# Patient Record
Sex: Female | Born: 1970
Health system: Southern US, Community
[De-identification: ages and names within clinical notes are randomized; demographics above are authoritative.]

## PROBLEM LIST (undated history)

## (undated) DIAGNOSIS — E739 Lactose intolerance, unspecified: Secondary | ICD-10-CM

## (undated) DIAGNOSIS — I10 Essential (primary) hypertension: Secondary | ICD-10-CM

## (undated) DIAGNOSIS — M5136 Other intervertebral disc degeneration, lumbar region: Secondary | ICD-10-CM

## (undated) DIAGNOSIS — K589 Irritable bowel syndrome without diarrhea: Secondary | ICD-10-CM

## (undated) DIAGNOSIS — M51369 Other intervertebral disc degeneration, lumbar region without mention of lumbar back pain or lower extremity pain: Secondary | ICD-10-CM

## (undated) DIAGNOSIS — R0683 Snoring: Secondary | ICD-10-CM

## (undated) DIAGNOSIS — G4700 Insomnia, unspecified: Secondary | ICD-10-CM

## (undated) DIAGNOSIS — K056 Periodontal disease, unspecified: Secondary | ICD-10-CM

## (undated) DIAGNOSIS — M21619 Bunion of unspecified foot: Secondary | ICD-10-CM

## (undated) DIAGNOSIS — M1990 Unspecified osteoarthritis, unspecified site: Secondary | ICD-10-CM

## (undated) DIAGNOSIS — E7800 Pure hypercholesterolemia, unspecified: Secondary | ICD-10-CM

## (undated) DIAGNOSIS — N200 Calculus of kidney: Secondary | ICD-10-CM

## (undated) DIAGNOSIS — M549 Dorsalgia, unspecified: Secondary | ICD-10-CM

## (undated) HISTORY — DX: Other intervertebral disc degeneration, lumbar region without mention of lumbar back pain or lower extremity pain: M51.369

## (undated) HISTORY — DX: Lactose intolerance, unspecified: E73.9

## (undated) HISTORY — DX: Periodontal disease, unspecified: K05.6

## (undated) HISTORY — DX: Dorsalgia, unspecified: M54.9

## (undated) HISTORY — DX: Pure hypercholesterolemia, unspecified: E78.00

## (undated) HISTORY — DX: Irritable bowel syndrome, unspecified: K58.9

## (undated) HISTORY — DX: Insomnia, unspecified: G47.00

## (undated) HISTORY — DX: Snoring: R06.83

## (undated) HISTORY — DX: Unspecified osteoarthritis, unspecified site: M19.90

## (undated) HISTORY — DX: Calculus of kidney: N20.0

## (undated) HISTORY — DX: Bunion of unspecified foot: M21.619

## (undated) HISTORY — DX: Other intervertebral disc degeneration, lumbar region: M51.36

---

## 1997-12-21 HISTORY — PX: BUNIONECTOMY: SHX129

## 1998-12-05 ENCOUNTER — Ambulatory Visit (HOSPITAL_BASED_OUTPATIENT_CLINIC_OR_DEPARTMENT_OTHER): Admission: RE | Admit: 1998-12-05 | Discharge: 1998-12-05 | Payer: Self-pay | Admitting: Orthopedic Surgery

## 1999-11-07 ENCOUNTER — Other Ambulatory Visit: Admission: RE | Admit: 1999-11-07 | Discharge: 1999-11-07 | Payer: Self-pay | Admitting: Family Medicine

## 2000-01-02 ENCOUNTER — Encounter: Admission: RE | Admit: 2000-01-02 | Discharge: 2000-01-29 | Payer: Self-pay | Admitting: Family Medicine

## 2000-04-15 ENCOUNTER — Other Ambulatory Visit: Admission: RE | Admit: 2000-04-15 | Discharge: 2000-04-15 | Payer: Self-pay | Admitting: Obstetrics & Gynecology

## 2000-12-28 ENCOUNTER — Other Ambulatory Visit: Admission: RE | Admit: 2000-12-28 | Discharge: 2000-12-28 | Payer: Self-pay | Admitting: Family Medicine

## 2002-04-27 ENCOUNTER — Other Ambulatory Visit: Admission: RE | Admit: 2002-04-27 | Discharge: 2002-04-27 | Payer: Self-pay | Admitting: Family Medicine

## 2003-04-30 ENCOUNTER — Other Ambulatory Visit: Admission: RE | Admit: 2003-04-30 | Discharge: 2003-04-30 | Payer: Self-pay | Admitting: Family Medicine

## 2004-05-21 ENCOUNTER — Other Ambulatory Visit: Admission: RE | Admit: 2004-05-21 | Discharge: 2004-05-21 | Payer: Self-pay | Admitting: Family Medicine

## 2004-05-23 ENCOUNTER — Encounter: Admission: RE | Admit: 2004-05-23 | Discharge: 2004-05-23 | Payer: Self-pay | Admitting: Family Medicine

## 2004-06-09 ENCOUNTER — Encounter: Admission: RE | Admit: 2004-06-09 | Discharge: 2004-06-09 | Payer: Self-pay | Admitting: Family Medicine

## 2005-11-19 IMAGING — US US RETROPERITONEAL COMPLETE
1 series · 14 of 25 positions shown · non-contrast
Comparison: none

CLINICAL DATA: Microhematuria. 
 RENAL/ULTRASOUND: 
 Both kidneys are within normal limits in size and parenchymal echogenicity. There is no evidence of renal parenchymal lesions. There is no evidence of hydronephrosis.  Right kidney measures 11.4cm long with the left kidney 11.1cm long

 The urinary bladder is unremarkable in appearance for the degree of bladder filling. 
 IMPRESSION
 Normal study.

[Series 1: unknown · 0.26mm/px · 14 of 28 slices shown]
[im 1/28]
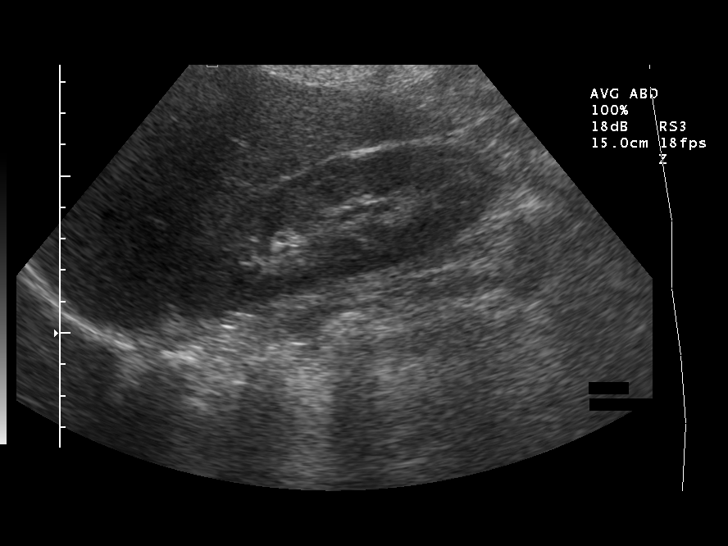
[im 3/28]
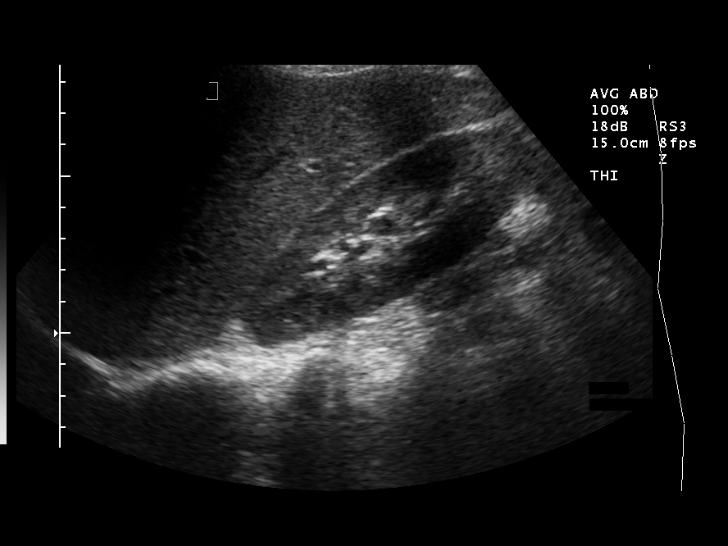
[im 5/28]
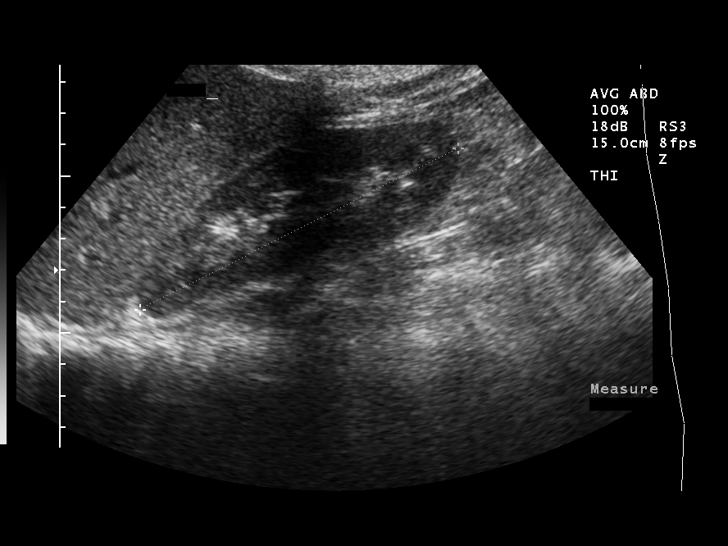
[im 7/28]
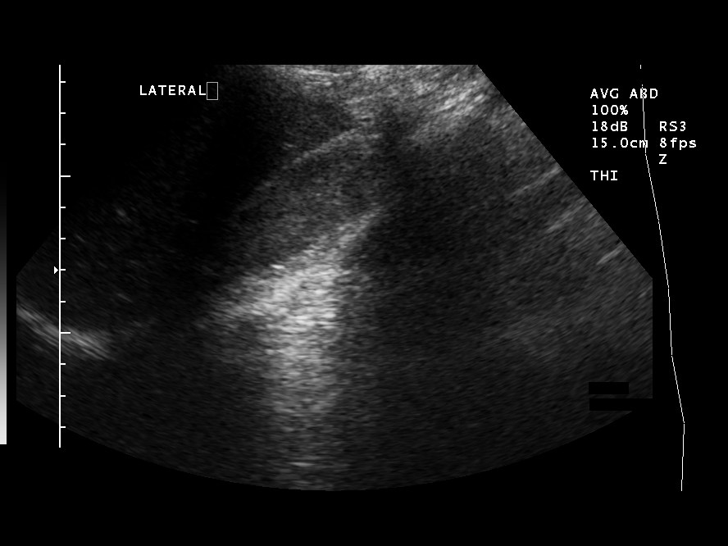
[im 10/28]
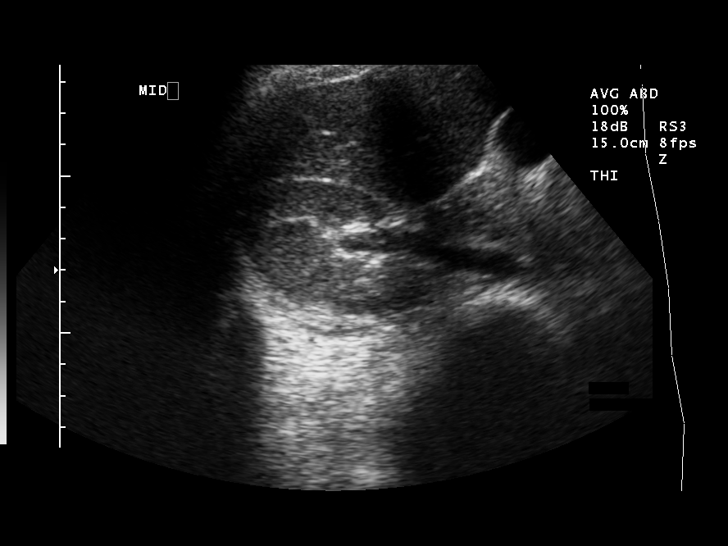
[im 11/28]
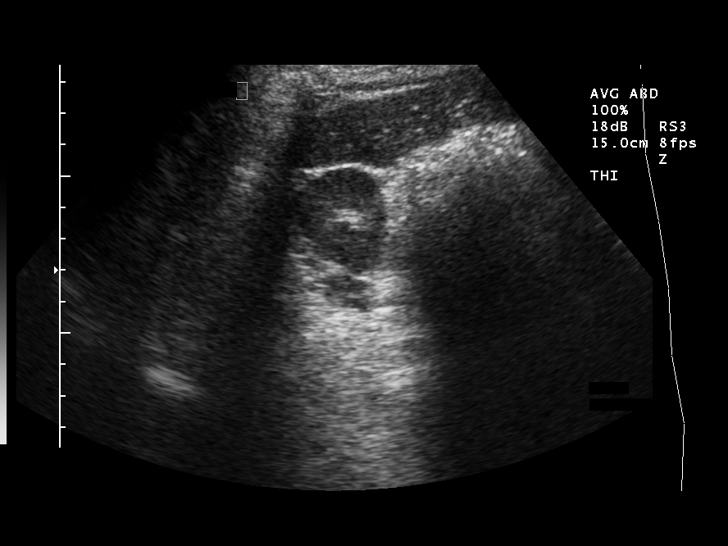
[im 13/28]
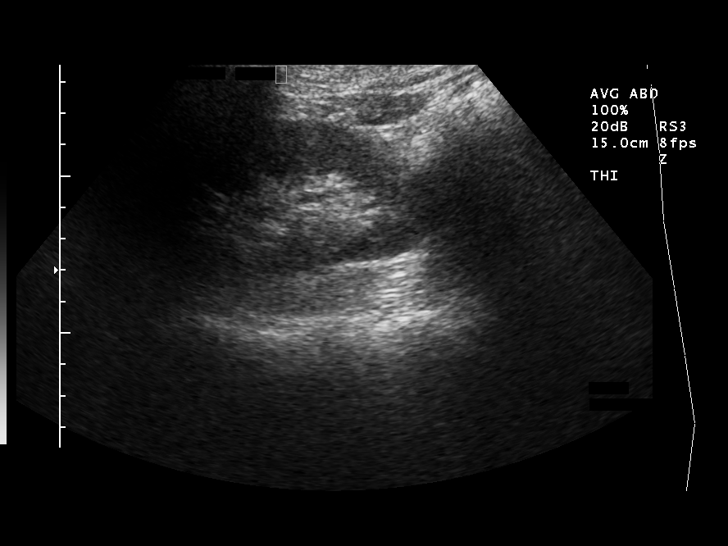
[im 15/28]
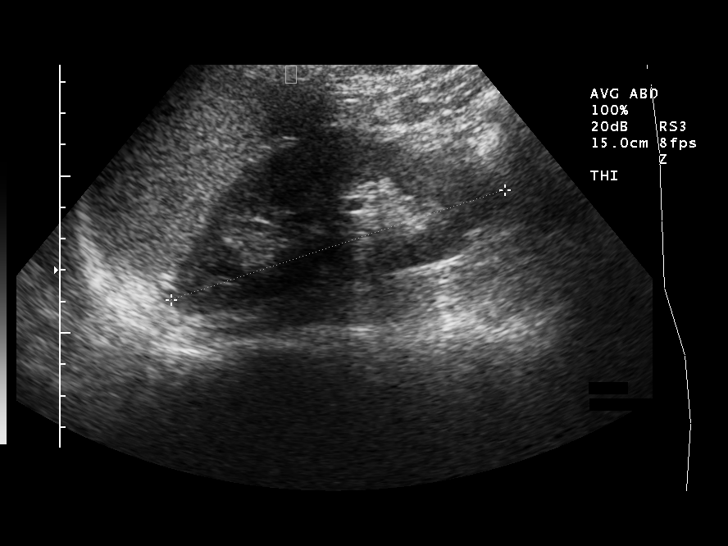
[im 17/28]
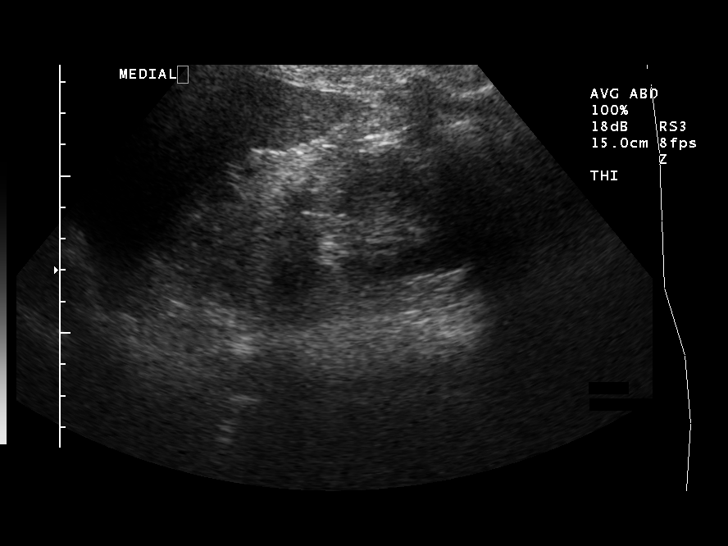
[im 19/28]
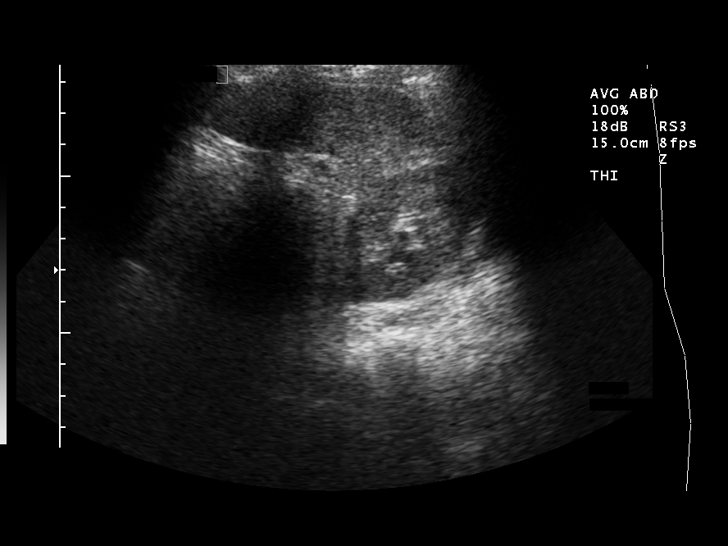
[im 21/28]
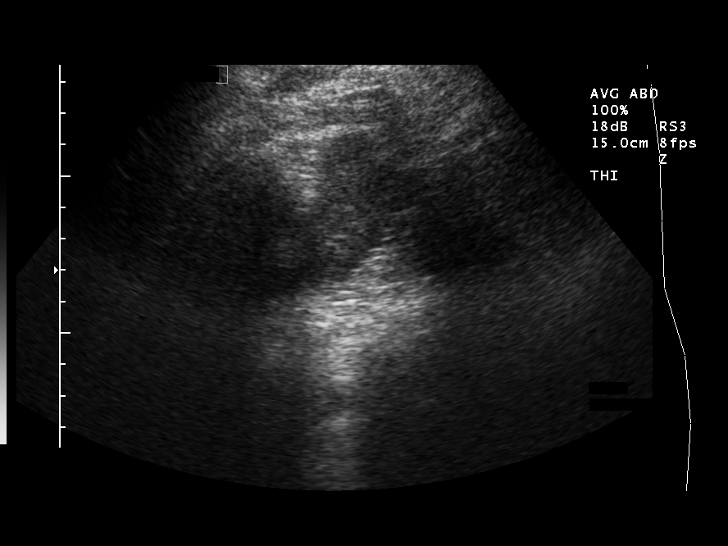
[im 23/28]
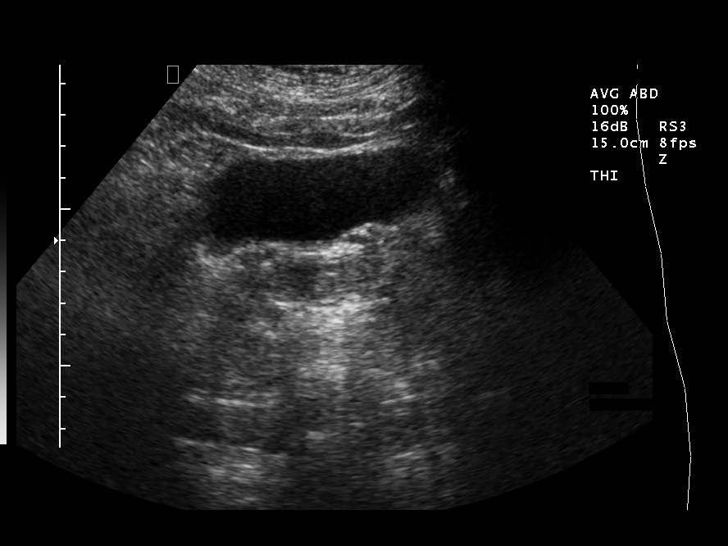
[im 25/28]
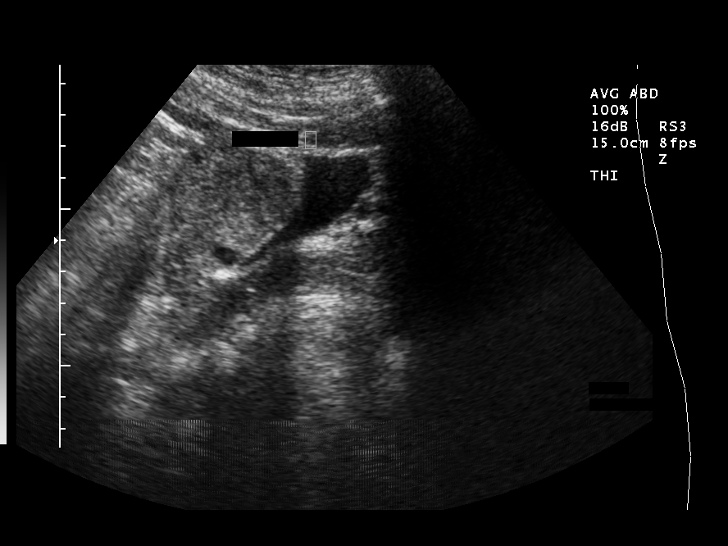
[im 28/28]
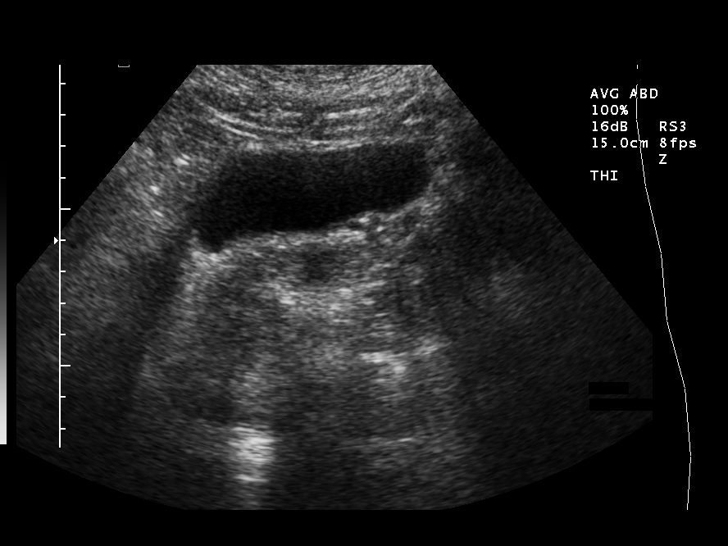

[14 of 25 positions shown; findings below may reference images not displayed]

## 2008-11-05 ENCOUNTER — Encounter (INDEPENDENT_AMBULATORY_CARE_PROVIDER_SITE_OTHER): Payer: Self-pay | Admitting: *Deleted

## 2008-12-31 ENCOUNTER — Other Ambulatory Visit: Admission: RE | Admit: 2008-12-31 | Discharge: 2008-12-31 | Payer: Self-pay | Admitting: Family Medicine

## 2008-12-31 ENCOUNTER — Ambulatory Visit: Payer: Self-pay | Admitting: Family Medicine

## 2008-12-31 ENCOUNTER — Encounter: Payer: Self-pay | Admitting: Family Medicine

## 2008-12-31 DIAGNOSIS — D239 Other benign neoplasm of skin, unspecified: Secondary | ICD-10-CM | POA: Insufficient documentation

## 2008-12-31 LAB — CONVERTED CEMR LAB
Bilirubin Urine: NEGATIVE
Nitrite: NEGATIVE
Protein, U semiquant: NEGATIVE
Urobilinogen, UA: NEGATIVE

## 2009-01-03 ENCOUNTER — Encounter (INDEPENDENT_AMBULATORY_CARE_PROVIDER_SITE_OTHER): Payer: Self-pay | Admitting: *Deleted

## 2009-01-08 ENCOUNTER — Encounter: Payer: Self-pay | Admitting: Family Medicine

## 2009-01-16 ENCOUNTER — Encounter (INDEPENDENT_AMBULATORY_CARE_PROVIDER_SITE_OTHER): Payer: Self-pay | Admitting: *Deleted

## 2009-01-16 LAB — CONVERTED CEMR LAB
ALT: 11 units/L (ref 0–35)
AST: 17 units/L (ref 0–37)
Alkaline Phosphatase: 41 units/L (ref 39–117)
Basophils Absolute: 0 10*3/uL (ref 0.0–0.1)
Bilirubin, Direct: 0.1 mg/dL (ref 0.0–0.3)
CO2: 29 meq/L (ref 19–32)
Chloride: 103 meq/L (ref 96–112)
Cholesterol: 178 mg/dL (ref 0–200)
LDL Cholesterol: 111 mg/dL — ABNORMAL HIGH (ref 0–99)
Lymphocytes Relative: 28.3 % (ref 12.0–46.0)
MCHC: 35.2 g/dL (ref 30.0–36.0)
Neutrophils Relative %: 64.4 % (ref 43.0–77.0)
Potassium: 4.1 meq/L (ref 3.5–5.1)
RBC: 4.09 M/uL (ref 3.87–5.11)
RDW: 11.3 % — ABNORMAL LOW (ref 11.5–14.6)
Sodium: 138 meq/L (ref 135–145)
Total Bilirubin: 0.7 mg/dL (ref 0.3–1.2)
Total CHOL/HDL Ratio: 3.6
Triglycerides: 91 mg/dL (ref 0–149)
VLDL: 18 mg/dL (ref 0–40)

## 2009-02-22 ENCOUNTER — Telehealth (INDEPENDENT_AMBULATORY_CARE_PROVIDER_SITE_OTHER): Payer: Self-pay | Admitting: *Deleted

## 2009-04-03 ENCOUNTER — Encounter: Payer: Self-pay | Admitting: Family Medicine

## 2009-04-18 ENCOUNTER — Encounter: Payer: Self-pay | Admitting: Family Medicine

## 2010-12-31 ENCOUNTER — Other Ambulatory Visit
Admission: RE | Admit: 2010-12-31 | Discharge: 2010-12-31 | Payer: Self-pay | Source: Home / Self Care | Admitting: Family Medicine

## 2010-12-31 ENCOUNTER — Other Ambulatory Visit: Payer: Self-pay | Admitting: Family Medicine

## 2010-12-31 ENCOUNTER — Encounter: Payer: Self-pay | Admitting: Family Medicine

## 2010-12-31 ENCOUNTER — Ambulatory Visit
Admission: RE | Admit: 2010-12-31 | Discharge: 2010-12-31 | Payer: Self-pay | Source: Home / Self Care | Attending: Family Medicine | Admitting: Family Medicine

## 2010-12-31 DIAGNOSIS — M21619 Bunion of unspecified foot: Secondary | ICD-10-CM | POA: Insufficient documentation

## 2010-12-31 DIAGNOSIS — N951 Menopausal and female climacteric states: Secondary | ICD-10-CM | POA: Insufficient documentation

## 2010-12-31 LAB — CBC WITH DIFFERENTIAL/PLATELET
Basophils Absolute: 0.1 10*3/uL (ref 0.0–0.1)
Basophils Relative: 0.7 % (ref 0.0–3.0)
Eosinophils Absolute: 0 10*3/uL (ref 0.0–0.7)
Eosinophils Relative: 0.6 % (ref 0.0–5.0)
HCT: 39.9 % (ref 36.0–46.0)
Hemoglobin: 13.7 g/dL (ref 12.0–15.0)
Lymphocytes Relative: 24.2 % (ref 12.0–46.0)
Lymphs Abs: 1.9 10*3/uL (ref 0.7–4.0)
MCHC: 34.3 g/dL (ref 30.0–36.0)
MCV: 102 fl — ABNORMAL HIGH (ref 78.0–100.0)
Monocytes Absolute: 0.5 10*3/uL (ref 0.1–1.0)
Monocytes Relative: 6.8 % (ref 3.0–12.0)
Neutro Abs: 5.2 10*3/uL (ref 1.4–7.7)
Neutrophils Relative %: 67.7 % (ref 43.0–77.0)
Platelets: 297 10*3/uL (ref 150.0–400.0)
RBC: 3.91 Mil/uL (ref 3.87–5.11)
RDW: 12.3 % (ref 11.5–14.6)
WBC: 7.7 10*3/uL (ref 4.5–10.5)

## 2010-12-31 LAB — BASIC METABOLIC PANEL
BUN: 12 mg/dL (ref 6–23)
CO2: 27 mEq/L (ref 19–32)
Calcium: 9.2 mg/dL (ref 8.4–10.5)
Chloride: 107 mEq/L (ref 96–112)
Creatinine, Ser: 0.8 mg/dL (ref 0.4–1.2)
GFR: 83.16 mL/min (ref >60.00)
Glucose, Bld: 79 mg/dL (ref 70–99)
Potassium: 4.7 mEq/L (ref 3.5–5.1)
Sodium: 139 mEq/L (ref 135–145)

## 2010-12-31 LAB — HEPATIC FUNCTION PANEL
ALT: 11 U/L (ref 0–35)
AST: 17 U/L (ref 0–37)
Albumin: 3.8 g/dL (ref 3.5–5.2)
Alkaline Phosphatase: 49 U/L (ref 39–117)
Bilirubin, Direct: 0.1 mg/dL (ref 0.0–0.3)
Total Bilirubin: 0.8 mg/dL (ref 0.3–1.2)
Total Protein: 6.9 g/dL (ref 6.0–8.3)

## 2010-12-31 LAB — TSH: TSH: 1.55 u[IU]/mL (ref 0.35–5.50)

## 2010-12-31 LAB — LIPID PANEL
Cholesterol: 165 mg/dL (ref 0–200)
HDL: 44 mg/dL (ref >39.00)
LDL Cholesterol: 108 mg/dL — ABNORMAL HIGH (ref 0–99)
Total CHOL/HDL Ratio: 4
Triglycerides: 67 mg/dL (ref 0.0–149.0)
VLDL: 13.4 mg/dL (ref 0.0–40.0)

## 2010-12-31 LAB — FOLLICLE STIMULATING HORMONE: FSH: 3.4 m[IU]/mL

## 2011-01-01 LAB — CONVERTED CEMR LAB: Estradiol: 93.9 pg/mL

## 2011-01-07 ENCOUNTER — Telehealth (INDEPENDENT_AMBULATORY_CARE_PROVIDER_SITE_OTHER): Payer: Self-pay | Admitting: *Deleted

## 2011-01-07 ENCOUNTER — Encounter: Payer: Self-pay | Admitting: Family Medicine

## 2011-01-09 ENCOUNTER — Other Ambulatory Visit: Payer: Self-pay | Admitting: Family Medicine

## 2011-01-09 ENCOUNTER — Ambulatory Visit
Admission: RE | Admit: 2011-01-09 | Discharge: 2011-01-09 | Payer: Self-pay | Source: Home / Self Care | Attending: Family Medicine | Admitting: Family Medicine

## 2011-01-09 LAB — FECAL OCCULT BLOOD, IMMUNOCHEMICAL: Fecal Occult Bld: NEGATIVE

## 2011-01-22 NOTE — Letter (Signed)
Summary: Cancer Screening/[facility] Personalized Risk Profile  Cancer Screening/[facility] Personalized Risk Profile   Imported By: Lanelle Bal 01/07/2011 15:33:29  _____________________________________________________________________  External Attachment:    Type:   Image     Comment:   External Document

## 2011-01-22 NOTE — Letter (Signed)
Summary: [facility]: Immunoassay Fecal Occult Blood (iFOB) Order Form  [facility]  [address] [place], [place] [address]   Phone: [phone]  Fax: [phone]      [facility]: Immunoassay Fecal Occult Blood (iFOB) Order Form   December 31, 2010 MRN: [id number]   [facility] September 24, 1971   Physicican Name: Dr.Lowne  Diagnosis Code: V56.71      Almeta Monas CMA (AAMA)

## 2011-01-22 NOTE — Assessment & Plan Note (Signed)
Summary: CPX//PH   Vital Signs:  Patient profile:   40 year old female Menstrual status:  mirena IUD Height:      65 inches Weight:      160.2 pounds BMI:     26.76 Pulse rate:   71 / minute Pulse rhythm:   regular BP sitting:   144 / 89  (left arm) Cuff size:   regular  Vitals Entered By: Almeta Monas CMA Duncan Dull) (December 31, 2010 8:51 AM) CC: cpx/fasting--wants Lunesta Rx     Menstrual Status mirena IUD Last PAP Result NEGATIVE FOR INTRAEPITHELIAL LESIONS OR MALIGNANCY.   Serial Vital Signs/Assessments:  Time      Position  BP       Pulse  Resp  Temp     By 8:51 AM             140/88                         Almeta Monas CMA (AAMA)   History of Present Illness: Pt here for cpe, labs and pap.  No complaints.    Preventive Screening-Counseling & Management  Alcohol-Tobacco     Alcohol drinks/day: <1     Alcohol type: beer, wine     Smoking Status: quit     Year Quit: 2002     Pack years: 1/2 ppd for 10 years     Passive Smoke Exposure: no  Caffeine-Diet-Exercise     Caffeine use/day: 1     Does Patient Exercise: yes     Type of exercise: aerobic     Exercise (avg: min/session): 30-60     Times/week: 5  Hep-HIV-STD-Contraception     HIV Risk: no     Dental Visit-last 6 months yes     Dental Care Counseling: not indicated; dental care within six months     SBE monthly: yes     SBE Education/Counseling: not indicated; SBE done regularly     Sun Exposure-Excessive: occasionally  Safety-Violence-Falls     Seat Belt Use: 100      Sexual History:  currently monogamous.        Drug Use:  never.    Problems Prior to Update: 1)  Hot Flashes  (ICD-627.2) 2)  Bunion, Right Foot  (ICD-727.1) 3)  Preventive Health Care  (ICD-V70.0) 4)  Nevi, Multiple  (ICD-216.9)  Medications Prior to Update: 1)  Lunesta 3 Mg Tabs (Eszopiclone) .Marland Kitchen.. 1 By Mouth At Bedtime As Needed 2)  Mirena 20 Mcg/24hr Iud (Levonorgestrel)  Current Medications (verified): 1)  Mirena 20  Mcg/24hr Iud (Levonorgestrel) 2)  Lunesta 3 Mg Tabs (Eszopiclone) .Marland Kitchen.. 1 By Mouth At Bedtime As Needed  Allergies (verified): 1)  ! Tetracycline 2)  ! 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 HTN-mother, father, brother Remo Lipps, paternal GF  Social History: Reviewed history from 12/31/2008 and no changes required. 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 Eyes:  Denies blurring, discharge, double vision, eye irritation, eye pain, halos, itching, light sensitivity, red eye, vision loss-1 eye, and vision loss-both eyes; optho q1y. ENT:  Denies decreased hearing, difficulty swallowing, ear discharge, earache, hoarseness, nasal congestion, nosebleeds, postnasal drainage, ringing in ears, sinus pressure, and sore throat. CV:  Denies bluish discoloration of lips or nails, chest pain or discomfort, difficulty breathing at night, difficulty breathing while lying down, fainting, fatigue,  leg cramps with exertion, lightheadness, near fainting, palpitations, shortness of breath with exertion, swelling of feet, swelling of hands, and weight gain. Resp:  Denies chest discomfort, chest pain with inspiration, cough, coughing up blood, excessive snoring, hypersomnolence, morning headaches, pleuritic, shortness of breath, sputum productive, and wheezing. GI:  Denies abdominal pain, bloody stools, change in bowel habits, constipation, dark tarry stools, diarrhea, excessive appetite, gas, hemorrhoids, indigestion, loss of appetite, nausea, vomiting, vomiting blood, and yellowish skin color. GU:  Denies abnormal vaginal bleeding, decreased libido, discharge, dysuria, genital sores, hematuria, incontinence, nocturia, urinary frequency, and urinary hesitancy. MS:  Denies joint pain, joint redness, joint swelling, loss of strength, low back pain, mid back pain, muscle aches, muscle , cramps, muscle weakness, stiffness, and thoracic pain. Derm:  Denies changes in color of skin, changes in nail beds, dryness, excessive perspiration, flushing, hair loss, insect bite(s), itching, lesion(s), poor wound healing, and rash. Neuro:  Denies brief paralysis, difficulty with concentration, disturbances in coordination, falling down, headaches, inability to speak, memory loss, numbness, poor balance, seizures, sensation of room spinning, tingling, tremors, visual disturbances, and weakness. Psych:  Denies alternate hallucination ( auditory/visual), anxiety, depression, easily angered, easily tearful, irritability, mental problems, panic attacks, sense of great danger, suicidal thoughts/plans, thoughts of violence, unusual visions or sounds, and thoughts /plans of harming others. Endo:  Denies cold intolerance, excessive hunger, excessive thirst, excessive urination, heat intolerance, polyuria, and weight change. Heme:  Denies abnormal bruising, bleeding, enlarge lymph nodes, fevers, pallor, and skin  discoloration. Allergy:  Denies hives or rash, itching eyes, persistent infections, seasonal allergies, and sneezing.  Physical Exam  General:  Well-developed,well-nourished,in no acute distress; alert,appropriate and cooperative throughout examination Head:  Normocephalic and atraumatic without obvious abnormalities. No apparent alopecia or balding. Eyes:  vision grossly intact, pupils equal, pupils round, and pupils reactive to light.   Ears:  External ear exam shows no significant lesions or deformities.  Otoscopic examination reveals clear canals, tympanic membranes are intact bilaterally without bulging, retraction, inflammation or discharge. Hearing is grossly normal bilaterally. Nose:  External nasal examination shows no deformity or inflammation. Nasal mucosa are pink and moist without lesions or exudates. Mouth:  Oral mucosa and oropharynx without lesions or exudates.  Teeth in good repair. Neck:  No deformities, masses, or tenderness noted. Chest Wall:  No deformities, masses, or tenderness noted. Breasts:  No mass, nodules, thickening, tenderness, bulging, retraction, inflamation, nipple discharge or skin changes noted.   Lungs:  Normal respiratory effort, chest expands symmetrically. Lungs are clear to auscultation, no crackles or wheezes. Heart:  normal rate and no murmur.   Abdomen:  Bowel sounds positive,abdomen soft and non-tender without masses, organomegaly or hernias noted. Rectal:  No external abnormalities noted. Normal sphincter  tone. No rectal masses or tenderness.  heme neg brown stool Genitalia:  Pelvic Exam:        External: normal female genitalia without lesions or masses        Vagina: normal without lesions or masses        Cervix: normal without lesions or masses        Adnexa: normal bimanual exam without masses or fullness        Uterus: normal by palpation        Pap smear: performed Msk:  normal ROM, no joint tenderness, no joint swelling, no joint warmth,  no redness over joints, no joint deformities, no joint instability, and no crepitation.   Pulses:  R and L carotid,radial,femoral,dorsalis pedis and posterior tibial pulses are full and equal bilaterally Extremities:  R foot + bunion Neurologic:  No cranial nerve deficits noted. Station and gait are normal. Plantar reflexes are down-going bilaterally. DTRs are symmetrical throughout. Sensory, motor and coordinative functions appear intact. Skin:  Intact without suspicious lesions or rashes Cervical Nodes:  No lymphadenopathy noted Axillary Nodes:  No palpable lymphadenopathy Psych:  Cognition and judgment appear intact. Alert and cooperative with normal attention span and concentration. No apparent delusions, illusions, hallucinations   Impression & Recommendations:  Problem # 1:  PREVENTIVE HEALTH CARE (ICD-V70.0) ghm utd  Orders: Venipuncture (65784) TLB-Lipid Panel (80061-LIPID) TLB-BMP (Basic Metabolic Panel-BMET) (80048-METABOL) TLB-CBC Platelet - w/Differential (85025-CBCD) TLB-Hepatic/Liver Function Pnl (80076-HEPATIC) TLB-TSH (Thyroid Stimulating Hormone) (84443-TSH) TLB-FSH (Follicle Stimulating Hormone) (83001-FSH) T- * Misc. Laboratory test [phone]) Specimen Handling (52841) EKG w/ Interpretation (93000)  Problem # 2:  BUNION, RIGHT FOOT (ICD-727.1)  Orders: Orthopedic Surgeon Referral (Ortho Surgeon)  Problem # 3:  HOT FLASHES (ICD-627.2)  Orders: TLB-FSH (Follicle Stimulating Hormone) (83001-FSH) T- * Misc. Laboratory test [phone]) Specimen Handling (10272)  Discussed treatment options.   Complete Medication List: 1)  Mirena 20 Mcg/24hr Iud (Levonorgestrel) 2)  Lunesta 3 Mg Tabs (Eszopiclone) .Marland Kitchen.. 1 by mouth at bedtime as needed  Patient Instructions: 1)  Limit your Sodium(salt) .  2)  Please schedule a follow-up appointment in 3 months .  Prescriptions: LUNESTA 3 MG TABS (ESZOPICLONE) 1 by mouth at bedtime as needed  #30 x 0   Entered and Authorized by:    Loreen Freud DO   Signed by:   Loreen Freud DO on 12/31/2010   Method used:   Print then Give to Patient   RxID:   [id number]    Orders Added: 1)  Orthopedic Surgeon Referral [Ortho Surgeon] 2)  Venipuncture [38756] 3)  TLB-Lipid Panel [80061-LIPID] 4)  TLB-BMP (Basic Metabolic Panel-BMET) [80048-METABOL] 5)  TLB-CBC Platelet - w/Differential [85025-CBCD] 6)  TLB-Hepatic/Liver Function Pnl [80076-HEPATIC] 7)  TLB-TSH (Thyroid Stimulating Hormone) [84443-TSH] 8)  TLB-FSH (Follicle Stimulating Hormone) [83001-FSH] 9)  T- * Misc. Laboratory test [99999] 10)  Specimen Handling [99000] 11)  Est. Patient 40-64 years [99396] 12)  EKG w/ Interpretation [93000]    Flu Vaccine Next Due:  Refused

## 2011-01-22 NOTE — Progress Notes (Signed)
Summary: ? lab results   Phone Note Call from Patient Call back at Home Phone [phone]   Caller: Patient Summary of Call: Pt would like to know if her level of Estradiol indicate if she is perimenopausal. Pls advise.............Marland KitchenFelecia Deloach CMA  January 07, 2011 3:11 PM   Follow-up for Phone Call        No---there is really no blood test to tell  us if she is perimenopausal--- estradial level showed midcycle Follow-up by: Loreen Freud DO,  January 07, 2011 8:39 PM  Additional Follow-up for Phone Call Additional follow up Details #1::        Left message to call office....Marland KitchenMarland KitchenFelecia Deloach CMA  January 08, 2011 10:29 AM     Additional Follow-up for Phone Call Additional follow up Details #2::    left message for pt to call back. Army Fossa CMA  January 09, 2011 1:27 PM Left message to call office................Marland KitchenFelecia Deloach CMA  January 12, 2011 2:07 PM    Pt return call left VM that she found out what labs meant and no longer needs our assistant but thanked [place] for our efforts. Per pt no need to return call unless info is different from what she left on VM........Marland KitchenFelecia Deloach CMA  January 13, 2011 8:49 AM

## 2011-01-22 NOTE — Letter (Signed)
Summary: Results Follow-up Letter  [place] [facility]  [address] [place], [place] [address]   Phone: [phone]  Fax: [phone]      01/07/2011    Ashlee Swanson [address] [place], [place]  [address]     Dear [name],     The following are the results of your recent test(s):  Test     Result     Pap Smear    Normal___X____  Not Normal_____              Sincerely,  Almeta Monas CMA (AAMA) [place] [facility]

## 2011-01-27 ENCOUNTER — Encounter: Payer: Self-pay | Admitting: Family Medicine

## 2012-08-25 ENCOUNTER — Telehealth: Payer: Self-pay | Admitting: Family Medicine

## 2012-08-25 NOTE — Telephone Encounter (Signed)
Pt wants mirena IUD replaced during a physical, is this something we are still doing? Last cpe 1.11.12-mirena IUD inserted at this appt If so do I need to advise so we can order? Is the appt time longer than normal? Please advise so I can call pt back  Cb# [phone]

## 2012-08-25 NOTE — Telephone Encounter (Signed)
We do not insert the mirena here anymore and the Mirena is good for 5 years, so unless she just wants it removed she can leave it in.    [place]

## 2012-08-25 NOTE — Telephone Encounter (Signed)
Called pt on # given ABM LM to call back

## 2012-08-26 NOTE — Telephone Encounter (Signed)
Patient returned our call advised patient per note below from CMA, pt stated she would call a GYN covered by her insurance and go that route as she wants her Mirena replaced

## 2015-05-28 ENCOUNTER — Encounter: Payer: Self-pay | Admitting: Family Medicine

## 2015-05-28 ENCOUNTER — Ambulatory Visit (INDEPENDENT_AMBULATORY_CARE_PROVIDER_SITE_OTHER): Payer: 59 | Admitting: Family Medicine

## 2015-05-28 VITALS — BP 120/76 | HR 80 | Temp 97.8°F | Ht 64.5 in | Wt 190.4 lb

## 2015-05-28 DIAGNOSIS — G4700 Insomnia, unspecified: Secondary | ICD-10-CM | POA: Diagnosis not present

## 2015-05-28 DIAGNOSIS — M2011 Hallux valgus (acquired), right foot: Secondary | ICD-10-CM | POA: Diagnosis not present

## 2015-05-28 DIAGNOSIS — M25551 Pain in right hip: Secondary | ICD-10-CM | POA: Diagnosis not present

## 2015-05-28 DIAGNOSIS — R229 Localized swelling, mass and lump, unspecified: Secondary | ICD-10-CM

## 2015-05-28 DIAGNOSIS — R220 Localized swelling, mass and lump, head: Secondary | ICD-10-CM

## 2015-05-28 DIAGNOSIS — M21611 Bunion of right foot: Secondary | ICD-10-CM

## 2015-05-28 MED ORDER — MELOXICAM 15 MG PO TABS: ORAL_TABLET | ORAL | Status: DC

## 2015-05-28 MED ORDER — ESZOPICLONE 3 MG PO TABS: 3.0000 mg | ORAL_TABLET | Freq: Every evening | ORAL | Status: DC | PRN

## 2015-05-28 NOTE — Patient Instructions (Signed)
Hip Pain Your hip is the joint between your upper legs and your lower pelvis. The bones, cartilage, tendons, and muscles of your hip joint perform a lot of work each day supporting your body weight and allowing you to move around. Hip pain can range from a minor ache to severe pain in one or both of your hips. Pain may be felt on the inside of the hip joint near the groin, or the outside near the buttocks and upper thigh. You may have swelling or stiffness as well.  HOME CARE INSTRUCTIONS   Take medicines only as directed by your health care provider.  Apply ice to the injured area:  Put ice in a plastic bag.  Place a towel between your skin and the bag.  Leave the ice on for 15-20 minutes at a time, 3-4 times a day.  Keep your leg raised (elevated) when possible to lessen swelling.  Avoid activities that cause pain.  Follow specific exercises as directed by your health care provider.  Sleep with a pillow between your legs on your most comfortable side.  Record how often you have hip pain, the location of the pain, and what it feels like. SEEK MEDICAL CARE IF:   You are unable to put weight on your leg.  Your hip is red or swollen or very tender to touch.  Your pain or swelling continues or worsens after 1 week.  You have increasing difficulty walking.  You have a fever. SEEK IMMEDIATE MEDICAL CARE IF:   You have fallen.  You have a sudden increase in pain and swelling in your hip. MAKE SURE YOU:   Understand these instructions.  Will watch your condition.  Will get help right away if you are not doing well or get worse. Document Released: 05/27/2010 Document Revised: 04/23/2014 Document Reviewed: 08/03/2013 ExitCare Patient Information 2015 [employer]. This information is not intended to replace advice given to you by your health care provider. Make sure you discuss any questions you have with your health care provider.  

## 2015-05-28 NOTE — Progress Notes (Signed)
Pre visit review using our clinic review tool, if applicable. No additional management support is needed unless otherwise documented below in the visit note. 

## 2015-05-28 NOTE — Assessment & Plan Note (Signed)
Some because of pain--- refill lunesta Instructed pt to try to not take it q day Consider sleep study

## 2015-05-28 NOTE — Progress Notes (Signed)
Patient ID: Ashlee Swanson, female    DOB: [birth year]  Age: 44 y.o. MRN: [id number]    Subjective:  Subjective HPI Ashlee Swanson presents with c/o foot and hip pain on Right.  The foot pain has been going on for years and the hip pain developed over last 2 years and is worsening.  No injury.   She also c/o mass on scalp that has increased in size over last few years.  Not painful, no drainage.   Pt is also not sleeping mostly secondary.      Review of Systems  Constitutional: Negative for activity change, appetite change, fatigue and unexpected weight change.  Respiratory: Negative for cough and shortness of breath.   Cardiovascular: Negative for chest pain and palpitations.  Musculoskeletal:       + right foot and hip pain  Skin:       Mass on scalp -- inc in size  Psychiatric/Behavioral: Negative for behavioral problems and dysphoric mood. The patient is not nervous/anxious.     History Past Medical History  Diagnosis Date  . Arthritis     In the Back    She has past surgical history that includes Bunionectomy (Left, 1999).   Her family history includes Breast cancer in her maternal aunt; Colon cancer in her paternal aunt; Heart disease in an other family member; Hypertension in her brother, father, and mother; Stroke in her maternal grandmother and paternal grandfather.She reports that she has quit smoking. She does not have any smokeless tobacco history on file. Her alcohol and drug histories are not on file.  No current outpatient prescriptions on file prior to visit.   No current facility-administered medications on file prior to visit.     Objective:  Objective Physical Exam  Constitutional: She is oriented to person, place, and time. She appears well-developed and well-nourished.  HENT:  Head: Normocephalic and atraumatic.  Eyes: Conjunctivae and EOM are normal.  Neck: Normal range of motion. Neck supple. No JVD present. Carotid bruit is not  present. No thyromegaly present.  Cardiovascular: Normal rate, regular rhythm and normal heart sounds.   No murmur heard. Pulmonary/Chest: Effort normal and breath sounds normal. No respiratory distress. She has no wheezes. She has no rales. She exhibits no tenderness.  Musculoskeletal: She exhibits no edema.  Bunion R foot r hip -- full ROM, no  Pain with palpation  Neurological: She is alert and oriented to person, place, and time. She has normal reflexes. She displays normal reflexes. No cranial nerve deficit. She exhibits normal muscle tone. Coordination normal.  Skin:  Soft , rubbery mass back of head --- about 2 in diameter  Psychiatric: She has a normal mood and affect. Her behavior is normal.   BP 120/76 mmHg  Pulse 80  Temp(Src) 97.8 F (36.6 C) (Oral)  Ht 5' 4.5" (1.638 m)  Wt 190 lb 6.4 oz (86.365 kg)  BMI 32.19 kg/m2  SpO2 98%  LMP  (Exact Date) Wt Readings from Last 3 Encounters:  05/28/15 190 lb 6.4 oz (86.365 kg)  12/31/10 160 lb 3.2 oz (72.666 kg)  12/31/08 177 lb 6.4 oz (80.468 kg)     Lab Results  Component Value Date   WBC 7.7 12/31/2010   HGB 13.7 12/31/2010   HCT 39.9 12/31/2010   PLT 297.0 12/31/2010   GLUCOSE 79 12/31/2010   CHOL 165 12/31/2010   TRIG 67.0 12/31/2010   HDL 44.00 12/31/2010   LDLCALC 108* 12/31/2010   ALT 11 12/31/2010  AST 17 12/31/2010   NA 139 12/31/2010   K 4.7 12/31/2010   CL 107 12/31/2010   CREATININE 0.8 12/31/2010   BUN 12 12/31/2010   CO2 27 12/31/2010   TSH 1.55 12/31/2010    No results found.   Assessment & Plan:  Plan I have changed [name]'s Eszopiclone. I am also having her start on meloxicam.  Meds ordered this encounter  Medications  . DISCONTD: Eszopiclone 3 MG TABS    Sig: Take 3 mg by mouth at bedtime as needed.    Refill:  2  . meloxicam (MOBIC) 15 MG tablet    Sig: 1/2 -1 po qd prn pain    Dispense:  30 tablet    Refill:  2  . Eszopiclone 3 MG TABS    Sig: Take 1 tablet (3 mg  total) by mouth at bedtime as needed.    Dispense:  30 tablet    Refill:  2    Problem List Items Addressed This Visit    Insomnia    Some because of pain--- refill lunesta Instructed pt to try to not take it q day Consider sleep study      Relevant Medications   Eszopiclone 3 MG TABS    Other Visit Diagnoses    Mass of scalp    -  Primary    Relevant Orders    Ambulatory referral to General Surgery    Bunion, right foot        Relevant Medications    meloxicam (MOBIC) 15 MG tablet    Other Relevant Orders    Ambulatory referral to Orthopedic Surgery    Right hip pain        Relevant Medications    meloxicam (MOBIC) 15 MG tablet    Other Relevant Orders    Ambulatory referral to Orthopedic Surgery       Follow-up: Return in about 1 year (around 05/27/2016) for annual exam, fasting.  Garnet Koyanagi, DO

## 2016-01-17 ENCOUNTER — Other Ambulatory Visit: Payer: Self-pay | Admitting: Family Medicine

## 2016-01-17 ENCOUNTER — Telehealth: Payer: Self-pay | Admitting: Family Medicine

## 2016-01-17 DIAGNOSIS — G4700 Insomnia, unspecified: Secondary | ICD-10-CM

## 2016-01-17 DIAGNOSIS — D229 Melanocytic nevi, unspecified: Secondary | ICD-10-CM

## 2016-01-17 MED ORDER — ESZOPICLONE 3 MG PO TABS: 3.0000 mg | ORAL_TABLET | Freq: Every evening | ORAL | Status: DC | PRN

## 2016-01-17 NOTE — Addendum Note (Signed)
Addended by: Ewing Schlein on: 01/17/2016 04:54 PM   Modules accepted: Orders

## 2016-01-17 NOTE — Telephone Encounter (Signed)
Ok to refill x1-- 2 refills Is the derm referral for the mass on scalp?  Need diagnosis than can put referral in

## 2016-01-17 NOTE — Telephone Encounter (Signed)
Please advise patient needs refill on rx. And would like dermatology referral.

## 2016-01-17 NOTE — Telephone Encounter (Signed)
Rx faxed. Her reason is below. Please advise on referral..   KP

## 2016-01-17 NOTE — Telephone Encounter (Signed)
Relation to [id number] Call back number: [phone] Pharmacy: [id number] - [place], [place] [phone] (Phone) [phone] (Fax)         Reason for call:  Patient requesting a refill Eszopiclone 3 MG TABS and requesting a dermatologist referral due to a "sun spot" on right  breast and it seems as if it doubled

## 2016-01-17 NOTE — Telephone Encounter (Signed)
Last seen and filled 05/28/15 #30 with 2 refill   Please advise    KP

## 2016-05-29 ENCOUNTER — Encounter: Payer: Self-pay | Admitting: Family Medicine

## 2016-05-29 ENCOUNTER — Ambulatory Visit (INDEPENDENT_AMBULATORY_CARE_PROVIDER_SITE_OTHER): Payer: 59 | Admitting: Family Medicine

## 2016-05-29 ENCOUNTER — Other Ambulatory Visit (HOSPITAL_COMMUNITY)
Admission: RE | Admit: 2016-05-29 | Discharge: 2016-05-29 | Disposition: A | Discharge status: Home / Self Care | Payer: 59 | Source: Ambulatory Visit | Attending: Family Medicine | Admitting: Family Medicine

## 2016-05-29 VITALS — BP 120/70 | HR 65 | Temp 98.2°F | Wt 183.2 lb

## 2016-05-29 DIAGNOSIS — Z0000 Encounter for general adult medical examination without abnormal findings: Secondary | ICD-10-CM

## 2016-05-29 DIAGNOSIS — G4700 Insomnia, unspecified: Secondary | ICD-10-CM

## 2016-05-29 DIAGNOSIS — M47819 Spondylosis without myelopathy or radiculopathy, site unspecified: Secondary | ICD-10-CM

## 2016-05-29 DIAGNOSIS — Z1151 Encounter for screening for human papillomavirus (HPV): Secondary | ICD-10-CM | POA: Insufficient documentation

## 2016-05-29 DIAGNOSIS — Z01419 Encounter for gynecological examination (general) (routine) without abnormal findings: Secondary | ICD-10-CM | POA: Insufficient documentation

## 2016-05-29 DIAGNOSIS — R319 Hematuria, unspecified: Secondary | ICD-10-CM

## 2016-05-29 DIAGNOSIS — Z124 Encounter for screening for malignant neoplasm of cervix: Secondary | ICD-10-CM

## 2016-05-29 LAB — POCT URINALYSIS DIPSTICK
Bilirubin, UA: NEGATIVE
Glucose, UA: NEGATIVE
Ketones, UA: NEGATIVE
LEUKOCYTES UA: NEGATIVE
NITRITE UA: NEGATIVE
PH UA: 7
PROTEIN UA: NEGATIVE
Spec Grav, UA: 1.015
UROBILINOGEN UA: 0.2

## 2016-05-29 LAB — COMPREHENSIVE METABOLIC PANEL
ALT: 13 U/L (ref 0–35)
AST: 14 U/L (ref 0–37)
Albumin: 4.1 g/dL (ref 3.5–5.2)
Alkaline Phosphatase: 42 U/L (ref 39–117)
BUN: 11 mg/dL (ref 6–23)
CO2: 27 mEq/L (ref 19–32)
Calcium: 8.9 mg/dL (ref 8.4–10.5)
Chloride: 104 mEq/L (ref 96–112)
Creatinine, Ser: 0.87 mg/dL (ref 0.40–1.20)
GFR: 74.64 mL/min (ref >60.00)
Glucose, Bld: 91 mg/dL (ref 70–99)
Potassium: 3.9 mEq/L (ref 3.5–5.1)
Sodium: 137 mEq/L (ref 135–145)
Total Bilirubin: 0.5 mg/dL (ref 0.2–1.2)
Total Protein: 7.1 g/dL (ref 6.0–8.3)

## 2016-05-29 LAB — LIPID PANEL
Cholesterol: 161 mg/dL (ref 0–200)
HDL: 45.2 mg/dL
LDL Cholesterol: 96 mg/dL (ref 0–99)
NonHDL: 115.91
Total CHOL/HDL Ratio: 4
Triglycerides: 99 mg/dL (ref 0.0–149.0)
VLDL: 19.8 mg/dL (ref 0.0–40.0)

## 2016-05-29 LAB — CBC WITH DIFFERENTIAL/PLATELET
Basophils Absolute: 0 10*3/uL (ref 0.0–0.1)
Basophils Relative: 0.6 % (ref 0.0–3.0)
Eosinophils Absolute: 0 10*3/uL (ref 0.0–0.7)
Eosinophils Relative: 0.5 % (ref 0.0–5.0)
HCT: 40.5 % (ref 36.0–46.0)
Hemoglobin: 13.6 g/dL (ref 12.0–15.0)
Lymphocytes Relative: 32.8 % (ref 12.0–46.0)
Lymphs Abs: 2.8 10*3/uL (ref 0.7–4.0)
MCHC: 33.6 g/dL (ref 30.0–36.0)
MCV: 99.3 fl (ref 78.0–100.0)
Monocytes Absolute: 0.6 10*3/uL (ref 0.1–1.0)
Monocytes Relative: 6.7 % (ref 3.0–12.0)
Neutro Abs: 5 10*3/uL (ref 1.4–7.7)
Neutrophils Relative %: 59.4 % (ref 43.0–77.0)
Platelets: 306 10*3/uL (ref 150.0–400.0)
RBC: 4.08 Mil/uL (ref 3.87–5.11)
RDW: 12.4 % (ref 11.5–15.5)
WBC: 8.4 10*3/uL (ref 4.0–10.5)

## 2016-05-29 LAB — TSH: TSH: 1.57 u[IU]/mL (ref 0.35–4.50)

## 2016-05-29 MED ORDER — ESZOPICLONE 3 MG PO TABS: 3.0000 mg | ORAL_TABLET | Freq: Every evening | ORAL | Status: DC | PRN

## 2016-05-29 MED ORDER — DICLOFENAC 35 MG PO CAPS: ORAL_CAPSULE | ORAL | Status: DC

## 2016-05-29 NOTE — Patient Instructions (Addendum)
Preventive Care for Adults, Female A healthy lifestyle and preventive care can promote health and wellness. Preventive health guidelines for women include the following key practices.  A routine yearly physical is a good way to check with your health care provider about your health and preventive screening. It is a chance to share any concerns and updates on your health and to receive a thorough exam.  Visit your dentist for a routine exam and preventive care every 6 months. Brush your teeth twice a day and floss once a day. Good oral hygiene prevents tooth decay and gum disease.  The frequency of eye exams is based on your age, health, family medical history, use of contact lenses, and other factors. Follow your health care provider's recommendations for frequency of eye exams.  Eat a healthy diet. Foods like vegetables, fruits, whole grains, low-fat dairy products, and lean protein foods contain the nutrients you need without too many calories. Decrease your intake of foods high in solid fats, added sugars, and salt. Eat the right amount of calories for you.Get information about a proper diet from your health care provider, if necessary.  Regular physical exercise is one of the most important things you can do for your health. Most adults should get at least 150 minutes of moderate-intensity exercise (any activity that increases your heart rate and causes you to sweat) each week. In addition, most adults need muscle-strengthening exercises on 2 or more days a week.  Maintain a healthy weight. The body mass index (BMI) is a screening tool to identify possible weight problems. It provides an estimate of body fat based on height and weight. Your health care provider can find your BMI and can help you achieve or maintain a healthy weight.For adults 20 years and older:  A BMI below 18.5 is considered underweight.  A BMI of 18.5 to 24.9 is normal.  A BMI of 25 to 29.9 is considered overweight.  A  BMI of 30 and above is considered obese.  Maintain normal blood lipids and cholesterol levels by exercising and minimizing your intake of saturated fat. Eat a balanced diet with plenty of fruit and vegetables. Blood tests for lipids and cholesterol should begin at age 45 and be repeated every 5 years. If your lipid or cholesterol levels are high, you are over 50, or you are at high risk for heart disease, you may need your cholesterol levels checked more frequently.Ongoing high lipid and cholesterol levels should be treated with medicines if diet and exercise are not working.  If you smoke, find out from your health care provider how to quit. If you do not use tobacco, do not start.  Lung cancer screening is recommended for adults aged 45-80 years who are at high risk for developing lung cancer because of a history of smoking. A yearly low-dose CT scan of the lungs is recommended for people who have at least a 30-pack-year history of smoking and are a current smoker or have quit within the past 15 years. A pack year of smoking is smoking an average of 1 pack of cigarettes a day for 1 year (for example: 1 pack a day for 30 years or 2 packs a day for 15 years). Yearly screening should continue until the smoker has stopped smoking for at least 15 years. Yearly screening should be stopped for people who develop a health problem that would prevent them from having lung cancer treatment.  If you are pregnant, do not drink alcohol. If you are  breastfeeding, be very cautious about drinking alcohol. If you are not pregnant and choose to drink alcohol, do not have more than 1 drink per day. One drink is considered to be 12 ounces (355 mL) of beer, 5 ounces (148 mL) of wine, or 1.5 ounces (44 mL) of liquor.  Avoid use of street drugs. Do not share needles with anyone. Ask for help if you need support or instructions about stopping the use of drugs.  High blood pressure causes heart disease and increases the risk  of stroke. Your blood pressure should be checked at least every 1 to 2 years. Ongoing high blood pressure should be treated with medicines if weight loss and exercise do not work.  If you are 55-79 years old, ask your health care provider if you should take aspirin to prevent strokes.  Diabetes screening is done by taking a blood sample to check your blood glucose level after you have not eaten for a certain period of time (fasting). If you are not overweight and you do not have risk factors for diabetes, you should be screened once every 3 years starting at age 45. If you are overweight or obese and you are 40-70 years of age, you should be screened for diabetes every year as part of your cardiovascular risk assessment.  Breast cancer screening is essential preventive care for women. You should practice "breast self-awareness." This means understanding the normal appearance and feel of your breasts and may include breast self-examination. Any changes detected, no matter how small, should be reported to a health care provider. Women in their 20s and 30s should have a clinical breast exam (CBE) by a health care provider as part of a regular health exam every 1 to 3 years. After age 40, women should have a CBE every year. Starting at age 40, women should consider having a mammogram (breast X-ray test) every year. Women who have a family history of breast cancer should talk to their health care provider about genetic screening. Women at a high risk of breast cancer should talk to their health care providers about having an MRI and a mammogram every year.  Breast cancer gene (BRCA)-related cancer risk assessment is recommended for women who have family members with BRCA-related cancers. BRCA-related cancers include breast, ovarian, tubal, and peritoneal cancers. Having family members with these cancers may be associated with an increased risk for harmful changes (mutations) in the breast cancer genes BRCA1 and  BRCA2. Results of the assessment will determine the need for genetic counseling and BRCA1 and BRCA2 testing.  Your health care provider may recommend that you be screened regularly for cancer of the pelvic organs (ovaries, uterus, and vagina). This screening involves a pelvic examination, including checking for microscopic changes to the surface of your cervix (Pap test). You may be encouraged to have this screening done every 3 years, beginning at age 21.  For women ages 30-65, health care providers may recommend pelvic exams and Pap testing every 3 years, or they may recommend the Pap and pelvic exam, combined with testing for human papilloma virus (HPV), every 5 years. Some types of HPV increase your risk of cervical cancer. Testing for HPV may also be done on women of any age with unclear Pap test results.  Other health care providers may not recommend any screening for nonpregnant women who are considered low risk for pelvic cancer and who do not have symptoms. Ask your health care provider if a screening pelvic exam is right for   you.  If you have had past treatment for cervical cancer or a condition that could lead to cancer, you need Pap tests and screening for cancer for at least 20 years after your treatment. If Pap tests have been discontinued, your risk factors (such as having a new sexual partner) need to be reassessed to determine if screening should resume. Some women have medical problems that increase the chance of getting cervical cancer. In these cases, your health care provider may recommend more frequent screening and Pap tests.  Colorectal cancer can be detected and often prevented. Most routine colorectal cancer screening begins at the age of 50 years and continues through age 75 years. However, your health care provider may recommend screening at an earlier age if you have risk factors for colon cancer. On a yearly basis, your health care provider may provide home test kits to check  for hidden blood in the stool. Use of a small camera at the end of a tube, to directly examine the colon (sigmoidoscopy or colonoscopy), can detect the earliest forms of colorectal cancer. Talk to your health care provider about this at age 50, when routine screening begins. Direct exam of the colon should be repeated every 5-10 years through age 75 years, unless early forms of precancerous polyps or small growths are found.  People who are at an increased risk for hepatitis B should be screened for this virus. You are considered at high risk for hepatitis B if:  You were born in a country where hepatitis B occurs often. Talk with your health care provider about which countries are considered high risk.  Your parents were born in a high-risk country and you have not received a shot to protect against hepatitis B (hepatitis B vaccine).  You have HIV or AIDS.  You use needles to inject street drugs.  You live with, or have sex with, someone who has hepatitis B.  You get hemodialysis treatment.  You take certain medicines for conditions like cancer, organ transplantation, and autoimmune conditions.  Hepatitis C blood testing is recommended for all people born from 1945 through 1965 and any individual with known risks for hepatitis C.  Practice safe sex. Use condoms and avoid high-risk sexual practices to reduce the spread of sexually transmitted infections (STIs). STIs include gonorrhea, chlamydia, syphilis, trichomonas, herpes, HPV, and human immunodeficiency virus (HIV). Herpes, HIV, and HPV are viral illnesses that have no cure. They can result in disability, cancer, and death.  You should be screened for sexually transmitted illnesses (STIs) including gonorrhea and chlamydia if:  You are sexually active and are younger than 24 years.  You are older than 24 years and your health care provider tells you that you are at risk for this type of infection.  Your sexual activity has changed  since you were last screened and you are at an increased risk for chlamydia or gonorrhea. Ask your health care provider if you are at risk.  If you are at risk of being infected with HIV, it is recommended that you take a prescription medicine daily to prevent HIV infection. This is called preexposure prophylaxis (PrEP). You are considered at risk if:  You are sexually active and do not regularly use condoms or know the HIV status of your partner(s).  You take drugs by injection.  You are sexually active with a partner who has HIV.  Talk with your health care provider about whether you are at high risk of being infected with HIV. If   you choose to begin PrEP, you should first be tested for HIV. You should then be tested every 3 months for as long as you are taking PrEP.  Osteoporosis is a disease in which the bones lose minerals and strength with aging. This can result in serious bone fractures or breaks. The risk of osteoporosis can be identified using a bone density scan. Women ages 67 years and over and women at risk for fractures or osteoporosis should discuss screening with their health care providers. Ask your health care provider whether you should take a calcium supplement or vitamin D to reduce the rate of osteoporosis.  Menopause can be associated with physical symptoms and risks. Hormone replacement therapy is available to decrease symptoms and risks. You should talk to your health care provider about whether hormone replacement therapy is right for you.  Use sunscreen. Apply sunscreen liberally and repeatedly throughout the day. You should seek shade when your shadow is shorter than you. Protect yourself by wearing long sleeves, pants, a wide-brimmed hat, and sunglasses year round, whenever you are outdoors.  Once a month, do a whole body skin exam, using a mirror to look at the skin on your back. Tell your health care provider of new moles, moles that have irregular borders, moles that  are larger than a pencil eraser, or moles that have changed in shape or color.  Stay current with required vaccines (immunizations).  Influenza vaccine. All adults should be immunized every year.  Tetanus, diphtheria, and acellular pertussis (Td, Tdap) vaccine. Pregnant women should receive 1 dose of Tdap vaccine during each pregnancy. The dose should be obtained regardless of the length of time since the last dose. Immunization is preferred during the 27th-36th week of gestation. An adult who has not previously received Tdap or who does not know her vaccine status should receive 1 dose of Tdap. This initial dose should be followed by tetanus and diphtheria toxoids (Td) booster doses every 10 years. Adults with an unknown or incomplete history of completing a 3-dose immunization series with Td-containing vaccines should begin or complete a primary immunization series including a Tdap dose. Adults should receive a Td booster every 10 years.  Varicella vaccine. An adult without evidence of immunity to varicella should receive 2 doses or a second dose if she has previously received 1 dose. Pregnant females who do not have evidence of immunity should receive the first dose after pregnancy. This first dose should be obtained before leaving the health care facility. The second dose should be obtained 4-8 weeks after the first dose.  Human papillomavirus (HPV) vaccine. Females aged 13-26 years who have not received the vaccine previously should obtain the 3-dose series. The vaccine is not recommended for use in pregnant females. However, pregnancy testing is not needed before receiving a dose. If a female is found to be pregnant after receiving a dose, no treatment is needed. In that case, the remaining doses should be delayed until after the pregnancy. Immunization is recommended for any person with an immunocompromised condition through the age of 61 years if she did not get any or all doses earlier. During the  3-dose series, the second dose should be obtained 4-8 weeks after the first dose. The third dose should be obtained 24 weeks after the first dose and 16 weeks after the second dose.  Zoster vaccine. One dose is recommended for adults aged 30 years or older unless certain conditions are present.  Measles, mumps, and rubella (MMR) vaccine. Adults born  before 1957 generally are considered immune to measles and mumps. Adults born in 1957 or later should have 1 or more doses of MMR vaccine unless there is a contraindication to the vaccine or there is laboratory evidence of immunity to each of the three diseases. A routine second dose of MMR vaccine should be obtained at least 28 days after the first dose for students attending postsecondary schools, health care workers, or international travelers. People who received inactivated measles vaccine or an unknown type of measles vaccine during 1963-1967 should receive 2 doses of MMR vaccine. People who received inactivated mumps vaccine or an unknown type of mumps vaccine before 1979 and are at high risk for mumps infection should consider immunization with 2 doses of MMR vaccine. For females of childbearing age, rubella immunity should be determined. If there is no evidence of immunity, females who are not pregnant should be vaccinated. If there is no evidence of immunity, females who are pregnant should delay immunization until after pregnancy. Unvaccinated health care workers born before 1957 who lack laboratory evidence of measles, mumps, or rubella immunity or laboratory confirmation of disease should consider measles and mumps immunization with 2 doses of MMR vaccine or rubella immunization with 1 dose of MMR vaccine.  Pneumococcal 13-valent conjugate (PCV13) vaccine. When indicated, a person who is uncertain of his immunization history and has no record of immunization should receive the PCV13 vaccine. All adults 65 years of age and older should receive this  vaccine. An adult aged 19 years or older who has certain medical conditions and has not been previously immunized should receive 1 dose of PCV13 vaccine. This PCV13 should be followed with a dose of pneumococcal polysaccharide (PPSV23) vaccine. Adults who are at high risk for pneumococcal disease should obtain the PPSV23 vaccine at least 8 weeks after the dose of PCV13 vaccine. Adults older than 46 years of age who have normal immune system function should obtain the PPSV23 vaccine dose at least 1 year after the dose of PCV13 vaccine.  Pneumococcal polysaccharide (PPSV23) vaccine. When PCV13 is also indicated, PCV13 should be obtained first. All adults aged 65 years and older should be immunized. An adult younger than age 65 years who has certain medical conditions should be immunized. Any person who resides in a nursing home or long-term care facility should be immunized. An adult smoker should be immunized. People with an immunocompromised condition and certain other conditions should receive both PCV13 and PPSV23 vaccines. People with human immunodeficiency virus (HIV) infection should be immunized as soon as possible after diagnosis. Immunization during chemotherapy or radiation therapy should be avoided. Routine use of PPSV23 vaccine is not recommended for American Indians, [place] Natives, or people younger than 65 years unless there are medical conditions that require PPSV23 vaccine. When indicated, people who have unknown immunization and have no record of immunization should receive PPSV23 vaccine. One-time revaccination 5 years after the first dose of PPSV23 is recommended for people aged 19-64 years who have chronic kidney failure, nephrotic syndrome, asplenia, or immunocompromised conditions. People who received 1-2 doses of PPSV23 before age 65 years should receive another dose of PPSV23 vaccine at age 65 years or later if at least 5 years have passed since the previous dose. Doses of PPSV23 are not  needed for people immunized with PPSV23 at or after age 65 years.  Meningococcal vaccine. Adults with asplenia or persistent complement component deficiencies should receive 2 doses of quadrivalent meningococcal conjugate (MenACWY-D) vaccine. The doses should be obtained   at least 2 months apart. Microbiologists working with certain meningococcal bacteria, [place] recruits, people at risk during an outbreak, and people who travel to or live in countries with a high rate of meningitis should be immunized. A first-year college student up through age 34 years who is living in a residence hall should receive a dose if she did not receive a dose on or after her 16th birthday. Adults who have certain high-risk conditions should receive one or more doses of vaccine.  Hepatitis A vaccine. Adults who wish to be protected from this disease, have certain high-risk conditions, work with hepatitis A-infected animals, work in hepatitis A research labs, or travel to or work in countries with a high rate of hepatitis A should be immunized. Adults who were previously unvaccinated and who anticipate close contact with an international adoptee during the first 60 days after arrival in the [place] from a country with a high rate of hepatitis A should be immunized.  Hepatitis B vaccine. Adults who wish to be protected from this disease, have certain high-risk conditions, may be exposed to blood or other infectious body fluids, are household contacts or sex partners of hepatitis B positive people, are clients or workers in certain care facilities, or travel to or work in countries with a high rate of hepatitis B should be immunized.  Haemophilus influenzae type b (Hib) vaccine. A previously unvaccinated person with asplenia or sickle cell disease or having a scheduled splenectomy should receive 1 dose of Hib vaccine. Regardless of previous immunization, a recipient of a hematopoietic stem cell transplant should receive a  3-dose series 6-12 months after her successful transplant. Hib vaccine is not recommended for adults with HIV infection. Preventive Services / Frequency Ages 35 to 4 years  Blood pressure check.** / Every 3-5 years.  Lipid and cholesterol check.** / Every 5 years beginning at age 60.  Clinical breast exam.** / Every 3 years for women in their 71s and 10s.  BRCA-related cancer risk assessment.** / For women who have family members with a BRCA-related cancer (breast, ovarian, tubal, or peritoneal cancers).  Pap test.** / Every 2 years from ages 76 through 26. Every 3 years starting at age 61 through age 76 or 93 with a history of 3 consecutive normal Pap tests.  HPV screening.** / Every 3 years from ages 37 through ages 60 to 51 with a history of 3 consecutive normal Pap tests.  Hepatitis C blood test.** / For any individual with known risks for hepatitis C.  Skin self-exam. / Monthly.  Influenza vaccine. / Every year.  Tetanus, diphtheria, and acellular pertussis (Tdap, Td) vaccine.** / Consult your health care provider. Pregnant women should receive 1 dose of Tdap vaccine during each pregnancy. 1 dose of Td every 10 years.  Varicella vaccine.** / Consult your health care provider. Pregnant females who do not have evidence of immunity should receive the first dose after pregnancy.  HPV vaccine. / 3 doses over 6 months, if 93 and younger. The vaccine is not recommended for use in pregnant females. However, pregnancy testing is not needed before receiving a dose.  Measles, mumps, rubella (MMR) vaccine.** / You need at least 1 dose of MMR if you were born in 1957 or later. You may also need a 2nd dose. For females of childbearing age, rubella immunity should be determined. If there is no evidence of immunity, females who are not pregnant should be vaccinated. If there is no evidence of immunity, females who are  pregnant should delay immunization until after pregnancy.  Pneumococcal  13-valent conjugate (PCV13) vaccine.** / Consult your health care provider.  Pneumococcal polysaccharide (PPSV23) vaccine.** / 1 to 2 doses if you smoke cigarettes or if you have certain conditions.  Meningococcal vaccine.** / 1 dose if you are age 68 to 8 years and a Market researcher living in a residence hall, or have one of several medical conditions, you need to get vaccinated against meningococcal disease. You may also need additional booster doses.  Hepatitis A vaccine.** / Consult your health care provider.  Hepatitis B vaccine.** / Consult your health care provider.  Haemophilus influenzae type b (Hib) vaccine.** / Consult your health care provider. Ages 7 to 53 years  Blood pressure check.** / Every year.  Lipid and cholesterol check.** / Every 5 years beginning at age 25 years.  Lung cancer screening. / Every year if you are aged 11-80 years and have a 30-pack-year history of smoking and currently smoke or have quit within the past 15 years. Yearly screening is stopped once you have quit smoking for at least 15 years or develop a health problem that would prevent you from having lung cancer treatment.  Clinical breast exam.** / Every year after age 48 years.  BRCA-related cancer risk assessment.** / For women who have family members with a BRCA-related cancer (breast, ovarian, tubal, or peritoneal cancers).  Mammogram.** / Every year beginning at age 41 years and continuing for as long as you are in good health. Consult with your health care provider.  Pap test.** / Every 3 years starting at age 65 years through age 37 or 70 years with a history of 3 consecutive normal Pap tests.  HPV screening.** / Every 3 years from ages 72 years through ages 60 to 40 years with a history of 3 consecutive normal Pap tests.  Fecal occult blood test (FOBT) of stool. / Every year beginning at age 21 years and continuing until age 5 years. You may not need to do this test if you get  a colonoscopy every 10 years.  Flexible sigmoidoscopy or colonoscopy.** / Every 5 years for a flexible sigmoidoscopy or every 10 years for a colonoscopy beginning at age 35 years and continuing until age 48 years.  Hepatitis C blood test.** / For all people born from [birth year] through 1965 and any individual with known risks for hepatitis C.  Skin self-exam. / Monthly.  Influenza vaccine. / Every year.  Tetanus, diphtheria, and acellular pertussis (Tdap/Td) vaccine.** / Consult your health care provider. Pregnant women should receive 1 dose of Tdap vaccine during each pregnancy. 1 dose of Td every 10 years.  Varicella vaccine.** / Consult your health care provider. Pregnant females who do not have evidence of immunity should receive the first dose after pregnancy.  Zoster vaccine.** / 1 dose for adults aged 30 years or older.  Measles, mumps, rubella (MMR) vaccine.** / You need at least 1 dose of MMR if you were born in 1957 or later. You may also need a second dose. For females of childbearing age, rubella immunity should be determined. If there is no evidence of immunity, females who are not pregnant should be vaccinated. If there is no evidence of immunity, females who are pregnant should delay immunization until after pregnancy.  Pneumococcal 13-valent conjugate (PCV13) vaccine.** / Consult your health care provider.  Pneumococcal polysaccharide (PPSV23) vaccine.** / 1 to 2 doses if you smoke cigarettes or if you have certain conditions.  Meningococcal vaccine.** /  Consult your health care provider.  Hepatitis A vaccine.** / Consult your health care provider.  Hepatitis B vaccine.** / Consult your health care provider.  Haemophilus influenzae type b (Hib) vaccine.** / Consult your health care provider. Ages 74 years and over  Blood pressure check.** / Every year.  Lipid and cholesterol check.** / Every 5 years beginning at age 9 years.  Lung cancer screening. / Every year if you  are aged 45-80 years and have a 30-pack-year history of smoking and currently smoke or have quit within the past 15 years. Yearly screening is stopped once you have quit smoking for at least 15 years or develop a health problem that would prevent you from having lung cancer treatment.  Clinical breast exam.** / Every year after age 41 years.  BRCA-related cancer risk assessment.** / For women who have family members with a BRCA-related cancer (breast, ovarian, tubal, or peritoneal cancers).  Mammogram.** / Every year beginning at age 9 years and continuing for as long as you are in good health. Consult with your health care provider.  Pap test.** / Every 3 years starting at age 71 years through age 70 or 69 years with 3 consecutive normal Pap tests. Testing can be stopped between 65 and 70 years with 3 consecutive normal Pap tests and no abnormal Pap or HPV tests in the past 10 years.  HPV screening.** / Every 3 years from ages 13 years through ages 31 or 64 years with a history of 3 consecutive normal Pap tests. Testing can be stopped between 65 and 70 years with 3 consecutive normal Pap tests and no abnormal Pap or HPV tests in the past 10 years.  Fecal occult blood test (FOBT) of stool. / Every year beginning at age 24 years and continuing until age 3 years. You may not need to do this test if you get a colonoscopy every 10 years.  Flexible sigmoidoscopy or colonoscopy.** / Every 5 years for a flexible sigmoidoscopy or every 10 years for a colonoscopy beginning at age 58 years and continuing until age 20 years.  Hepatitis C blood test.** / For all people born from [birth year] through 1966 and any individual with known risks for hepatitis C.  Osteoporosis screening.** / A one-time screening for women ages 39 years and over and women at risk for fractures or osteoporosis.  Skin self-exam. / Monthly.  Influenza vaccine. / Every year.  Tetanus, diphtheria, and acellular pertussis (Tdap/Td)  vaccine.** / 1 dose of Td every 10 years.  Varicella vaccine.** / Consult your health care provider.  Zoster vaccine.** / 1 dose for adults aged 70 years or older.  Pneumococcal 13-valent conjugate (PCV13) vaccine.** / Consult your health care provider.  Pneumococcal polysaccharide (PPSV23) vaccine.** / 1 dose for all adults aged 23 years and older.  Meningococcal vaccine.** / Consult your health care provider.  Hepatitis A vaccine.** / Consult your health care provider.  Hepatitis B vaccine.** / Consult your health care provider.  Haemophilus influenzae type b (Hib) vaccine.** / Consult your health care provider. ** Family history and personal history of risk and conditions may change your health care provider's recommendations.   This information is not intended to replace advice given to you by your health care provider. Make sure you discuss any questions you have with your health care provider.   Document Released: 02/02/2002 Document Revised: 12/28/2014 Document Reviewed: 05/04/2011 Elsevier Interactive Patient Education [employer]. [id number]

## 2016-05-29 NOTE — Progress Notes (Signed)
Pre visit review using our clinic review tool, if applicable. No additional management support is needed unless otherwise documented below in the visit note. 

## 2016-05-29 NOTE — Progress Notes (Signed)
Subjective:     Ashlee Swanson is a 45 y.o. female and is here for a comprehensive physical exam. The patient reports no problems.  Social History   Social History  . Marital Status: Married    Spouse Name: N/A  . Number of Children: N/A  . Years of Education: N/A   Occupational History  . Not on file.   Social History Main Topics  . Smoking status: Former Research scientist (life sciences)  . Smokeless tobacco: Not on file  . Alcohol Use: Not on file  . Drug Use: Not on file  . Sexual Activity: Not on file   Other Topics Concern  . Not on file   Social History Narrative   Health Maintenance  Topic Date Due  . HIV Screening  10/29/1985  . MAMMOGRAM  04/18/2015  . INFLUENZA VACCINE  07/21/2016  . PAP SMEAR  04/17/2017  . TETANUS/TDAP  12/31/2018    The following portions of the patient's history were reviewed and updated as appropriate:  She  has a past medical history of Arthritis. She  does not have any pertinent problems on file. She  has past surgical history that includes Bunionectomy (Left, 1999). Her family history includes Breast cancer in her maternal aunt; Colon cancer in her paternal aunt; Hypertension in her brother, father, and mother; Stroke in her maternal grandmother and paternal grandfather. She  reports that she has quit smoking. She does not have any smokeless tobacco history on file. Her alcohol and drug histories are not on file. She has a current medication list which includes the following prescription(s): diclofenac and eszopiclone. Current Outpatient Prescriptions on File Prior to Visit  Medication Sig Dispense Refill  . Eszopiclone 3 MG TABS Take 1 tablet (3 mg total) by mouth at bedtime as needed. 30 tablet 2   No current facility-administered medications on file prior to visit.   She is allergic to minocycline; codeine; and tetracycline..  Review of Systems Review of Systems  Constitutional: Negative for activity change, appetite change and fatigue.   HENT: Negative for hearing loss, congestion, tinnitus and ear discharge.  dentist q39m Eyes: Negative for visual disturbance (see optho q1y -- vision corrected to 20/20 with glasses).  Respiratory: Negative for cough, chest tightness and shortness of breath.   Cardiovascular: Negative for chest pain, palpitations and leg swelling.  Gastrointestinal: Negative for abdominal pain, diarrhea, constipation and abdominal distention.  Genitourinary: Negative for urgency, frequency, decreased urine volume and difficulty urinating.  Musculoskeletal: Negative for back pain, arthralgias and gait problem.  Skin: Negative for color change, pallor and rash.  Neurological: Negative for dizziness, light-headedness, numbness and headaches.  Hematological: Negative for adenopathy. Does not bruise/bleed easily.  Psychiatric/Behavioral: Negative for suicidal ideas, confusion, sleep disturbance, self-injury, dysphoric mood, decreased concentration and agitation.       Objective:    BP 120/70 mmHg  Pulse 65  Temp(Src) 98.2 F (36.8 C) (Oral)  Wt 183 lb 3.2 oz (83.099 kg)  SpO2 98% General appearance: alert, cooperative, appears stated age and no distress Head: Normocephalic, without obvious abnormality, atraumatic Eyes: conjunctivae/corneas clear. PERRL, EOM's intact. Fundi benign. Ears: normal TM's and external ear canals both ears Nose: Nares normal. Septum midline. Mucosa normal. No drainage or sinus tenderness. Throat: lips, mucosa, and tongue normal; teeth and gums normal Neck: no adenopathy, no carotid bruit, no JVD, supple, symmetrical, trachea midline and thyroid not enlarged, symmetric, no tenderness/mass/nodules Back: symmetric, no curvature. ROM normal. No CVA tenderness. Lungs: clear to auscultation bilaterally Breasts: normal appearance,  no masses or tenderness Heart: regular rate and rhythm, S1, S2 normal, no murmur, click, rub or gallop Abdomen: soft, non-tender; bowel sounds normal; no  masses,  no organomegaly Pelvic: cervix normal in appearance, external genitalia normal, no adnexal masses or tenderness, no bladder tenderness, no cervical motion tenderness, uterus normal size, shape, and consistency and vagina normal without discharge Extremities: extremities normal, atraumatic, no cyanosis or edema Pulses: 2+ and symmetric Skin: Skin color, texture, turgor normal. No rashes or lesions Lymph nodes: Cervical, supraclavicular, and axillary nodes normal. Neurologic: Alert and oriented X 3, normal strength and tone. Normal symmetric reflexes. Normal coordination and gait    Assessment:    Healthy female exam.      Plan:  1. Arthritis of spine   - Diclofenac (ZORVOLEX) 35 MG CAPS; 1 po bid  Dispense: 60 capsule; Refill: 3  2. Preventative health care See above - Comprehensive metabolic panel - CBC with Differential/Platelet - Lipid panel - POCT urinalysis dipstick - TSH - Cytology - PAP  3. Insomnia   - Eszopiclone 3 MG TABS; Take 1 tablet (3 mg total) by mouth at bedtime as needed.  Dispense: 30 tablet; Refill: 2  4. Cervical cancer screening   - Cytology - PAP  5. Hematuria   - Urine culture; Future - Urine culture     .See After Visit Summary for Counseling Recommendations

## 2016-05-30 LAB — URINE CULTURE
Colony Count: NO GROWTH
ORGANISM ID, BACTERIA: NO GROWTH

## 2016-06-01 LAB — CYTOLOGY - PAP

## 2017-06-03 ENCOUNTER — Encounter: Payer: Self-pay | Admitting: Family Medicine

## 2017-06-03 ENCOUNTER — Ambulatory Visit (INDEPENDENT_AMBULATORY_CARE_PROVIDER_SITE_OTHER): Payer: 59 | Admitting: Family Medicine

## 2017-06-03 VITALS — BP 128/78 | HR 72 | Temp 98.4°F | Resp 16 | Ht 64.5 in | Wt 193.2 lb

## 2017-06-03 DIAGNOSIS — Z0000 Encounter for general adult medical examination without abnormal findings: Secondary | ICD-10-CM | POA: Diagnosis not present

## 2017-06-03 LAB — COMPREHENSIVE METABOLIC PANEL
ALBUMIN: 4.1 g/dL (ref 3.5–5.2)
ALT: 11 U/L (ref 0–35)
AST: 15 U/L (ref 0–37)
Alkaline Phosphatase: 45 U/L (ref 39–117)
BILIRUBIN TOTAL: 0.4 mg/dL (ref 0.2–1.2)
BUN: 12 mg/dL (ref 6–23)
CHLORIDE: 107 meq/L (ref 96–112)
CO2: 26 mEq/L (ref 19–32)
CREATININE: 0.88 mg/dL (ref 0.40–1.20)
Calcium: 9.3 mg/dL (ref 8.4–10.5)
GFR: 73.33 mL/min (ref >60.00)
GLUCOSE: 93 mg/dL (ref 70–99)
POTASSIUM: 3.9 meq/L (ref 3.5–5.1)
SODIUM: 138 meq/L (ref 135–145)
TOTAL PROTEIN: 7 g/dL (ref 6.0–8.3)

## 2017-06-03 LAB — LIPID PANEL
Cholesterol: 151 mg/dL (ref 0–200)
HDL: 46.2 mg/dL (ref >39.00)
LDL Cholesterol: 89 mg/dL (ref 0–99)
NONHDL: 104.86
Total CHOL/HDL Ratio: 3
Triglycerides: 78 mg/dL (ref 0.0–149.0)
VLDL: 15.6 mg/dL (ref 0.0–40.0)

## 2017-06-03 LAB — POC URINALSYSI DIPSTICK (AUTOMATED)
Bilirubin, UA: NEGATIVE
Blood, UA: NEGATIVE
GLUCOSE UA: NEGATIVE
KETONES UA: NEGATIVE
LEUKOCYTES UA: NEGATIVE
Nitrite, UA: NEGATIVE
Protein, UA: NEGATIVE
SPEC GRAV UA: 1.025 (ref 1.010–1.025)
Urobilinogen, UA: NEGATIVE E.U./dL — AB
pH, UA: 6 (ref 5.0–8.0)

## 2017-06-03 LAB — CBC WITH DIFFERENTIAL/PLATELET
BASOS PCT: 0.8 % (ref 0.0–3.0)
Basophils Absolute: 0.1 10*3/uL (ref 0.0–0.1)
EOS PCT: 0.8 % (ref 0.0–5.0)
Eosinophils Absolute: 0.1 10*3/uL (ref 0.0–0.7)
HCT: 41.1 % (ref 36.0–46.0)
HEMOGLOBIN: 14 g/dL (ref 12.0–15.0)
LYMPHS ABS: 2.8 10*3/uL (ref 0.7–4.0)
Lymphocytes Relative: 36.8 % (ref 12.0–46.0)
MCHC: 34.1 g/dL (ref 30.0–36.0)
MCV: 99.2 fl (ref 78.0–100.0)
MONO ABS: 0.6 10*3/uL (ref 0.1–1.0)
MONOS PCT: 7.6 % (ref 3.0–12.0)
NEUTROS PCT: 54 % (ref 43.0–77.0)
Neutro Abs: 4.1 10*3/uL (ref 1.4–7.7)
Platelets: 338 10*3/uL (ref 150.0–400.0)
RBC: 4.14 Mil/uL (ref 3.87–5.11)
RDW: 11.9 % (ref 11.5–15.5)
WBC: 7.6 10*3/uL (ref 4.0–10.5)

## 2017-06-03 LAB — T4, FREE: FREE T4: 0.62 ng/dL (ref 0.60–1.60)

## 2017-06-03 LAB — TSH: TSH: 1.68 u[IU]/mL (ref 0.35–4.50)

## 2017-06-03 LAB — HIV ANTIBODY (ROUTINE TESTING W REFLEX): HIV 1&2 Ab, 4th Generation: NONREACTIVE

## 2017-06-03 LAB — T3, FREE: T3, Free: 3.4 pg/mL (ref 2.3–4.2)

## 2017-06-03 NOTE — Progress Notes (Signed)
Subjective:   I acted as a Education administrator for Dr. Carollee Herter.  Guerry Bruin, CMA   Ashlee Swanson is a 46 y.o. female and is here for a comprehensive physical exam. The patient reports no problems.  Social History   Social History  . Marital status: Married    Spouse name: N/A  . Number of children: N/A  . Years of education: N/A   Occupational History  .      [employer]   Social History Main Topics  . Smoking status: Former Smoker    Packs/day: 0.25    Years: 10.00    Quit date: 05/29/2004  . Smokeless tobacco: Never Used  . Alcohol use 0.0 oz/week     Comment: socially  . Drug use: No  . Sexual activity: Not on file   Other Topics Concern  . Not on file   Social History Narrative   Exercise- 4 days a week   Health Maintenance  Topic Date Due  . HIV Screening  10/29/1985  . MAMMOGRAM  04/16/2017  . INFLUENZA VACCINE  07/21/2017  . TETANUS/TDAP  12/31/2018  . PAP SMEAR  05/30/2019    The following portions of the patient's history were reviewed and updated as appropriate:  She  has a past medical history of Arthritis. She  does not have any pertinent problems on file. She  has a past surgical history that includes Bunionectomy (Left, 1999). Her family history includes Breast cancer in her maternal aunt; Colon cancer in her paternal aunt; Hypertension in her brother, father, and mother; Stroke in her maternal grandmother and paternal grandfather. She  reports that she quit smoking about 13 years ago. She has a 2.50 pack-year smoking history. She has never used smokeless tobacco. She reports that she drinks alcohol. She reports that she does not use drugs. She has a current medication list which includes the following prescription(s): eszopiclone. Current Outpatient Prescriptions on File Prior to Visit  Medication Sig Dispense Refill  . Eszopiclone 3 MG TABS Take 1 tablet (3 mg total) by mouth at bedtime as needed. 30 tablet 2   No current facility-administered  medications on file prior to visit.    She is allergic to minocycline; codeine; and tetracycline..  Review of Systems Review of Systems  Constitutional: Negative for activity change, appetite change and fatigue.  HENT: Negative for hearing loss, congestion, tinnitus and ear discharge.  dentist q60m Eyes: Negative for visual disturbance (see optho q1y -- vision corrected to 20/20 with glasses).  Respiratory: Negative for cough, chest tightness and shortness of breath.   Cardiovascular: Negative for chest pain, palpitations and leg swelling.  Gastrointestinal: Negative for abdominal pain, diarrhea, constipation and abdominal distention.  Genitourinary: Negative for urgency, frequency, decreased urine volume and difficulty urinating.  Musculoskeletal: Negative for back pain, arthralgias and gait problem.  Skin: Negative for color change, pallor and rash.  Neurological: Negative for dizziness, light-headedness, numbness and headaches.  Hematological: Negative for adenopathy. Does not bruise/bleed easily.  Psychiatric/Behavioral: Negative for suicidal ideas, confusion, sleep disturbance, self-injury, dysphoric mood, decreased concentration and agitation.       Objective:    BP 128/78 (BP Location: Left Arm, Cuff Size: Normal)   Pulse 72   Temp 98.4 F (36.9 C) (Oral)   Resp 16   Ht 5' 4.5" (1.638 m)   Wt 193 lb 3.2 oz (87.6 kg)   SpO2 98%   BMI 32.65 kg/m  General appearance: alert, cooperative, appears stated age and no distress Head: Normocephalic, without  obvious abnormality, atraumatic Eyes: conjunctivae/corneas clear. PERRL, EOM's intact. Fundi benign. Ears: normal TM's and external ear canals both ears Nose: Nares normal. Septum midline. Mucosa normal. No drainage or sinus tenderness. Throat: lips, mucosa, and tongue normal; teeth and gums normal Neck: no adenopathy, no carotid bruit, no JVD, supple, symmetrical, trachea midline and thyroid not enlarged, symmetric, no  tenderness/mass/nodules Back: symmetric, no curvature. ROM normal. No CVA tenderness. Lungs: clear to auscultation bilaterally Breasts: gyn Heart: regular rate and rhythm, S1, S2 normal, no murmur, click, rub or gallop Abdomen: soft, non-tender; bowel sounds normal; no masses,  no organomegaly Pelvic: deferred--gyn Extremities: extremities normal, atraumatic, no cyanosis or edema Pulses: 2+ and symmetric Skin: Skin color, texture, turgor normal. No rashes or lesions Lymph nodes: Cervical, supraclavicular, and axillary nodes normal. Neurologic: Alert and oriented X 3, normal strength and tone. Normal symmetric reflexes. Normal coordination and gait    Assessment:    Healthy female exam.      Plan:    ghm utd Check labs See After Visit Summary for Counseling Recommendations    1. Preventative health care See above - POCT Urinalysis Dipstick (Automated) - CBC with Differential/Platelet - Comprehensive metabolic panel - Lipid panel - TSH - HIV antibody - T3, free - T4, free

## 2017-06-03 NOTE — Patient Instructions (Signed)
Preventive Care 40-64 Years, Female Preventive care refers to lifestyle choices and visits with your health care provider that can promote health and wellness. What does preventive care include?  A yearly physical exam. This is also called an annual well check.  Dental exams once or twice a year.  Routine eye exams. Ask your health care provider how often you should have your eyes checked.  Personal lifestyle choices, including: ? Daily care of your teeth and gums. ? Regular physical activity. ? Eating a healthy diet. ? Avoiding tobacco and drug use. ? Limiting alcohol use. ? Practicing safe sex. ? Taking low-dose aspirin daily starting at age 58. ? Taking vitamin and mineral supplements as recommended by your health care provider. What happens during an annual well check? The services and screenings done by your health care provider during your annual well check will depend on your age, overall health, lifestyle risk factors, and family history of disease. Counseling Your health care provider may ask you questions about your:  Alcohol use.  Tobacco use.  Drug use.  Emotional well-being.  Home and relationship well-being.  Sexual activity.  Eating habits.  Work and work Statistician.  Method of birth control.  Menstrual cycle.  Pregnancy history.  Screening You may have the following tests or measurements:  Height, weight, and BMI.  Blood pressure.  Lipid and cholesterol levels. These may be checked every 5 years, or more frequently if you are over 81 years old.  Skin check.  Lung cancer screening. You may have this screening every year starting at age 78 if you have a 30-pack-year history of smoking and currently smoke or have quit within the past 15 years.  Fecal occult blood test (FOBT) of the stool. You may have this test every year starting at age 65.  Flexible sigmoidoscopy or colonoscopy. You may have a sigmoidoscopy every 5 years or a colonoscopy  every 10 years starting at age 30.  Hepatitis C blood test.  Hepatitis B blood test.  Sexually transmitted disease (STD) testing.  Diabetes screening. This is done by checking your blood sugar (glucose) after you have not eaten for a while (fasting). You may have this done every 1-3 years.  Mammogram. This may be done every 1-2 years. Talk to your health care provider about when you should start having regular mammograms. This may depend on whether you have a family history of breast cancer.  BRCA-related cancer screening. This may be done if you have a family history of breast, ovarian, tubal, or peritoneal cancers.  Pelvic exam and Pap test. This may be done every 3 years starting at age 80. Starting at age 36, this may be done every 5 years if you have a Pap test in combination with an HPV test.  Bone density scan. This is done to screen for osteoporosis. You may have this scan if you are at high risk for osteoporosis.  Discuss your test results, treatment options, and if necessary, the need for more tests with your health care provider. Vaccines Your health care provider may recommend certain vaccines, such as:  Influenza vaccine. This is recommended every year.  Tetanus, diphtheria, and acellular pertussis (Tdap, Td) vaccine. You may need a Td booster every 10 years.  Varicella vaccine. You may need this if you have not been vaccinated.  Zoster vaccine. You may need this after age 5.  Measles, mumps, and rubella (MMR) vaccine. You may need at least one dose of MMR if you were born in  1958 or later. You may also need a second dose.  Pneumococcal 13-valent conjugate (PCV13) vaccine. You may need this if you have certain conditions and were not previously vaccinated.  Pneumococcal polysaccharide (PPSV23) vaccine. You may need one or two doses if you smoke cigarettes or if you have certain conditions.  Meningococcal vaccine. You may need this if you have certain  conditions.  Hepatitis A vaccine. You may need this if you have certain conditions or if you travel or work in places where you may be exposed to hepatitis A.  Hepatitis B vaccine. You may need this if you have certain conditions or if you travel or work in places where you may be exposed to hepatitis B.  Haemophilus influenzae type b (Hib) vaccine. You may need this if you have certain conditions.  Talk to your health care provider about which screenings and vaccines you need and how often you need them. This information is not intended to replace advice given to you by your health care provider. Make sure you discuss any questions you have with your health care provider. Document Released: 01/03/2016 Document Revised: 08/26/2016 Document Reviewed: 10/08/2015 Elsevier Interactive Patient Education  2017 [employer].

## 2017-08-25 ENCOUNTER — Encounter: Payer: Self-pay | Admitting: Family Medicine

## 2017-08-27 ENCOUNTER — Telehealth: Payer: Self-pay | Admitting: Family Medicine

## 2017-08-27 NOTE — Telephone Encounter (Signed)
Self.    Refill request for Eszopiclone 3 MG TABS     Pharmacy: [facility] [place], [place]

## 2017-08-30 MED ORDER — ESZOPICLONE 3 MG PO TABS: 3.0000 mg | ORAL_TABLET | Freq: Every evening | ORAL | 2 refills | Status: DC | PRN

## 2017-08-30 NOTE — Telephone Encounter (Signed)
Please advise ok to refill Eszopicolne

## 2017-08-31 NOTE — Telephone Encounter (Signed)
Rx was filled. Nothing further is needed

## 2017-09-06 NOTE — Telephone Encounter (Signed)
Patient states pharmacy never received a phone call requesting Eszopiclone 3 MG TABS, please advise  [id number] - [place], [place] - [address] [phone] (Phone) [phone] (Fax)

## 2017-09-07 NOTE — Telephone Encounter (Signed)
Called pharmacy they have rx and will have ready for pt. Called pt and made her aware. She had no additional questions at this time. Nothing further is needed

## 2017-11-08 DIAGNOSIS — Z124 Encounter for screening for malignant neoplasm of cervix: Secondary | ICD-10-CM | POA: Diagnosis not present

## 2017-11-08 DIAGNOSIS — Z1231 Encounter for screening mammogram for malignant neoplasm of breast: Secondary | ICD-10-CM | POA: Diagnosis not present

## 2017-11-08 DIAGNOSIS — Z01419 Encounter for gynecological examination (general) (routine) without abnormal findings: Secondary | ICD-10-CM | POA: Diagnosis not present

## 2017-11-08 LAB — HM PAP SMEAR

## 2017-11-08 LAB — HM MAMMOGRAPHY

## 2017-12-10 ENCOUNTER — Encounter: Payer: Self-pay | Admitting: *Deleted

## 2018-01-03 ENCOUNTER — Other Ambulatory Visit: Payer: Self-pay | Admitting: Family Medicine

## 2018-01-03 NOTE — Telephone Encounter (Signed)
Last filled per database: 09/07/17 Last written: 09/07/17 Last ov: 06/03/17 Next ov: 06/09/18 Contract: No Contract  UDS: No UDS on file

## 2018-05-23 ENCOUNTER — Other Ambulatory Visit: Payer: Self-pay | Admitting: Family Medicine

## 2018-05-23 NOTE — Telephone Encounter (Signed)
Copied from [place] [id number]. Topic: Quick Communication - Rx Refill/Question >> May 23, 2018  8:53 AM Margot Ables wrote: Medication: Eszopiclone 3 MG TABS - pt has 2 pills left - she uses about 2-3 per week - pt has appt scheduled for 06/09/18 Has the patient contacted their pharmacy? Advised to call MD Preferred Pharmacy (with phone number or street name): [id number] - [place], [place] [phone] (Phone) [phone] (Fax)

## 2018-05-25 MED ORDER — ESZOPICLONE 3 MG PO TABS: 3.0000 mg | ORAL_TABLET | Freq: Every evening | ORAL | 0 refills | Status: DC | PRN

## 2018-05-25 NOTE — Telephone Encounter (Signed)
Requesting: Eszopiclone 3  Contract:  UDS: Last OV: 06/03/17 Next OV: 06/09/18 Last Refill: 01/03/18   Please advise

## 2018-06-09 ENCOUNTER — Ambulatory Visit (INDEPENDENT_AMBULATORY_CARE_PROVIDER_SITE_OTHER): Payer: 59 | Admitting: Family Medicine

## 2018-06-09 ENCOUNTER — Encounter: Payer: Self-pay | Admitting: Family Medicine

## 2018-06-09 VITALS — BP 127/57 | HR 66 | Temp 98.8°F | Resp 16 | Ht 64.0 in | Wt 192.0 lb

## 2018-06-09 DIAGNOSIS — Z0000 Encounter for general adult medical examination without abnormal findings: Secondary | ICD-10-CM | POA: Diagnosis not present

## 2018-06-09 DIAGNOSIS — G4700 Insomnia, unspecified: Secondary | ICD-10-CM | POA: Diagnosis not present

## 2018-06-09 DIAGNOSIS — D229 Melanocytic nevi, unspecified: Secondary | ICD-10-CM | POA: Diagnosis not present

## 2018-06-09 DIAGNOSIS — Z79899 Other long term (current) drug therapy: Secondary | ICD-10-CM | POA: Diagnosis not present

## 2018-06-09 LAB — CBC WITH DIFFERENTIAL/PLATELET
Basophils Absolute: 0.1 10*3/uL (ref 0.0–0.1)
Basophils Relative: 0.8 % (ref 0.0–3.0)
Eosinophils Absolute: 0.1 10*3/uL (ref 0.0–0.7)
Eosinophils Relative: 1 % (ref 0.0–5.0)
HCT: 40.7 % (ref 36.0–46.0)
Hemoglobin: 14.2 g/dL (ref 12.0–15.0)
Lymphocytes Relative: 34.4 % (ref 12.0–46.0)
Lymphs Abs: 2.7 10*3/uL (ref 0.7–4.0)
MCHC: 34.9 g/dL (ref 30.0–36.0)
MCV: 99.1 fl (ref 78.0–100.0)
Monocytes Absolute: 0.6 10*3/uL (ref 0.1–1.0)
Monocytes Relative: 7.1 % (ref 3.0–12.0)
Neutro Abs: 4.4 10*3/uL (ref 1.4–7.7)
Neutrophils Relative %: 56.7 % (ref 43.0–77.0)
Platelets: 353 10*3/uL (ref 150.0–400.0)
RBC: 4.11 Mil/uL (ref 3.87–5.11)
RDW: 12.3 % (ref 11.5–15.5)
WBC: 7.9 10*3/uL (ref 4.0–10.5)

## 2018-06-09 LAB — COMPREHENSIVE METABOLIC PANEL WITH GFR
ALT: 12 U/L (ref 0–35)
AST: 12 U/L (ref 0–37)
Albumin: 4.3 g/dL (ref 3.5–5.2)
Alkaline Phosphatase: 51 U/L (ref 39–117)
BUN: 13 mg/dL (ref 6–23)
CO2: 27 meq/L (ref 19–32)
Calcium: 9.5 mg/dL (ref 8.4–10.5)
Chloride: 105 meq/L (ref 96–112)
Creatinine, Ser: 0.88 mg/dL (ref 0.40–1.20)
GFR: 73.01 mL/min
Glucose, Bld: 90 mg/dL (ref 70–99)
Potassium: 4.2 meq/L (ref 3.5–5.1)
Sodium: 139 meq/L (ref 135–145)
Total Bilirubin: 0.5 mg/dL (ref 0.2–1.2)
Total Protein: 7 g/dL (ref 6.0–8.3)

## 2018-06-09 LAB — TSH: TSH: 2.04 u[IU]/mL (ref 0.35–4.50)

## 2018-06-09 LAB — LIPID PANEL
Cholesterol: 202 mg/dL — ABNORMAL HIGH (ref 0–200)
HDL: 51.4 mg/dL
LDL Cholesterol: 131 mg/dL — ABNORMAL HIGH (ref 0–99)
NonHDL: 150.68
Total CHOL/HDL Ratio: 4
Triglycerides: 97 mg/dL (ref 0.0–149.0)
VLDL: 19.4 mg/dL (ref 0.0–40.0)

## 2018-06-09 NOTE — Patient Instructions (Signed)
Preventive Care 40-64 Years, Female Preventive care refers to lifestyle choices and visits with your health care provider that can promote health and wellness. What does preventive care include?  A yearly physical exam. This is also called an annual well check.  Dental exams once or twice a year.  Routine eye exams. Ask your health care provider how often you should have your eyes checked.  Personal lifestyle choices, including: ? Daily care of your teeth and gums. ? Regular physical activity. ? Eating a healthy diet. ? Avoiding tobacco and drug use. ? Limiting alcohol use. ? Practicing safe sex. ? Taking low-dose aspirin daily starting at age 58. ? Taking vitamin and mineral supplements as recommended by your health care provider. What happens during an annual well check? The services and screenings done by your health care provider during your annual well check will depend on your age, overall health, lifestyle risk factors, and family history of disease. Counseling Your health care provider may ask you questions about your:  Alcohol use.  Tobacco use.  Drug use.  Emotional well-being.  Home and relationship well-being.  Sexual activity.  Eating habits.  Work and work Statistician.  Method of birth control.  Menstrual cycle.  Pregnancy history.  Screening You may have the following tests or measurements:  Height, weight, and BMI.  Blood pressure.  Lipid and cholesterol levels. These may be checked every 5 years, or more frequently if you are over 81 years old.  Skin check.  Lung cancer screening. You may have this screening every year starting at age 78 if you have a 30-pack-year history of smoking and currently smoke or have quit within the past 15 years.  Fecal occult blood test (FOBT) of the stool. You may have this test every year starting at age 65.  Flexible sigmoidoscopy or colonoscopy. You may have a sigmoidoscopy every 5 years or a colonoscopy  every 10 years starting at age 30.  Hepatitis C blood test.  Hepatitis B blood test.  Sexually transmitted disease (STD) testing.  Diabetes screening. This is done by checking your blood sugar (glucose) after you have not eaten for a while (fasting). You may have this done every 1-3 years.  Mammogram. This may be done every 1-2 years. Talk to your health care provider about when you should start having regular mammograms. This may depend on whether you have a family history of breast cancer.  BRCA-related cancer screening. This may be done if you have a family history of breast, ovarian, tubal, or peritoneal cancers.  Pelvic exam and Pap test. This may be done every 3 years starting at age 80. Starting at age 36, this may be done every 5 years if you have a Pap test in combination with an HPV test.  Bone density scan. This is done to screen for osteoporosis. You may have this scan if you are at high risk for osteoporosis.  Discuss your test results, treatment options, and if necessary, the need for more tests with your health care provider. Vaccines Your health care provider may recommend certain vaccines, such as:  Influenza vaccine. This is recommended every year.  Tetanus, diphtheria, and acellular pertussis (Tdap, Td) vaccine. You may need a Td booster every 10 years.  Varicella vaccine. You may need this if you have not been vaccinated.  Zoster vaccine. You may need this after age 5.  Measles, mumps, and rubella (MMR) vaccine. You may need at least one dose of MMR if you were born in  1957 or later. You may also need a second dose.  Pneumococcal 13-valent conjugate (PCV13) vaccine. You may need this if you have certain conditions and were not previously vaccinated.  Pneumococcal polysaccharide (PPSV23) vaccine. You may need one or two doses if you smoke cigarettes or if you have certain conditions.  Meningococcal vaccine. You may need this if you have certain  conditions.  Hepatitis A vaccine. You may need this if you have certain conditions or if you travel or work in places where you may be exposed to hepatitis A.  Hepatitis B vaccine. You may need this if you have certain conditions or if you travel or work in places where you may be exposed to hepatitis B.  Haemophilus influenzae type b (Hib) vaccine. You may need this if you have certain conditions.  Talk to your health care provider about which screenings and vaccines you need and how often you need them. This information is not intended to replace advice given to you by your health care provider. Make sure you discuss any questions you have with your health care provider. Document Released: 01/03/2016 Document Revised: 08/26/2016 Document Reviewed: 10/08/2015 Elsevier Interactive Patient Education  2018 Elsevier Inc.  

## 2018-06-09 NOTE — Progress Notes (Signed)
Subjective:     Ashlee Swanson is a 47 y.o. female and is here for a comprehensive physical exam. The patient reports no problems.  Social History   Socioeconomic History  . Marital status: Married    Spouse name: Not on file  . Number of children: Not on file  . Years of education: Not on file  . Highest education level: Not on file  Occupational History    Comment: [employer]  Social Needs  . Financial resource strain: Not on file  . Food insecurity:    Worry: Not on file    Inability: Not on file  . Transportation needs:    Medical: Not on file    Non-medical: Not on file  Tobacco Use  . Smoking status: Former Smoker    Packs/day: 0.25    Years: 10.00    Pack years: 2.50    Last attempt to quit: 05/29/2004    Years since quitting: 14.0  . Smokeless tobacco: Never Used  Substance and Sexual Activity  . Alcohol use: Yes    Alcohol/week: 0.0 oz    Comment: socially  . Drug use: No  . Sexual activity: Not on file  Lifestyle  . Physical activity:    Days per week: Not on file    Minutes per session: Not on file  . Stress: Not on file  Relationships  . Social connections:    Talks on phone: Not on file    Gets together: Not on file    Attends religious service: Not on file    Active member of club or organization: Not on file    Attends meetings of clubs or organizations: Not on file    Relationship status: Not on file  . Intimate partner violence:    Fear of current or ex partner: Not on file    Emotionally abused: Not on file    Physically abused: Not on file    Forced sexual activity: Not on file  Other Topics Concern  . Not on file  Social History Narrative   Exercise- 4 days a week   Health Maintenance  Topic Date Due  . INFLUENZA VACCINE  07/21/2018  . MAMMOGRAM  11/08/2018  . TETANUS/TDAP  12/31/2018  . PAP SMEAR  11/08/2020  . HIV Screening  Completed    The following portions of the patient's history were reviewed and updated as  appropriate:  She  has a past medical history of Arthritis. She does not have any pertinent problems on file. She  has a past surgical history that includes Bunionectomy (Left, 1999). Her family history includes Breast cancer in her maternal aunt; Colon cancer in her paternal aunt; Heart disease in her unknown relative; Hypertension in her brother, father, and mother; Stroke in her maternal grandmother and paternal grandfather. She  reports that she quit smoking about 14 years ago. She has a 2.50 pack-year smoking history. She has never used smokeless tobacco. She reports that she drinks alcohol. She reports that she does not use drugs. She has a current medication list which includes the following prescription(s): eszopiclone. Current Outpatient Medications on File Prior to Visit  Medication Sig Dispense Refill  . Eszopiclone 3 MG TABS Take 1 tablet (3 mg total) by mouth at bedtime as needed. Take immediately before bedtime 30 tablet 0   No current facility-administered medications on file prior to visit.    She is allergic to minocycline; codeine; and tetracycline..  Review of Systems Review of Systems  Constitutional: Negative for activity change, appetite change and fatigue.  HENT: Negative for hearing loss, congestion, tinnitus and ear discharge.  dentist q78m Eyes: Negative for visual disturbance (see optho q1y -- vision corrected to 20/20 with glasses).  Respiratory: Negative for cough, chest tightness and shortness of breath.   Cardiovascular: Negative for chest pain, palpitations and leg swelling.  Gastrointestinal: Negative for abdominal pain, diarrhea, constipation and abdominal distention.  Genitourinary: Negative for urgency, frequency, decreased urine volume and difficulty urinating.  Musculoskeletal: Negative for back pain, arthralgias and gait problem.  Skin: Negative for color change, pallor and rash.  Neurological: Negative for dizziness, light-headedness, numbness and  headaches.  Hematological: Negative for adenopathy. Does not bruise/bleed easily.  Psychiatric/Behavioral: Negative for suicidal ideas, confusion, sleep disturbance, self-injury, dysphoric mood, decreased concentration and agitation.      Objective:    BP (!) 127/57   Pulse 66   Temp 98.8 F (37.1 C) (Oral)   Resp 16   Ht 5\' 4"  (1.626 m)   Wt 192 lb (87.1 kg)   SpO2 97%   BMI 32.96 kg/m  General appearance: alert, cooperative, appears stated age and no distress Head: Normocephalic, without obvious abnormality, atraumatic Eyes: conjunctivae/corneas clear. PERRL, EOM's intact. Fundi benign. Ears: normal TM's and external ear canals both ears Nose: Nares normal. Septum midline. Mucosa normal. No drainage or sinus tenderness. Throat: lips, mucosa, and tongue normal; teeth and gums normal Neck: no adenopathy, no carotid bruit, no JVD, supple, symmetrical, trachea midline and thyroid not enlarged, symmetric, no tenderness/mass/nodules Back: symmetric, no curvature. ROM normal. No CVA tenderness. Lungs: clear to auscultation bilaterally Breasts: gyn Heart: regular rate and rhythm, S1, S2 normal, no murmur, click, rub or gallop Abdomen: soft, non-tender; bowel sounds normal; no masses,  no organomegaly Pelvic: deferred--gyn Extremities: extremities normal, atraumatic, no cyanosis or edema Pulses: 2+ and symmetric Skin: Skin color, texture, turgor normal. No rashes or lesions Lymph nodes: Cervical, supraclavicular, and axillary nodes normal. Neurologic: Alert and oriented X 3, normal strength and tone. Normal symmetric reflexes. Normal coordination and gait    Assessment:    Healthy female exam.      Plan:    ghm utd Check labs See After Visit Summary for Counseling Recommendations    1. Preventative health care See above - CBC with Differential/Platelet - Comprehensive metabolic panel - Lipid panel - TSH  2. Numerous moles   - Ambulatory referral to Dermatology  3.  Morbid obesity ([place])   - Amb Ref to Medical Weight Management  4. Insomnia, unspecified type   - Pain Mgmt, Profile 8 w/Conf, U  5. High risk medication use   - Pain Mgmt, Profile 8 w/Conf, U

## 2018-06-10 LAB — PAIN MGMT, PROFILE 8 W/CONF, U
6 ACETYLMORPHINE: NEGATIVE ng/mL (ref <10)
AMPHETAMINES: NEGATIVE ng/mL (ref <500)
Alcohol Metabolites: NEGATIVE ng/mL (ref <500)
BUPRENORPHINE, URINE: NEGATIVE ng/mL (ref <5)
Benzodiazepines: NEGATIVE ng/mL (ref <100)
Cocaine Metabolite: NEGATIVE ng/mL (ref <150)
Creatinine: 131.7 mg/dL
MDMA: NEGATIVE ng/mL (ref <500)
Marijuana Metabolite: NEGATIVE ng/mL (ref <20)
OPIATES: NEGATIVE ng/mL (ref <100)
OXIDANT: NEGATIVE ug/mL (ref <200)
Oxycodone: NEGATIVE ng/mL (ref <100)
pH: 6.88 (ref 4.5–9.0)

## 2018-08-17 DIAGNOSIS — D229 Melanocytic nevi, unspecified: Secondary | ICD-10-CM | POA: Diagnosis not present

## 2018-08-17 DIAGNOSIS — L821 Other seborrheic keratosis: Secondary | ICD-10-CM | POA: Diagnosis not present

## 2018-08-17 DIAGNOSIS — L918 Other hypertrophic disorders of the skin: Secondary | ICD-10-CM | POA: Diagnosis not present

## 2018-08-24 ENCOUNTER — Encounter (INDEPENDENT_AMBULATORY_CARE_PROVIDER_SITE_OTHER): Payer: Self-pay

## 2018-09-07 ENCOUNTER — Ambulatory Visit (INDEPENDENT_AMBULATORY_CARE_PROVIDER_SITE_OTHER): Payer: Self-pay | Admitting: Family Medicine

## 2018-09-07 ENCOUNTER — Ambulatory Visit (INDEPENDENT_AMBULATORY_CARE_PROVIDER_SITE_OTHER): Payer: 59 | Admitting: Family Medicine

## 2018-09-07 ENCOUNTER — Encounter (INDEPENDENT_AMBULATORY_CARE_PROVIDER_SITE_OTHER): Payer: Self-pay | Admitting: Family Medicine

## 2018-09-07 VITALS — BP 132/82 | HR 71 | Temp 97.9°F | Ht 64.0 in | Wt 194.0 lb

## 2018-09-07 DIAGNOSIS — E669 Obesity, unspecified: Secondary | ICD-10-CM

## 2018-09-07 DIAGNOSIS — Z0289 Encounter for other administrative examinations: Secondary | ICD-10-CM

## 2018-09-07 DIAGNOSIS — Z1331 Encounter for screening for depression: Secondary | ICD-10-CM

## 2018-09-07 DIAGNOSIS — R5383 Other fatigue: Secondary | ICD-10-CM

## 2018-09-07 DIAGNOSIS — E7849 Other hyperlipidemia: Secondary | ICD-10-CM | POA: Diagnosis not present

## 2018-09-07 DIAGNOSIS — Z6833 Body mass index (BMI) 33.0-33.9, adult: Secondary | ICD-10-CM

## 2018-09-07 DIAGNOSIS — Z9189 Other specified personal risk factors, not elsewhere classified: Secondary | ICD-10-CM

## 2018-09-07 DIAGNOSIS — R0602 Shortness of breath: Secondary | ICD-10-CM

## 2018-09-08 LAB — LIPID PANEL WITH LDL/HDL RATIO
CHOLESTEROL TOTAL: 168 mg/dL (ref 100–199)
HDL: 51 mg/dL (ref >39)
LDL Calculated: 101 mg/dL — ABNORMAL HIGH (ref 0–99)
LDl/HDL Ratio: 2 ratio (ref 0.0–3.2)
TRIGLYCERIDES: 81 mg/dL (ref 0–149)
VLDL Cholesterol Cal: 16 mg/dL (ref 5–40)

## 2018-09-08 LAB — VITAMIN B12: Vitamin B-12: 346 pg/mL (ref 232–1245)

## 2018-09-08 LAB — INSULIN, RANDOM: INSULIN: 6.8 u[IU]/mL (ref 2.6–24.9)

## 2018-09-08 LAB — VITAMIN D 25 HYDROXY (VIT D DEFICIENCY, FRACTURES): Vit D, 25-Hydroxy: 38.7 ng/mL (ref 30.0–100.0)

## 2018-09-08 LAB — HEMOGLOBIN A1C
ESTIMATED AVERAGE GLUCOSE: 103 mg/dL
Hgb A1c MFr Bld: 5.2 % (ref 4.8–5.6)

## 2018-09-08 LAB — FOLATE: FOLATE: 15.6 ng/mL (ref >3.0)

## 2018-09-08 NOTE — Progress Notes (Signed)
Office: [phone]  /  Fax: [phone]   Dear Dr. Carollee Herter,   Thank you for referring Ashlee Swanson to our clinic. The following note includes my evaluation and treatment recommendations.  HPI:   Chief Complaint: Phenix has been referred by Ann Held, DO for consultation regarding *HIS* obesity and obesity related comorbidities.    BRICELYN FREESTONE (MR# [id number]) is a 47 y.o. female who presents on 09/08/2018 for obesity evaluation and treatment. Current BMI is Body mass index is 33.3 kg/m.Marland Kitchen Sanah has been struggling with her weight for many years and has been unsuccessful in either losing weight, maintaining weight loss, or reaching her healthy weight goal. Davon has lactose intolerance.     Kai attended our information session and states she is currently in the action stage of change and ready to dedicate time achieving and maintaining a healthier weight. Cayleen is interested in becoming our patient and working on intensive lifestyle modifications including (but not limited to) diet, exercise and weight loss.    Vicky states her family eats meals together she struggles with family and or coworkers weight loss sabotage her desired weight loss is 26 to 31 she started gaining weight at age 47 her heaviest weight ever was 196 lbs. she has significant food cravings issues  she snacks frequently in the evenings she is frequently drinking liquids with calories she frequently makes poor food choices she frequently eats larger portions than normal  she has binge eating behaviors she struggles with emotional eating    Fatigue Keara feels her energy is lower than it should be. This has worsened with weight gain and has not worsened recently. Kalany denies daytime somnolence and admits to waking up still tired. Patient is at risk for obstructive sleep apnea. Patent has a history of symptoms of morning fatigue and morning  headache. Patient generally gets 5 to 8 hours of sleep per night, and states they generally have restless sleep. Snoring is present. Apneic episodes are present. Epworth Sleepiness Score is 0  EKG was ordered today and shows normal sinus rhythm.  Dyspnea on exertion [name] notes increasing shortness of breath with exercising and seems to be worsening over time with weight gain. She notes getting out of breath sooner with activity than she used to. This has not gotten worse recently. EKG was ordered today and shows normal sinus rhythm. Valyn denies orthopnea.  Hyperlipidemia Afsheen has hyperlipidemia and she is not on medications. Her last LDL was at 131 on 06/09/18. Kaylinn is attempting to improve her cholesterol levels with intensive lifestyle modification including a low saturated fat diet, exercise and weight loss. She denies any chest pain, claudication or myalgias.  At risk for cardiovascular disease Manuelita is at a higher than average risk for cardiovascular disease due to obesity and hyperlipidemia. She currently denies any chest pain.  Depression Screen Khristy's Food and Mood (modified PHQ-9) score was  Depression screen PHQ 2/9 09/07/2018  Decreased Interest 1  Down, Depressed, Hopeless 1  PHQ - 2 Score 2  Altered sleeping 3  Tired, decreased energy 2  Change in appetite 1  Feeling bad or failure about yourself  2  Trouble concentrating 2  Moving slowly or fidgety/restless 0  Suicidal thoughts 0  PHQ-9 Score 12  Difficult doing work/chores Very difficult    ALLERGIES: Allergies  Allergen Reactions  . Minocycline Hives  . Codeine Nausea Only    Abdominal pain, shakes  . Tetracycline Hives  MEDICATIONS: Current Outpatient Medications on File Prior to Visit  Medication Sig Dispense Refill  . Eszopiclone 3 MG TABS Take 1 tablet (3 mg total) by mouth at bedtime as needed. Take immediately before bedtime 30 tablet 0  . Ibuprofen-diphenhydrAMINE Cit (ADVIL PM PO) Take by  mouth.    . polyethylene glycol (MIRALAX / GLYCOLAX) packet Take 17 g by mouth daily.     No current facility-administered medications on file prior to visit.     PAST MEDICAL HISTORY: Past Medical History:  Diagnosis Date  . Arthritis    In the Back  . Back pain   . Bunion   . DDD (degenerative disc disease), lumbar   . High cholesterol   . IBS (irritable bowel syndrome)   . Insomnia   . Kidney stone   . Lactose intolerance   . Snoring     PAST SURGICAL HISTORY: Past Surgical History:  Procedure Laterality Date  . BUNIONECTOMY Left 1999   modified mcbride bunionectony    SOCIAL HISTORY: Social History   Tobacco Use  . Smoking status: Former Smoker    Packs/day: 0.25    Years: 10.00    Pack years: 2.50    Last attempt to quit: 05/29/2004    Years since quitting: 14.2  . Smokeless tobacco: Never Used  Substance Use Topics  . Alcohol use: Yes    Alcohol/week: 0.0 standard drinks    Comment: socially  . Drug use: No    FAMILY HISTORY: Family History  Problem Relation Age of Onset  . Colon cancer Paternal Aunt   . Breast cancer Maternal Aunt   . Heart disease Unknown   . Stroke Maternal Grandmother   . Hypertension Mother   . Hypertension Father   . Hypertension Brother   . Stroke Paternal Grandfather     ROS: Review of Systems  Constitutional: Positive for malaise/fatigue.  HENT: Positive for hearing loss.   Eyes:       + Vision Changes + Wear Glasses or Contacts  Respiratory: Positive for shortness of breath (on exertion).   Cardiovascular: Negative for chest pain, orthopnea and claudication.       + Leg Cramping  Gastrointestinal: Positive for constipation and diarrhea.  Genitourinary: Positive for frequency.  Musculoskeletal: Positive for back pain. Negative for myalgias.  Skin:       + Dryness  Psychiatric/Behavioral: The patient has insomnia.     PHYSICAL EXAM: Blood pressure 132/82, pulse 71, temperature 97.9 F (36.6 C), temperature  source Oral, height 5\' 4"  (1.626 m), weight 194 lb (88 kg), SpO2 97 %. Body mass index is 33.3 kg/m. Physical Exam  Constitutional: She is oriented to person, place, and time. She appears well-developed and well-nourished.  HENT:  Head: Normocephalic and atraumatic.  Nose: Nose normal.  Eyes: EOM are normal. No scleral icterus.  Neck: Normal range of motion. Neck supple. No thyromegaly present.  Cardiovascular: Normal rate and regular rhythm.  Pulmonary/Chest: Effort normal. No respiratory distress.  Abdominal: Soft. There is no tenderness.  + Obesity  Musculoskeletal: Normal range of motion.  Range of Motion normal in all 4 extremities  Neurological: She is alert and oriented to person, place, and time. Coordination normal.  Skin: Skin is warm and dry.  Psychiatric: She has a normal mood and affect. Her behavior is normal.    RECENT LABS AND TESTS: BMET    Component Value Date/Time   NA 139 06/09/2018 0903   K 4.2 06/09/2018 0903   CL 105  06/09/2018 0903   CO2 27 06/09/2018 0903   GLUCOSE 90 06/09/2018 0903   BUN 13 06/09/2018 0903   CREATININE 0.88 06/09/2018 0903   CALCIUM 9.5 06/09/2018 0903   GFRNONAA 74 12/31/2008 1110   GFRAA 90 12/31/2008 1110   Lab Results  Component Value Date   HGBA1C 5.2 09/07/2018   Lab Results  Component Value Date   INSULIN 6.8 09/07/2018   CBC    Component Value Date/Time   WBC 7.9 06/09/2018 0903   RBC 4.11 06/09/2018 0903   HGB 14.2 06/09/2018 0903   HCT 40.7 06/09/2018 0903   PLT 353.0 06/09/2018 0903   MCV 99.1 06/09/2018 0903   MCHC 34.9 06/09/2018 0903   RDW 12.3 06/09/2018 0903   LYMPHSABS 2.7 06/09/2018 0903   MONOABS 0.6 06/09/2018 0903   EOSABS 0.1 06/09/2018 0903   BASOSABS 0.1 06/09/2018 0903   Iron/TIBC/Ferritin/ %Sat No results found for: IRON, TIBC, FERRITIN, IRONPCTSAT Lipid Panel     Component Value Date/Time   CHOL 168 09/07/2018 1245   TRIG 81 09/07/2018 1245   HDL 51 09/07/2018 1245   CHOLHDL 4  06/09/2018 0903   VLDL 19.4 06/09/2018 0903   LDLCALC 101 (H) 09/07/2018 1245   Hepatic Function Panel     Component Value Date/Time   PROT 7.0 06/09/2018 0903   ALBUMIN 4.3 06/09/2018 0903   AST 12 06/09/2018 0903   ALT 12 06/09/2018 0903   ALKPHOS 51 06/09/2018 0903   BILITOT 0.5 06/09/2018 0903   BILIDIR 0.1 12/31/2010 0933      Component Value Date/Time   TSH 2.04 06/09/2018 0903   TSH 1.68 06/03/2017 1044   TSH 1.57 05/29/2016 1036    ECG  shows NSR with a rate of 74 BPM INDIRECT CALORIMETER done today shows a VO2 of 237 and a REE of 1653.  Her calculated basal metabolic rate is 4081 thus her basal metabolic rate is better than expected.    ASSESSMENT AND PLAN: Other fatigue - Plan: Hemoglobin A1c, Insulin, random, VITAMIN D 25 Hydroxy (Vit-D Deficiency, Fractures), Vitamin B12, Folate  SOB (shortness of breath) on exertion - Plan: EKG 12-Lead  Other hyperlipidemia - Plan: Hemoglobin A1c, Insulin, random, Lipid Panel With LDL/HDL Ratio  Depression screening  At risk for heart disease  Class 1 obesity with serious comorbidity and body mass index (BMI) of 33.0 to 33.9 in adult, unspecified obesity type  PLAN: Fatigue Raeanne was informed that her fatigue may be related to obesity, depression or many other causes. Labs will be ordered, and in the meanwhile Paxtyn has agreed to work on diet, exercise and weight loss to help with fatigue. Proper sleep hygiene was discussed including the need for 7-8 hours of quality sleep each night. A sleep study was not ordered based on symptoms and Epworth score. We will order EKG and Indirect Calorimetry today.  Dyspnea on exertion Aislin's shortness of breath appears to be obesity related and exercise induced. She has agreed to work on weight loss and gradually increase exercise to treat her exercise induced shortness of breath. If [name] follows our instructions and loses weight without improvement of her shortness of breath, we  will plan to refer to pulmonology.  We will order Indirect Calorimetry, EKG and labs today. We will monitor this condition regularly. Gunhild agrees to this plan.  Hyperlipidemia Kelee was informed of the American Heart Association Guidelines emphasizing intensive lifestyle modifications as the first line treatment for hyperlipidemia. We discussed many lifestyle modifications today in  depth, and Kayse will continue to work on decreasing saturated fats such as fatty red meat, butter and many fried foods. She will also increase vegetables and lean protein in her diet and continue to work on exercise and weight loss efforts. We will check fasting lipid panel today and Bryton will follow up as directed.  Cardiovascular risk counseling Jovana was given extended (15 minutes) coronary artery disease prevention counseling today. She is 47 y.o. female and has risk factors for heart disease including obesity and hyperlipidemia. We discussed intensive lifestyle modifications today with an emphasis on specific weight loss instructions and strategies. Pt was also informed of the importance of increasing exercise and decreasing saturated fats to help prevent heart disease.  Depression Screen Janalee had a moderately positive depression screening. Depression is commonly associated with obesity and often results in emotional eating behaviors. We will monitor this closely and work on CBT to help improve the non-hunger eating patterns. Referral to Psychology may be required if no improvement is seen as she continues in our clinic.  Obesity Amyria is currently in the action stage of change and her goal is to continue with weight loss efforts. I recommend Philena begin the structured treatment plan as follows:  She has agreed to follow the Category 2 plan +100 calories Kyah has been instructed to eventually work up to a goal of 150 minutes of combined cardio and strengthening exercise per week for weight loss and  overall health benefits. We discussed the following Behavioral Modification Strategies today: planning for success, increasing lean protein intake, increasing vegetables and work on meal planning and easy cooking plans   She was informed of the importance of frequent follow up visits to maximize her success with intensive lifestyle modifications for her multiple health conditions. She was informed we would discuss her lab results at her next visit unless there is a critical issue that needs to be addressed sooner. Emelina agreed to keep her next visit at the agreed upon time to discuss these results.    OBESITY BEHAVIORAL INTERVENTION VISIT  Today's visit was # 1   Starting weight: 194 lbs Starting date: 09/07/18 Today's weight : 194 lbs  Today's date: 09/07/2018 Total lbs lost to date: 0   ASK: We discussed the diagnosis of obesity with Prudence Davidson today and Kendyll agreed to give [place] permission to discuss obesity behavioral modification therapy today.  ASSESS: Kenyada has the diagnosis of obesity and her BMI today is 33.28 Markiesha is in the action stage of change   ADVISE: Mckala was educated on the multiple health risks of obesity as well as the benefit of weight loss to improve her health. She was advised of the need for long term treatment and the importance of lifestyle modifications to improve her current health and to decrease her risk of future health problems.  AGREE: Multiple dietary modification options and treatment options were discussed and  Jiah agreed to follow the recommendations documented in the above note.  ARRANGE: Joyceann was educated on the importance of frequent visits to treat obesity as outlined per CMS and USPSTF guidelines and agreed to schedule her next follow up appointment today.  I, Doreene Nest, am acting as transcriptionist for Eber Jones, MD  I have reviewed the above documentation for accuracy and completeness, and I agree  with the above. - Ilene Qua, MD

## 2018-09-21 ENCOUNTER — Ambulatory Visit (INDEPENDENT_AMBULATORY_CARE_PROVIDER_SITE_OTHER): Payer: 59 | Admitting: Family Medicine

## 2018-09-21 VITALS — BP 119/81 | HR 79 | Temp 98.5°F | Ht 64.0 in | Wt 191.0 lb

## 2018-09-21 DIAGNOSIS — Z9189 Other specified personal risk factors, not elsewhere classified: Secondary | ICD-10-CM | POA: Diagnosis not present

## 2018-09-21 DIAGNOSIS — E669 Obesity, unspecified: Secondary | ICD-10-CM

## 2018-09-21 DIAGNOSIS — Z6832 Body mass index (BMI) 32.0-32.9, adult: Secondary | ICD-10-CM

## 2018-09-21 DIAGNOSIS — E559 Vitamin D deficiency, unspecified: Secondary | ICD-10-CM

## 2018-09-21 DIAGNOSIS — E8881 Metabolic syndrome: Secondary | ICD-10-CM

## 2018-09-21 DIAGNOSIS — E7849 Other hyperlipidemia: Secondary | ICD-10-CM

## 2018-09-21 MED ORDER — VITAMIN D (ERGOCALCIFEROL) 1.25 MG (50000 UNIT) PO CAPS: 50000.0000 [IU] | ORAL_CAPSULE | ORAL | 0 refills | Status: DC

## 2018-09-22 NOTE — Progress Notes (Signed)
Office: [phone]  /  Fax: [phone]   HPI:   Chief Complaint: OBESITY Ashlee Swanson is here to discuss her progress with her obesity treatment plan. She is on the Category 2 plan + 100 calories and is following her eating plan approximately 90 % of the time. She states she is walking for 30 minutes 1 times per week. Ashlee Swanson is eating up to 8 oz of meat at dinner. She notes her hunger is controlled. She went a little overboard on snacks, and she is doing some meat as a snack to take away from dinner portion.  Her weight is 191 lb (86.6 kg) today and has had a weight loss of 3 pounds over a period of 2 weeks since her last visit. She has lost 3 lbs since starting treatment with [place].  Hyperlipidemia Ashlee Swanson has hyperlipidemia and has been trying to improve her cholesterol levels with intensive lifestyle modification including a low saturated fat diet, exercise and weight loss. Elevated LDL (but improved from previously). She denies any chest pain, claudication or myalgias.  Vitamin D Deficiency Ashlee Swanson has a diagnosis of vitamin D deficiency. She is not on OTC Vit D currently and denies nausea, vomiting or muscle weakness.  At risk for osteopenia and osteoporosis Ashlee Swanson is at higher risk of osteopenia and osteoporosis due to vitamin D deficiency.   Insulin Resistance Ashlee Swanson has a diagnosis of insulin resistance based on her elevated fasting insulin level >5. Slight elevation in insulin level, and she denies carbohydrate cravings or hypoglycemia. Although Ashlee Swanson's blood glucose readings are still under good control, insulin resistance puts her at greater risk of metabolic syndrome and diabetes. She is not taking metformin currently and continues to work on diet and exercise to decrease risk of diabetes.  ALLERGIES: Allergies  Allergen Reactions  . Minocycline Hives  . Codeine Nausea Only    Abdominal pain, shakes  . Tetracycline Hives    MEDICATIONS: Current Outpatient Medications on File  Prior to Visit  Medication Sig Dispense Refill  . Eszopiclone 3 MG TABS Take 1 tablet (3 mg total) by mouth at bedtime as needed. Take immediately before bedtime 30 tablet 0  . Ibuprofen-diphenhydrAMINE Cit (ADVIL PM PO) Take by mouth.    . polyethylene glycol (MIRALAX / GLYCOLAX) packet Take 17 g by mouth daily.     No current facility-administered medications on file prior to visit.     PAST MEDICAL HISTORY: Past Medical History:  Diagnosis Date  . Arthritis    In the Back  . Back pain   . Bunion   . DDD (degenerative disc disease), lumbar   . High cholesterol   . IBS (irritable bowel syndrome)   . Insomnia   . Kidney stone   . Lactose intolerance   . Snoring     PAST SURGICAL HISTORY: Past Surgical History:  Procedure Laterality Date  . BUNIONECTOMY Left 1999   modified mcbride bunionectony    SOCIAL HISTORY: Social History   Tobacco Use  . Smoking status: Former Smoker    Packs/day: 0.25    Years: 10.00    Pack years: 2.50    Last attempt to quit: 05/29/2004    Years since quitting: 14.3  . Smokeless tobacco: Never Used  Substance Use Topics  . Alcohol use: Yes    Alcohol/week: 0.0 standard drinks    Comment: socially  . Drug use: No    FAMILY HISTORY: Family History  Problem Relation Age of Onset  . Colon cancer Paternal Aunt   .  Breast cancer Maternal Aunt   . Heart disease Unknown   . Stroke Maternal Grandmother   . Hypertension Mother   . Hypertension Father   . Hypertension Brother   . Stroke Paternal Grandfather     ROS: Review of Systems  Constitutional: Positive for weight loss.  Cardiovascular: Negative for chest pain and claudication.  Gastrointestinal: Negative for nausea and vomiting.  Musculoskeletal: Negative for myalgias.       Negative muscle weakness  Endo/Heme/Allergies:       Negative hypoglycemia    PHYSICAL EXAM: Blood pressure 119/81, pulse 79, temperature 98.5 F (36.9 C), temperature source Oral, height 5\' 4"  (1.626  m), weight 191 lb (86.6 kg), SpO2 97 %. Body mass index is 32.79 kg/m. Physical Exam  Constitutional: She is oriented to person, place, and time. She appears well-developed and well-nourished.  Cardiovascular: Normal rate.  Pulmonary/Chest: Effort normal.  Musculoskeletal: Normal range of motion.  Neurological: She is oriented to person, place, and time.  Skin: Skin is warm and dry.  Psychiatric: She has a normal mood and affect. Her behavior is normal.  Vitals reviewed.   RECENT LABS AND TESTS: BMET    Component Value Date/Time   NA 139 06/09/2018 0903   K 4.2 06/09/2018 0903   CL 105 06/09/2018 0903   CO2 27 06/09/2018 0903   GLUCOSE 90 06/09/2018 0903   BUN 13 06/09/2018 0903   CREATININE 0.88 06/09/2018 0903   CALCIUM 9.5 06/09/2018 0903   GFRNONAA 74 12/31/2008 1110   GFRAA 90 12/31/2008 1110   Lab Results  Component Value Date   HGBA1C 5.2 09/07/2018   Lab Results  Component Value Date   INSULIN 6.8 09/07/2018   CBC    Component Value Date/Time   WBC 7.9 06/09/2018 0903   RBC 4.11 06/09/2018 0903   HGB 14.2 06/09/2018 0903   HCT 40.7 06/09/2018 0903   PLT 353.0 06/09/2018 0903   MCV 99.1 06/09/2018 0903   MCHC 34.9 06/09/2018 0903   RDW 12.3 06/09/2018 0903   LYMPHSABS 2.7 06/09/2018 0903   MONOABS 0.6 06/09/2018 0903   EOSABS 0.1 06/09/2018 0903   BASOSABS 0.1 06/09/2018 0903   Iron/TIBC/Ferritin/ %Sat No results found for: IRON, TIBC, FERRITIN, IRONPCTSAT Lipid Panel     Component Value Date/Time   CHOL 168 09/07/2018 1245   TRIG 81 09/07/2018 1245   HDL 51 09/07/2018 1245   CHOLHDL 4 06/09/2018 0903   VLDL 19.4 06/09/2018 0903   LDLCALC 101 (H) 09/07/2018 1245   Hepatic Function Panel     Component Value Date/Time   PROT 7.0 06/09/2018 0903   ALBUMIN 4.3 06/09/2018 0903   AST 12 06/09/2018 0903   ALT 12 06/09/2018 0903   ALKPHOS 51 06/09/2018 0903   BILITOT 0.5 06/09/2018 0903   BILIDIR 0.1 12/31/2010 0933      Component Value  Date/Time   TSH 2.04 06/09/2018 0903   TSH 1.68 06/03/2017 1044   TSH 1.57 05/29/2016 1036  Results for Ashlee, Swanson (MRN [id number]) as of 09/22/2018 13:30  Ref. Range 09/07/2018 12:45  Vitamin D, 25-Hydroxy Latest Ref Range: 30.0 - 100.0 ng/mL 38.7    ASSESSMENT AND PLAN: Other hyperlipidemia  Vitamin D deficiency - Plan: Vitamin D, Ergocalciferol, (DRISDOL) 50000 units CAPS capsule  Insulin resistance  At risk for osteoporosis  Class 1 obesity with serious comorbidity and body mass index (BMI) of 32.0 to 32.9 in adult, unspecified obesity type  PLAN:  Hyperlipidemia Calayah was informed of the  American Heart Association Guidelines emphasizing intensive lifestyle modifications as the first line treatment for hyperlipidemia. We discussed many lifestyle modifications today in depth, and Tanya will continue to work on decreasing saturated fats such as fatty red meat, butter and many fried foods. She will also increase vegetables and lean protein in her diet and continue to work on exercise and weight loss efforts. We will repeat labs in 3 months. Shivangi agrees to follow up with our clinic in 2 weeks.  Vitamin D Deficiency Yumiko was informed that low vitamin D levels contributes to fatigue and are associated with obesity, breast, and colon cancer. Ashlee Swanson agrees to start prescription Vit D @50 ,000 IU every week #4 with no refills. She will follow up for routine testing of vitamin D, at least 2-3 times per year. She was informed of the risk of over-replacement of vitamin D and agrees to not increase her dose unless she discusses this with [place] first. We will repeat labs in 3 months. Ashlee Swanson agrees to follow up with our clinic in 2 weeks.  At risk for osteopenia and osteoporosis Ashlee Swanson was given extended (30 minutes) osteoporosis prevention counseling today. Ashlee Swanson is at risk for osteopenia and osteoporsis due to her vitamin D deficiency. She was encouraged to take her vitamin D and  follow her higher calcium diet and increase strengthening exercise to help strengthen her bones and decrease her risk of osteopenia and osteoporosis.  Insulin Resistance Ashlee Swanson will continue to work on weight loss, exercise, and decreasing simple carbohydrates in her diet to help decrease the risk of diabetes. We dicussed metformin including benefits and risks. She was informed that eating too many simple carbohydrates or too many calories at one sitting increases the likelihood of GI side effects. Zaidy declined metformin for now and prescription was not written today. We will repeat labs in 3 months. Ashlee Swanson agrees to follow up with our clinic in 2 weeks as directed to monitor her progress.  Obesity Ashlee Swanson is currently in the action stage of change. As such, her goal is to continue with weight loss efforts She has agreed to follow the Category 2 plan + 100 calories Ashlee Swanson has been instructed to work up to a goal of 150 minutes of combined cardio and strengthening exercise per week for weight loss and overall health benefits. We discussed the following Behavioral Modification Strategies today: increasing lean protein intake, increasing vegetables, work on meal planning and easy cooking plans, and planning for success   Ashlee Swanson has agreed to follow up with our clinic in 2 weeks. She was informed of the importance of frequent follow up visits to maximize her success with intensive lifestyle modifications for her multiple health conditions.   OBESITY BEHAVIORAL INTERVENTION VISIT  Today's visit was # 2   Starting weight: 194 lbs Starting date: 09/07/18 Today's weight : 191 lbs  Today's date: 09/21/2018 Total lbs lost to date: 3    ASK: We discussed the diagnosis of obesity with Ashlee Swanson today and Ashlee Swanson agreed to give [place] permission to discuss obesity behavioral modification therapy today.  ASSESS: Ashlee Swanson has the diagnosis of obesity and her BMI today is 32.77 Ashlee Swanson is in  the action stage of change   ADVISE: Ashlee Swanson was educated on the multiple health risks of obesity as well as the benefit of weight loss to improve her health. She was advised of the need for long term treatment and the importance of lifestyle modifications to improve her current health and to decrease her risk of  future health problems.  AGREE: Multiple dietary modification options and treatment options were discussed and  Ashlee Swanson agreed to follow the recommendations documented in the above note.  ARRANGE: Ashlee Swanson was educated on the importance of frequent visits to treat obesity as outlined per CMS and USPSTF guidelines and agreed to schedule her next follow up appointment today.  I, Trixie Dredge, am acting as transcriptionist for Ilene Qua, MD  I have reviewed the above documentation for accuracy and completeness, and I agree with the above. - Ilene Qua, MD

## 2018-10-06 ENCOUNTER — Ambulatory Visit (INDEPENDENT_AMBULATORY_CARE_PROVIDER_SITE_OTHER): Payer: 59 | Admitting: Family Medicine

## 2018-10-06 VITALS — BP 108/60 | HR 60 | Temp 98.4°F | Ht 64.0 in | Wt 188.0 lb

## 2018-10-06 DIAGNOSIS — E669 Obesity, unspecified: Secondary | ICD-10-CM | POA: Diagnosis not present

## 2018-10-06 DIAGNOSIS — K5909 Other constipation: Secondary | ICD-10-CM | POA: Diagnosis not present

## 2018-10-06 DIAGNOSIS — Z9189 Other specified personal risk factors, not elsewhere classified: Secondary | ICD-10-CM

## 2018-10-06 DIAGNOSIS — Z6832 Body mass index (BMI) 32.0-32.9, adult: Secondary | ICD-10-CM

## 2018-10-06 DIAGNOSIS — E559 Vitamin D deficiency, unspecified: Secondary | ICD-10-CM

## 2018-10-06 MED ORDER — VITAMIN D (ERGOCALCIFEROL) 1.25 MG (50000 UNIT) PO CAPS: 50000.0000 [IU] | ORAL_CAPSULE | ORAL | 0 refills | Status: DC

## 2018-10-11 NOTE — Progress Notes (Signed)
Office: [phone]  /  Fax: [phone]   HPI:   Chief Complaint: OBESITY Ashlee Swanson is here to discuss her progress with her obesity treatment plan. She is on the Category 2 plan + 100 calories and is following her eating plan approximately 75 % of the time. She states she is walking for 30 minutes 1 time per week. Ashlee Swanson had a trip to [place] with her girlfriends during the past weekend and got back on track after. She noticed if she didn't eat enough protein her snacking would increase.  Her weight is 188 lb (85.3 kg) today and has had a weight loss of 3 pounds over a period of 2 weeks since her last visit. She has lost 6 lbs since starting treatment with [place].  Vitamin D Deficiency Ashlee Swanson has a diagnosis of vitamin D deficiency. She is currently taking prescription Vit D. She notes fatigue and denies nausea, vomiting or muscle weakness.  At risk for osteopenia and osteoporosis Ashlee Swanson is at higher risk of osteopenia and osteoporosis due to vitamin D deficiency.   Constipation Ashlee Swanson notes constipation, but she states BM have improved. She is taking miralax and BM are not hard and painful. She denies hematochezia or melena. She denies drinking less H20 recently.  ALLERGIES: Allergies  Allergen Reactions  . Minocycline Hives  . Codeine Nausea Only    Abdominal pain, shakes  . Tetracycline Hives    MEDICATIONS: Current Outpatient Medications on File Prior to Visit  Medication Sig Dispense Refill  . Eszopiclone 3 MG TABS Take 1 tablet (3 mg total) by mouth at bedtime as needed. Take immediately before bedtime 30 tablet 0  . Ibuprofen-diphenhydrAMINE Cit (ADVIL PM PO) Take by mouth.    . polyethylene glycol (MIRALAX / GLYCOLAX) packet Take 17 g by mouth daily.     No current facility-administered medications on file prior to visit.     PAST MEDICAL HISTORY: Past Medical History:  Diagnosis Date  . Arthritis    In the Back  . Back pain   . Bunion   . DDD (degenerative disc  disease), lumbar   . High cholesterol   . IBS (irritable bowel syndrome)   . Insomnia   . Kidney stone   . Lactose intolerance   . Snoring     PAST SURGICAL HISTORY: Past Surgical History:  Procedure Laterality Date  . BUNIONECTOMY Left 1999   modified mcbride bunionectony    SOCIAL HISTORY: Social History   Tobacco Use  . Smoking status: Former Smoker    Packs/day: 0.25    Years: 10.00    Pack years: 2.50    Last attempt to quit: 05/29/2004    Years since quitting: 14.3  . Smokeless tobacco: Never Used  Substance Use Topics  . Alcohol use: Yes    Alcohol/week: 0.0 standard drinks    Comment: socially  . Drug use: No    FAMILY HISTORY: Family History  Problem Relation Age of Onset  . Colon cancer Paternal Aunt   . Breast cancer Maternal Aunt   . Heart disease Unknown   . Stroke Maternal Grandmother   . Hypertension Mother   . Hypertension Father   . Hypertension Brother   . Stroke Paternal Grandfather     ROS: Review of Systems  Constitutional: Positive for malaise/fatigue and weight loss.  Gastrointestinal: Positive for constipation. Negative for melena, nausea and vomiting.       Negative hematochezia  Musculoskeletal:       Negative muscle weakness  PHYSICAL EXAM: Blood pressure 108/60, pulse 60, temperature 98.4 F (36.9 C), temperature source Oral, height 5\' 4"  (1.626 m), weight 188 lb (85.3 kg), SpO2 98 %. Body mass index is 32.27 kg/m. Physical Exam  Constitutional: She is oriented to person, place, and time. She appears well-developed and well-nourished.  Cardiovascular: Normal rate.  Pulmonary/Chest: Effort normal.  Musculoskeletal: Normal range of motion.  Neurological: She is oriented to person, place, and time.  Skin: Skin is warm and dry.  Psychiatric: She has a normal mood and affect. Her behavior is normal.  Vitals reviewed.   RECENT LABS AND TESTS: BMET    Component Value Date/Time   NA 139 06/09/2018 0903   K 4.2 06/09/2018  0903   CL 105 06/09/2018 0903   CO2 27 06/09/2018 0903   GLUCOSE 90 06/09/2018 0903   BUN 13 06/09/2018 0903   CREATININE 0.88 06/09/2018 0903   CALCIUM 9.5 06/09/2018 0903   GFRNONAA 74 12/31/2008 1110   GFRAA 90 12/31/2008 1110   Lab Results  Component Value Date   HGBA1C 5.2 09/07/2018   Lab Results  Component Value Date   INSULIN 6.8 09/07/2018   CBC    Component Value Date/Time   WBC 7.9 06/09/2018 0903   RBC 4.11 06/09/2018 0903   HGB 14.2 06/09/2018 0903   HCT 40.7 06/09/2018 0903   PLT 353.0 06/09/2018 0903   MCV 99.1 06/09/2018 0903   MCHC 34.9 06/09/2018 0903   RDW 12.3 06/09/2018 0903   LYMPHSABS 2.7 06/09/2018 0903   MONOABS 0.6 06/09/2018 0903   EOSABS 0.1 06/09/2018 0903   BASOSABS 0.1 06/09/2018 0903   Iron/TIBC/Ferritin/ %Sat No results found for: IRON, TIBC, FERRITIN, IRONPCTSAT Lipid Panel     Component Value Date/Time   CHOL 168 09/07/2018 1245   TRIG 81 09/07/2018 1245   HDL 51 09/07/2018 1245   CHOLHDL 4 06/09/2018 0903   VLDL 19.4 06/09/2018 0903   LDLCALC 101 (H) 09/07/2018 1245   Hepatic Function Panel     Component Value Date/Time   PROT 7.0 06/09/2018 0903   ALBUMIN 4.3 06/09/2018 0903   AST 12 06/09/2018 0903   ALT 12 06/09/2018 0903   ALKPHOS 51 06/09/2018 0903   BILITOT 0.5 06/09/2018 0903   BILIDIR 0.1 12/31/2010 0933      Component Value Date/Time   TSH 2.04 06/09/2018 0903   TSH 1.68 06/03/2017 1044   TSH 1.57 05/29/2016 1036  Results for TONNI, MANSOUR (MRN [id number]) as of 10/11/2018 16:20  Ref. Range 09/07/2018 12:45  Vitamin D, 25-Hydroxy Latest Ref Range: 30.0 - 100.0 ng/mL 38.7    ASSESSMENT AND PLAN: Vitamin D deficiency - Plan: Vitamin D, Ergocalciferol, (DRISDOL) 50000 units CAPS capsule  Other constipation  At risk for osteoporosis  Class 1 obesity with serious comorbidity and body mass index (BMI) of 32.0 to 32.9 in adult, unspecified obesity type  PLAN:  Vitamin D Deficiency Ashlee Swanson was  informed that low vitamin D levels contributes to fatigue and are associated with obesity, breast, and colon cancer. Ashlee Swanson agrees to continue taking prescription Vit D @50 ,000 IU every week #4 and we will refill for 1 month. She will follow up for routine testing of vitamin D, at least 2-3 times per year. She was informed of the risk of over-replacement of vitamin D and agrees to not increase her dose unless she discusses this with [place] first. Ashlee Swanson agrees to follow up with our clinic in 2 weeks.  At risk for osteopenia and osteoporosis  Ashlee Swanson was given extended (15 minutes) osteoporosis prevention counseling today. Ashlee Swanson is at risk for osteopenia and osteoporsis due to her vitamin D deficiency. She was encouraged to take her vitamin D and follow her higher calcium diet and increase strengthening exercise to help strengthen her bones and decrease her risk of osteopenia and osteoporosis.  Constipation Ashlee Swanson was informed decrease bowel movement frequency is normal while losing weight, but stools should not be hard or painful. She was advised to increase her H20 intake and work on increasing her fiber intake. High fiber foods were discussed today. Ashlee Swanson agrees to continue taking miralax and she agrees to follow up with our clinic in 2 weeks.  Obesity Ashlee Swanson is currently in the action stage of change. As such, her goal is to continue with weight loss efforts She has agreed to follow the Category 2 plan or follow our protein rich vegetarian plan Ashlee Swanson has been instructed to work up to a goal of 150 minutes of combined cardio and strengthening exercise per week for weight loss and overall health benefits. We discussed the following Behavioral Modification Strategies today: increasing lean protein intake, increasing vegetables, work on meal planning and easy cooking plans, and planning for success   Ashlee Swanson has agreed to follow up with our clinic in 2 weeks. She was informed of the importance of  frequent follow up visits to maximize her success with intensive lifestyle modifications for her multiple health conditions.   OBESITY BEHAVIORAL INTERVENTION VISIT  Today's visit was # 3   Starting weight: 194 lbs Starting date: 09/07/18 Today's weight : 188 lbs Today's date: 10/06/2018 Total lbs lost to date: 6    ASK: We discussed the diagnosis of obesity with Ashlee Swanson today and Ashlee Swanson agreed to give [place] permission to discuss obesity behavioral modification therapy today.  ASSESS: Ashlee Swanson has the diagnosis of obesity and her BMI today is 32.25 Ashlee Swanson is in the action stage of change   ADVISE: Ashlee Swanson was educated on the multiple health risks of obesity as well as the benefit of weight loss to improve her health. She was advised of the need for long term treatment and the importance of lifestyle modifications to improve her current health and to decrease her risk of future health problems.  AGREE: Multiple dietary modification options and treatment options were discussed and  Kylieann agreed to follow the recommendations documented in the above note.  ARRANGE: Tionna was educated on the importance of frequent visits to treat obesity as outlined per CMS and USPSTF guidelines and agreed to schedule her next follow up appointment today.  I, Trixie Dredge, am acting as transcriptionist for Ashlee Qua, MD  I have reviewed the above documentation for accuracy and completeness, and I agree with the above. - Ashlee Qua, MD

## 2018-10-19 ENCOUNTER — Ambulatory Visit (INDEPENDENT_AMBULATORY_CARE_PROVIDER_SITE_OTHER): Payer: 59 | Admitting: Family Medicine

## 2018-10-19 ENCOUNTER — Encounter (INDEPENDENT_AMBULATORY_CARE_PROVIDER_SITE_OTHER): Payer: Self-pay | Admitting: Family Medicine

## 2018-10-19 VITALS — BP 129/83 | HR 64 | Temp 98.1°F | Ht 64.0 in | Wt 187.0 lb

## 2018-10-19 DIAGNOSIS — Z9189 Other specified personal risk factors, not elsewhere classified: Secondary | ICD-10-CM | POA: Diagnosis not present

## 2018-10-19 DIAGNOSIS — K5909 Other constipation: Secondary | ICD-10-CM

## 2018-10-19 DIAGNOSIS — E8881 Metabolic syndrome: Secondary | ICD-10-CM | POA: Diagnosis not present

## 2018-10-19 DIAGNOSIS — E559 Vitamin D deficiency, unspecified: Secondary | ICD-10-CM | POA: Diagnosis not present

## 2018-10-19 DIAGNOSIS — E88819 Insulin resistance, unspecified: Secondary | ICD-10-CM

## 2018-10-19 DIAGNOSIS — E669 Obesity, unspecified: Secondary | ICD-10-CM

## 2018-10-19 DIAGNOSIS — E66811 Obesity, class 1: Secondary | ICD-10-CM

## 2018-10-19 DIAGNOSIS — Z6832 Body mass index (BMI) 32.0-32.9, adult: Secondary | ICD-10-CM

## 2018-10-19 NOTE — Progress Notes (Signed)
Office: [phone]  /  Fax: [phone]   HPI:   Chief Complaint: OBESITY Ashlee Swanson is here to discuss her progress with her obesity treatment plan. She is on the  follow the Category 2 plan and is following her eating plan approximately 75 % of the time. She states she is exercising by walking for 40 minutes 1 times per week. Ashlee Swanson is not always eating all protein. She is increasing snacking at night when she does not get enough protein. She is struggling with keeping healthy food in the house.  Her weight is 187 lb (84.8 kg) today and has had a weight loss of 1 pounds over a period of 2 weeks since her last visit. She has lost 7 lbs since starting treatment with [place].  Vitamin D deficiency Ashlee Swanson has a diagnosis of vitamin D deficiency. She is currently taking vit D and denies nausea, vomiting or muscle weakness.  Ref. Range 09/07/2018 12:45  Vitamin D, 25-Hydroxy Latest Ref Range: 30.0 - 100.0 ng/mL 38.7  At risk for osteopenia and osteoporosis Ashlee Swanson is at higher risk of osteopenia and osteoporosis due to vitamin D deficiency.   Insulin Resistance Ashlee Swanson has a diagnosis of insulin resistance based on her elevated fasting insulin level >5. Although Ashlee Swanson's blood glucose readings are still under good control, insulin resistance puts her at greater risk of metabolic syndrome and diabetes. She is not taking metformin currently and continues to work on diet and exercise to decrease risk of diabetes. She denies polyphagia.   Constipation  Ashlee Swanson notes constipation for the last few weeks, worse since attempting weight loss. She states BM are less frequent and are not hard and painful. She denies hematochezia or melena. She denies drinking less H20 recently. It has improved. She has increased vegetables.    ALLERGIES: Allergies  Allergen Reactions  . Minocycline Hives  . Codeine Nausea Only    Abdominal pain, shakes  . Tetracycline Hives    MEDICATIONS: Current Outpatient Medications  on File Prior to Visit  Medication Sig Dispense Refill  . Eszopiclone 3 MG TABS Take 1 tablet (3 mg total) by mouth at bedtime as needed. Take immediately before bedtime 30 tablet 0  . Ibuprofen-diphenhydrAMINE Cit (ADVIL PM PO) Take by mouth.    . polyethylene glycol (MIRALAX / GLYCOLAX) packet Take 17 g by mouth daily.    . Vitamin D, Ergocalciferol, (DRISDOL) 50000 units CAPS capsule Take 1 capsule (50,000 Units total) by mouth every 7 (seven) days. 4 capsule 0   No current facility-administered medications on file prior to visit.     PAST MEDICAL HISTORY: Past Medical History:  Diagnosis Date  . Arthritis    In the Back  . Back pain   . Bunion   . DDD (degenerative disc disease), lumbar   . High cholesterol   . IBS (irritable bowel syndrome)   . Insomnia   . Kidney stone   . Lactose intolerance   . Snoring     PAST SURGICAL HISTORY: Past Surgical History:  Procedure Laterality Date  . BUNIONECTOMY Left 1999   modified mcbride bunionectony    SOCIAL HISTORY: Social History   Tobacco Use  . Smoking status: Former Smoker    Packs/day: 0.25    Years: 10.00    Pack years: 2.50    Last attempt to quit: 05/29/2004    Years since quitting: 14.4  . Smokeless tobacco: Never Used  Substance Use Topics  . Alcohol use: Yes    Alcohol/week: 0.0 standard  drinks    Comment: socially  . Drug use: No    FAMILY HISTORY: Family History  Problem Relation Age of Onset  . Colon cancer Paternal Aunt   . Breast cancer Maternal Aunt   . Heart disease Unknown   . Stroke Maternal Grandmother   . Hypertension Mother   . Hypertension Father   . Hypertension Brother   . Stroke Paternal Grandfather     ROS: Review of Systems  Constitutional: Positive for weight loss.  Gastrointestinal: Negative for melena, nausea and vomiting.       Negative for hematochezia  Musculoskeletal:       Negative for muscle weakness  Endo/Heme/Allergies:       She denies polyphagia    PHYSICAL  EXAM: Blood pressure 129/83, pulse 64, temperature 98.1 F (36.7 C), temperature source Oral, height 5\' 4"  (1.626 m), weight 187 lb (84.8 kg), SpO2 98 %. Body mass index is 32.1 kg/m. Physical Exam  Constitutional: She is oriented to person, place, and time. She appears well-developed and well-nourished.  HENT:  Head: Normocephalic.  Neck: Normal range of motion.  Cardiovascular: Normal rate.  Pulmonary/Chest: Effort normal.  Musculoskeletal: Normal range of motion.  Neurological: She is alert and oriented to person, place, and time.  Skin: Skin is warm and dry.  Psychiatric: She has a normal mood and affect. Her behavior is normal.  Vitals reviewed.   RECENT LABS AND TESTS: BMET    Component Value Date/Time   NA 139 06/09/2018 0903   K 4.2 06/09/2018 0903   CL 105 06/09/2018 0903   CO2 27 06/09/2018 0903   GLUCOSE 90 06/09/2018 0903   BUN 13 06/09/2018 0903   CREATININE 0.88 06/09/2018 0903   CALCIUM 9.5 06/09/2018 0903   GFRNONAA 74 12/31/2008 1110   GFRAA 90 12/31/2008 1110   Lab Results  Component Value Date   HGBA1C 5.2 09/07/2018   Lab Results  Component Value Date   INSULIN 6.8 09/07/2018   CBC    Component Value Date/Time   WBC 7.9 06/09/2018 0903   RBC 4.11 06/09/2018 0903   HGB 14.2 06/09/2018 0903   HCT 40.7 06/09/2018 0903   PLT 353.0 06/09/2018 0903   MCV 99.1 06/09/2018 0903   MCHC 34.9 06/09/2018 0903   RDW 12.3 06/09/2018 0903   LYMPHSABS 2.7 06/09/2018 0903   MONOABS 0.6 06/09/2018 0903   EOSABS 0.1 06/09/2018 0903   BASOSABS 0.1 06/09/2018 0903   Iron/TIBC/Ferritin/ %Sat No results found for: IRON, TIBC, FERRITIN, IRONPCTSAT Lipid Panel     Component Value Date/Time   CHOL 168 09/07/2018 1245   TRIG 81 09/07/2018 1245   HDL 51 09/07/2018 1245   CHOLHDL 4 06/09/2018 0903   VLDL 19.4 06/09/2018 0903   LDLCALC 101 (H) 09/07/2018 1245   Hepatic Function Panel     Component Value Date/Time   PROT 7.0 06/09/2018 0903   ALBUMIN 4.3  06/09/2018 0903   AST 12 06/09/2018 0903   ALT 12 06/09/2018 0903   ALKPHOS 51 06/09/2018 0903   BILITOT 0.5 06/09/2018 0903   BILIDIR 0.1 12/31/2010 0933      Component Value Date/Time   TSH 2.04 06/09/2018 0903   TSH 1.68 06/03/2017 1044   TSH 1.57 05/29/2016 1036    Ref. Range 09/07/2018 12:45  Vitamin D, 25-Hydroxy Latest Ref Range: 30.0 - 100.0 ng/mL 38.7    ASSESSMENT AND PLAN: Vitamin D deficiency  Insulin resistance  Other constipation  At risk for osteoporosis  Class 1  obesity with serious comorbidity and body mass index (BMI) of 32.0 to 32.9 in adult, unspecified obesity type  PLAN: Vitamin D Deficiency Ashlee Swanson was informed that low vitamin D levels contributes to fatigue and are associated with obesity, breast, and colon cancer. She agrees to continue to take prescription Vit D @50 ,000 IU every week and will follow up for routine testing of vitamin D, at least 2-3 times per year. She was informed of the risk of over-replacement of vitamin D and agrees to not increase her dose unless she discusses this with [place] first. Agrees to follow up with our clinic as directed.   At risk for osteopenia and osteoporosis Ashlee Swanson was given extended  (15 minutes) osteoporosis prevention counseling today. Ashlee Swanson is at risk for osteopenia and osteoporosis due to her vitamin D deficiency. She was encouraged to take her vitamin D and follow her higher calcium diet and increase strengthening exercise to help strengthen her bones and decrease her risk of osteopenia and osteoporosis.  Insulin Resistance Ashlee Swanson will continue to work on weight loss, exercise, and decreasing simple carbohydrates in her diet to help decrease the risk of diabetes. We dicussed metformin including benefits and risks. She was informed that eating too many simple carbohydrates or too many calories at one sitting increases the likelihood of GI side effects. Ashlee Swanson declined metformin for now and prescription was not written  today. Ashlee Swanson agreed to follow up with [place] as directed to monitor her progress.  Constipation Ashlee Swanson was informed decrease bowel movement frequency is normal while losing weight, but stools should not be hard or painful. She was advised to increase her H20 intake, increase vegetables, and work on increasing her fiber intake. High fiber foods were discussed today. She agrees to take Miralax as needed daily.    Obesity Ashlee Swanson is currently in the action stage of change. As such, her goal is to continue with weight loss efforts She has agreed to follow the Category 2 plan Ashlee Swanson has been instructed to work up to a goal of 150 minutes of combined cardio and strengthening exercise or continue walking for 40 minutes 1 time per week for weight loss and overall health benefits. We discussed the following Behavioral Modification Strategies today: increasing lean protein intake, keeping healthy foods in the home, and planning for success.    Ashlee Swanson has agreed to follow up with our clinic in 2 weeks. She was informed of the importance of frequent follow up visits to maximize her success with intensive lifestyle modifications for her multiple health conditions.   OBESITY BEHAVIORAL INTERVENTION VISIT  Today's visit was # 4   Starting weight: 194 lb Starting date: 09/07/18 Today's weight : 187 lb Today's date: 10/19/2018 Total lbs lost to date: 7 lb  ASK: We discussed the diagnosis of obesity with Ashlee Swanson today and Ashlee Swanson agreed to give [place] permission to discuss obesity behavioral modification therapy today.  ASSESS: Ashlee Swanson has the diagnosis of obesity and her BMI today is 32.08 Ashlee Swanson is in the action stage of change   ADVISE: Ashlee Swanson was educated on the multiple health risks of obesity as well as the benefit of weight loss to improve her health. She was advised of the need for long term treatment and the importance of lifestyle modifications to improve her current health and to  decrease her risk of future health problems.  AGREE: Multiple dietary modification options and treatment options were discussed and  Ashlee Swanson agreed to follow the recommendations documented in the above note.  ARRANGE: Ashlee Swanson was educated on the importance of frequent visits to treat obesity as outlined per CMS and USPSTF guidelines and agreed to schedule her next follow up appointment today.  Leary Roca, am acting as transcriptionist for [employer], FNP  I have reviewed the above documentation for accuracy and completeness, and I agree with the above. [employer], FNP-C.

## 2018-10-20 ENCOUNTER — Encounter (INDEPENDENT_AMBULATORY_CARE_PROVIDER_SITE_OTHER): Payer: Self-pay | Admitting: Family Medicine

## 2018-10-20 ENCOUNTER — Other Ambulatory Visit: Payer: Self-pay | Admitting: Family Medicine

## 2018-10-21 ENCOUNTER — Other Ambulatory Visit: Payer: Self-pay | Admitting: Family Medicine

## 2018-10-24 MED ORDER — ESZOPICLONE 3 MG PO TABS: 3.0000 mg | ORAL_TABLET | Freq: Every evening | ORAL | 0 refills | Status: DC | PRN

## 2018-10-24 NOTE — Telephone Encounter (Signed)
Refill request for Lunesta.   Last OV: 06/09/2018  Last Fill: 05/25/2018 #30 and 0RF UDS: 06/09/2018 Low risk

## 2018-11-02 ENCOUNTER — Ambulatory Visit (INDEPENDENT_AMBULATORY_CARE_PROVIDER_SITE_OTHER): Payer: 59 | Admitting: Family Medicine

## 2018-11-02 ENCOUNTER — Encounter (INDEPENDENT_AMBULATORY_CARE_PROVIDER_SITE_OTHER): Payer: Self-pay | Admitting: Family Medicine

## 2018-11-02 VITALS — BP 120/84 | HR 65 | Temp 98.4°F | Ht 64.0 in | Wt 185.0 lb

## 2018-11-02 DIAGNOSIS — E8881 Metabolic syndrome: Secondary | ICD-10-CM | POA: Diagnosis not present

## 2018-11-02 DIAGNOSIS — Z6831 Body mass index (BMI) 31.0-31.9, adult: Secondary | ICD-10-CM

## 2018-11-02 DIAGNOSIS — E669 Obesity, unspecified: Secondary | ICD-10-CM

## 2018-11-02 DIAGNOSIS — K5909 Other constipation: Secondary | ICD-10-CM

## 2018-11-07 ENCOUNTER — Encounter (INDEPENDENT_AMBULATORY_CARE_PROVIDER_SITE_OTHER): Payer: Self-pay | Admitting: Family Medicine

## 2018-11-07 NOTE — Progress Notes (Signed)
Office: [phone]  /  Fax: [phone]   HPI:   Chief Complaint: OBESITY Ashlee Swanson is here to discuss her progress with her obesity treatment plan. She is on the Category 2 plan and is following her eating plan approximately 40 to 50 % of the time. She states she is walking 30 minutes 1 time per week. Ashlee Swanson has had increased stress as she has an upper respiratory infection. She is back on track now and is eating most of her food. She is tired of apples.  Her weight is 185 lb (83.9 kg) today and has had a weight loss of 2 pounds over a period of 2 weeks since her last visit. She has lost 9 lbs since starting treatment with [place].  Constipation Rees notes that her constipation has improved and she has increased vegetables in her diet. She denies hematochezia or melena. She has not needed Miralax.  Insulin Resistance Ashlee Swanson has a diagnosis of insulin resistance based on her elevated fasting insulin level >5. Although Ashlee Swanson's blood glucose readings are still under good control, insulin resistance puts her at greater risk of metabolic syndrome and diabetes. She is not taking metformin currently and continues to work on diet and exercise to decrease risk of diabetes. She denies polyphagia.  ALLERGIES: Allergies  Allergen Reactions  . Minocycline Hives  . Codeine Nausea Only    Abdominal pain, shakes  . Tetracycline Hives    MEDICATIONS: Current Outpatient Medications on File Prior to Visit  Medication Sig Dispense Refill  . Eszopiclone 3 MG TABS Take 1 tablet (3 mg total) by mouth at bedtime as needed. Take immediately before bedtime 30 tablet 0  . Ibuprofen-diphenhydrAMINE Cit (ADVIL PM PO) Take by mouth.    . polyethylene glycol (MIRALAX / GLYCOLAX) packet Take 17 g by mouth daily.    . Vitamin D, Ergocalciferol, (DRISDOL) 50000 units CAPS capsule Take 1 capsule (50,000 Units total) by mouth every 7 (seven) days. 4 capsule 0   No current facility-administered medications on file  prior to visit.     PAST MEDICAL HISTORY: Past Medical History:  Diagnosis Date  . Arthritis    In the Back  . Back pain   . Bunion   . DDD (degenerative disc disease), lumbar   . High cholesterol   . IBS (irritable bowel syndrome)   . Insomnia   . Kidney stone   . Lactose intolerance   . Snoring     PAST SURGICAL HISTORY: Past Surgical History:  Procedure Laterality Date  . BUNIONECTOMY Left 1999   modified mcbride bunionectony    SOCIAL HISTORY: Social History   Tobacco Use  . Smoking status: Former Smoker    Packs/day: 0.25    Years: 10.00    Pack years: 2.50    Last attempt to quit: 05/29/2004    Years since quitting: 14.4  . Smokeless tobacco: Never Used  Substance Use Topics  . Alcohol use: Yes    Alcohol/week: 0.0 standard drinks    Comment: socially  . Drug use: No    FAMILY HISTORY: Family History  Problem Relation Age of Onset  . Colon cancer Paternal Aunt   . Breast cancer Maternal Aunt   . Heart disease Unknown   . Stroke Maternal Grandmother   . Hypertension Mother   . Hypertension Father   . Hypertension Brother   . Stroke Paternal Grandfather     ROS: Review of Systems  Constitutional: Positive for weight loss.  Gastrointestinal: Negative for melena.  Negative for hematochezia.  Endo/Heme/Allergies:       Negative for polyphagia.    PHYSICAL EXAM: Blood pressure 120/84, pulse 65, temperature 98.4 F (36.9 C), temperature source Oral, height 5\' 4"  (1.626 m), weight 185 lb (83.9 kg), SpO2 98 %. Body mass index is 31.76 kg/m. Physical Exam  Constitutional: She is oriented to person, place, and time. She appears well-developed and well-nourished.  Cardiovascular: Normal rate.  Pulmonary/Chest: Effort normal.  Musculoskeletal: Normal range of motion.  Neurological: She is oriented to person, place, and time.  Skin: Skin is warm and dry.  Psychiatric: She has a normal mood and affect. Her behavior is normal.  Vitals  reviewed.   RECENT LABS AND TESTS: BMET    Component Value Date/Time   NA 139 06/09/2018 0903   K 4.2 06/09/2018 0903   CL 105 06/09/2018 0903   CO2 27 06/09/2018 0903   GLUCOSE 90 06/09/2018 0903   BUN 13 06/09/2018 0903   CREATININE 0.88 06/09/2018 0903   CALCIUM 9.5 06/09/2018 0903   GFRNONAA 74 12/31/2008 1110   GFRAA 90 12/31/2008 1110   Lab Results  Component Value Date   HGBA1C 5.2 09/07/2018   Lab Results  Component Value Date   INSULIN 6.8 09/07/2018   CBC    Component Value Date/Time   WBC 7.9 06/09/2018 0903   RBC 4.11 06/09/2018 0903   HGB 14.2 06/09/2018 0903   HCT 40.7 06/09/2018 0903   PLT 353.0 06/09/2018 0903   MCV 99.1 06/09/2018 0903   MCHC 34.9 06/09/2018 0903   RDW 12.3 06/09/2018 0903   LYMPHSABS 2.7 06/09/2018 0903   MONOABS 0.6 06/09/2018 0903   EOSABS 0.1 06/09/2018 0903   BASOSABS 0.1 06/09/2018 0903   Iron/TIBC/Ferritin/ %Sat No results found for: IRON, TIBC, FERRITIN, IRONPCTSAT Lipid Panel     Component Value Date/Time   CHOL 168 09/07/2018 1245   TRIG 81 09/07/2018 1245   HDL 51 09/07/2018 1245   CHOLHDL 4 06/09/2018 0903   VLDL 19.4 06/09/2018 0903   LDLCALC 101 (H) 09/07/2018 1245   Hepatic Function Panel     Component Value Date/Time   PROT 7.0 06/09/2018 0903   ALBUMIN 4.3 06/09/2018 0903   AST 12 06/09/2018 0903   ALT 12 06/09/2018 0903   ALKPHOS 51 06/09/2018 0903   BILITOT 0.5 06/09/2018 0903   BILIDIR 0.1 12/31/2010 0933      Component Value Date/Time   TSH 2.04 06/09/2018 0903   TSH 1.68 06/03/2017 1044   TSH 1.57 05/29/2016 1036   Results for CHRISTEE, Ashlee Swanson (MRN [id number]) as of 11/07/2018 11:59  Ref. Range 09/07/2018 12:45  Vitamin D, 25-Hydroxy Latest Ref Range: 30.0 - 100.0 ng/mL 38.7   ASSESSMENT AND PLAN: Other constipation  Insulin resistance  Class 1 obesity with serious comorbidity and body mass index (BMI) of 31.0 to 31.9 in adult, unspecified obesity  type  PLAN:  Constipation Ashlee Swanson was informed decrease bowel movement frequency is normal while losing weight, but stools should not be hard or painful. She was advised to increase her H20 intake and work on increasing her fiber intake. High fiber foods were discussed today. [name] agrees to continue getting extra vegetables in her diet and to increase her water intake.  Insulin Resistance  Arpi will continue to work on weight loss, exercise, and decreasing simple carbohydrates in her diet to help decrease the risk of diabetes. She was informed that eating too many simple carbohydrates or too many calories at one sitting  increases the likelihood of GI side effects. Sarayah agrees to continue with her diet and has agreed to follow up with [place] as directed to monitor her progress.  I spent > than 50% of the 15 minute visit on counseling as documented in the note.  Obesity Edilia is currently in the action stage of change. As such, her goal is to continue with weight loss efforts. She has agreed to follow the Category 2 plan or the vegetarian plan. We discussed fruit options. Arlissa has been instructed to continue walking for 30 minutes. We discussed the following Behavioral Modification Strategies today: increasing lean protein intake, increase H2O intake, and planning for success.  Railyn has agreed to follow up with our clinic in 2 weeks. She was informed of the importance of frequent follow up visits to maximize her success with intensive lifestyle modifications for her multiple health conditions.   OBESITY BEHAVIORAL INTERVENTION VISIT  Today's visit was # 5   Starting weight: 194 lbs Starting date: 09/07/18 Today's weight : Weight: 185 lb (83.9 kg)  Today's date: 11/02/2018 Total lbs lost to date: 9  ASK: We discussed the diagnosis of obesity with Prudence Davidson today and Juliett agreed to give [place] permission to discuss obesity behavioral modification therapy  today.  ASSESS: Lien has the diagnosis of obesity and her BMI today is 31.74. Feleshia is in the action stage of change.   ADVISE: Jaedin was educated on the multiple health risks of obesity as well as the benefit of weight loss to improve her health. She was advised of the need for long term treatment and the importance of lifestyle modifications to improve her current health and to decrease her risk of future health problems.  AGREE: Multiple dietary modification options and treatment options were discussed and Natara agreed to follow the recommendations documented in the above note.  ARRANGE: Keatyn was educated on the importance of frequent visits to treat obesity as outlined per CMS and USPSTF guidelines and agreed to schedule her next follow up appointment today.  Lenward Chancellor, am acting as Location manager for Georgianne Fick, FNP.  I have reviewed the above documentation for accuracy and completeness, and I agree with the above.  - Naethan Bracewell, FNP-C.

## 2018-11-16 ENCOUNTER — Encounter (INDEPENDENT_AMBULATORY_CARE_PROVIDER_SITE_OTHER): Payer: Self-pay | Admitting: Family Medicine

## 2018-11-16 ENCOUNTER — Ambulatory Visit (INDEPENDENT_AMBULATORY_CARE_PROVIDER_SITE_OTHER): Payer: 59 | Admitting: Family Medicine

## 2018-11-16 VITALS — BP 118/76 | HR 72 | Temp 98.1°F | Ht 64.0 in | Wt 184.0 lb

## 2018-11-16 DIAGNOSIS — Z9189 Other specified personal risk factors, not elsewhere classified: Secondary | ICD-10-CM

## 2018-11-16 DIAGNOSIS — E669 Obesity, unspecified: Secondary | ICD-10-CM

## 2018-11-16 DIAGNOSIS — K5909 Other constipation: Secondary | ICD-10-CM | POA: Diagnosis not present

## 2018-11-16 DIAGNOSIS — E559 Vitamin D deficiency, unspecified: Secondary | ICD-10-CM

## 2018-11-16 DIAGNOSIS — Z6831 Body mass index (BMI) 31.0-31.9, adult: Secondary | ICD-10-CM

## 2018-11-16 MED ORDER — VITAMIN D (ERGOCALCIFEROL) 1.25 MG (50000 UNIT) PO CAPS: 50000.0000 [IU] | ORAL_CAPSULE | ORAL | 0 refills | Status: DC

## 2018-11-23 ENCOUNTER — Encounter (INDEPENDENT_AMBULATORY_CARE_PROVIDER_SITE_OTHER): Payer: Self-pay | Admitting: Family Medicine

## 2018-11-23 NOTE — Progress Notes (Signed)
Office: [phone]  /  Fax: [phone]   HPI:   Chief Complaint: OBESITY Ashlee Swanson is here to discuss her progress with her obesity treatment plan. She is on the Category 2 plan or the vegetarian plan and is following her eating plan approximately 60 % of the time. She states she is walking 30 to 45 minutes 1 time per week. Ashlee Swanson has been dealing with tempting foods at work over the holidays.  Her weight is 184 lb (83.5 kg) today and has had a weight loss of 1 pound over a period of 2 weeks since her last visit. She has lost 10 lbs since starting treatment with Ashlee Swanson.  Vitamin D deficiency Ashlee Swanson has a diagnosis of vitamin D deficiency. She is currently taking vit D. Her last vitamin D level was 38.7 on 09/07/18 and is not at goal. She denies nausea, vomiting, or muscle weakness.  At risk for osteopenia and osteoporosis Ashlee Swanson is at higher risk of osteopenia and osteoporosis due to vitamin D deficiency.   Constipation Ashlee Swanson notes that she is still having some constipation, worse since attempting weight loss.   ALLERGIES: Allergies  Allergen Reactions  . Minocycline Hives  . Codeine Nausea Only    Abdominal pain, shakes  . Tetracycline Hives    MEDICATIONS: Current Outpatient Medications on File Prior to Visit  Medication Sig Dispense Refill  . Eszopiclone 3 MG TABS Take 1 tablet (3 mg total) by mouth at bedtime as needed. Take immediately before bedtime 30 tablet 0  . Ibuprofen-diphenhydrAMINE Cit (ADVIL PM PO) Take by mouth.    . polyethylene glycol (MIRALAX / GLYCOLAX) packet Take 17 g by mouth daily.     No current facility-administered medications on file prior to visit.     PAST MEDICAL HISTORY: Past Medical History:  Diagnosis Date  . Arthritis    In the Back  . Back pain   . Bunion   . DDD (degenerative disc disease), lumbar   . High cholesterol   . IBS (irritable bowel syndrome)   . Insomnia   . Kidney stone   . Lactose intolerance   . Snoring     PAST  SURGICAL HISTORY: Past Surgical History:  Procedure Laterality Date  . BUNIONECTOMY Left 1999   modified mcbride bunionectony    SOCIAL HISTORY: Social History   Tobacco Use  . Smoking status: Former Smoker    Packs/day: 0.25    Years: 10.00    Pack years: 2.50    Last attempt to quit: 05/29/2004    Years since quitting: 14.4  . Smokeless tobacco: Never Used  Substance Use Topics  . Alcohol use: Yes    Alcohol/week: 0.0 standard drinks    Comment: socially  . Drug use: No    FAMILY HISTORY: Family History  Problem Relation Age of Onset  . Colon cancer Paternal Aunt   . Breast cancer Maternal Aunt   . Heart disease Unknown   . Stroke Maternal Grandmother   . Hypertension Mother   . Hypertension Father   . Hypertension Brother   . Stroke Paternal Grandfather     ROS: Review of Systems  Constitutional: Positive for weight loss.  Gastrointestinal: Positive for constipation. Negative for nausea and vomiting.  Musculoskeletal:       Negative for muscle weakness.    PHYSICAL EXAM: Blood pressure 118/76, pulse 72, temperature 98.1 F (36.7 C), temperature source Oral, height 5\' 4"  (1.626 m), weight 184 lb (83.5 kg), SpO2 97 %. Body mass index  is 31.58 kg/m. Physical Exam  Constitutional: She is oriented to person, place, and time. She appears well-developed and well-nourished.  Cardiovascular: Normal rate.  Pulmonary/Chest: Effort normal.  Musculoskeletal: Normal range of motion.  Neurological: She is oriented to person, place, and time.  Skin: Skin is warm and dry.  Psychiatric: She has a normal mood and affect. Her behavior is normal.  Vitals reviewed.   RECENT LABS AND TESTS: BMET    Component Value Date/Time   NA 139 06/09/2018 0903   K 4.2 06/09/2018 0903   CL 105 06/09/2018 0903   CO2 27 06/09/2018 0903   GLUCOSE 90 06/09/2018 0903   BUN 13 06/09/2018 0903   CREATININE 0.88 06/09/2018 0903   CALCIUM 9.5 06/09/2018 0903   GFRNONAA 74 12/31/2008  1110   GFRAA 90 12/31/2008 1110   Lab Results  Component Value Date   HGBA1C 5.2 09/07/2018   Lab Results  Component Value Date   INSULIN 6.8 09/07/2018   CBC    Component Value Date/Time   WBC 7.9 06/09/2018 0903   RBC 4.11 06/09/2018 0903   HGB 14.2 06/09/2018 0903   HCT 40.7 06/09/2018 0903   PLT 353.0 06/09/2018 0903   MCV 99.1 06/09/2018 0903   MCHC 34.9 06/09/2018 0903   RDW 12.3 06/09/2018 0903   LYMPHSABS 2.7 06/09/2018 0903   MONOABS 0.6 06/09/2018 0903   EOSABS 0.1 06/09/2018 0903   BASOSABS 0.1 06/09/2018 0903   Iron/TIBC/Ferritin/ %Sat No results found for: IRON, TIBC, FERRITIN, IRONPCTSAT Lipid Panel     Component Value Date/Time   CHOL 168 09/07/2018 1245   TRIG 81 09/07/2018 1245   HDL 51 09/07/2018 1245   CHOLHDL 4 06/09/2018 0903   VLDL 19.4 06/09/2018 0903   LDLCALC 101 (H) 09/07/2018 1245   Hepatic Function Panel     Component Value Date/Time   PROT 7.0 06/09/2018 0903   ALBUMIN 4.3 06/09/2018 0903   AST 12 06/09/2018 0903   ALT 12 06/09/2018 0903   ALKPHOS 51 06/09/2018 0903   BILITOT 0.5 06/09/2018 0903   BILIDIR 0.1 12/31/2010 0933      Component Value Date/Time   TSH 2.04 06/09/2018 0903   TSH 1.68 06/03/2017 1044   TSH 1.57 05/29/2016 1036   Results for SYNA, GAD (MRN [id number]) as of 11/23/2018 09:53  Ref. Range 09/07/2018 12:45  Vitamin D, 25-Hydroxy Latest Ref Range: 30.0 - 100.0 ng/mL 38.7   ASSESSMENT AND PLAN: Vitamin D deficiency - Plan: Vitamin D, Ergocalciferol, (DRISDOL) 1.25 MG (50000 UT) CAPS capsule  Other constipation  At risk for osteoporosis  Class 1 obesity with serious comorbidity and body mass index (BMI) of 31.0 to 31.9 in adult, unspecified obesity type  PLAN:  Vitamin D Deficiency Ashlee Swanson was informed that low vitamin D levels contributes to fatigue and are associated with obesity, breast, and colon cancer. She agrees to continue to take prescription Vit D @50 ,000 IU weekly #4 and will  follow up for routine testing of vitamin D, at least 2-3 times per year. She was informed of the risk of over-replacement of vitamin D and agrees to not increase her dose unless she discusses this with Ashlee Swanson first. Ashlee Swanson agrees to follow up in 2 weeks.  At risk for osteopenia and osteoporosis Ashlee Swanson was given extended (15 minutes) osteoporosis prevention counseling today. Caly is at risk for osteopenia and osteoporosis due to her vitamin D deficiency. She was encouraged to take her vitamin D and follow her higher calcium diet and  increase strengthening exercise to help strengthen her bones and decrease her risk of osteopenia and osteoporosis.  Constipation Breezy was informed decrease bowel movement frequency is normal while losing weight, but stools should not be hard or painful. She was advised to increase her H20 intake and work on increasing her fiber and fruit intake (using snack calories). High fiber foods were discussed today. She can take Miralax if needed.  Obesity Aaleeyah is currently in the action stage of change. As such, her goal is to continue with weight loss efforts. She has agreed to follow the Category 2 plan or the vegetarian plan. Shelisa has been instructed to continue walking. We discussed the following Behavioral Modification Strategies today: increase H2O intake, better snacking choices, holiday eating strategies, avoiding temptation, and planning for success.   Tylicia has agreed to follow up with our clinic in 2 weeks. She was informed of the importance of frequent follow up visits to maximize her success with intensive lifestyle modifications for her multiple health conditions.   OBESITY BEHAVIORAL INTERVENTION VISIT  Today's visit was # 6   Starting weight: 194 lbs Starting date: 09/07/18 Today's weight : Weight: 184 lb (83.5 kg)  Today's date: 11/16/2018 Total lbs lost to date: 10  ASK: We discussed the diagnosis of obesity with Prudence Davidson today and  Nerine agreed to give Ashlee Swanson permission to discuss obesity behavioral modification therapy today.  ASSESS: Briseidy has the diagnosis of obesity and her BMI today is 31.57. Jaycie is in the action stage of change.   ADVISE: Lequisha was educated on the multiple health risks of obesity as well as the benefit of weight loss to improve her health. She was advised of the need for long term treatment and the importance of lifestyle modifications to improve her current health and to decrease her risk of future health problems.  AGREE: Multiple dietary modification options and treatment options were discussed and Tsion agreed to follow the recommendations documented in the above note.  ARRANGE: Kaycie was educated on the importance of frequent visits to treat obesity as outlined per CMS and USPSTF guidelines and agreed to schedule her next follow up appointment today.  I, Marcille Blanco, am acting as Location manager for [employer], FNP-C.  I have reviewed the above documentation for accuracy and completeness, and I agree with the above.  - Dawn Whitmire, FNP-C.

## 2018-11-30 ENCOUNTER — Ambulatory Visit (INDEPENDENT_AMBULATORY_CARE_PROVIDER_SITE_OTHER): Payer: 59 | Admitting: Family Medicine

## 2018-12-08 ENCOUNTER — Encounter (INDEPENDENT_AMBULATORY_CARE_PROVIDER_SITE_OTHER): Payer: Self-pay | Admitting: Family Medicine

## 2018-12-08 ENCOUNTER — Ambulatory Visit (INDEPENDENT_AMBULATORY_CARE_PROVIDER_SITE_OTHER): Payer: 59 | Admitting: Family Medicine

## 2018-12-08 VITALS — BP 134/75 | HR 68 | Temp 98.4°F | Ht 64.0 in | Wt 185.0 lb

## 2018-12-08 DIAGNOSIS — Z9189 Other specified personal risk factors, not elsewhere classified: Secondary | ICD-10-CM | POA: Diagnosis not present

## 2018-12-08 DIAGNOSIS — E669 Obesity, unspecified: Secondary | ICD-10-CM

## 2018-12-08 DIAGNOSIS — Z6831 Body mass index (BMI) 31.0-31.9, adult: Secondary | ICD-10-CM

## 2018-12-08 DIAGNOSIS — K5909 Other constipation: Secondary | ICD-10-CM

## 2018-12-08 DIAGNOSIS — E559 Vitamin D deficiency, unspecified: Secondary | ICD-10-CM | POA: Diagnosis not present

## 2018-12-08 MED ORDER — VITAMIN D (ERGOCALCIFEROL) 1.25 MG (50000 UNIT) PO CAPS: 50000.0000 [IU] | ORAL_CAPSULE | ORAL | 0 refills | Status: DC

## 2018-12-12 ENCOUNTER — Encounter (INDEPENDENT_AMBULATORY_CARE_PROVIDER_SITE_OTHER): Payer: Self-pay | Admitting: Family Medicine

## 2018-12-12 DIAGNOSIS — E66811 Obesity, class 1: Secondary | ICD-10-CM | POA: Insufficient documentation

## 2018-12-12 DIAGNOSIS — Z6833 Body mass index (BMI) 33.0-33.9, adult: Secondary | ICD-10-CM | POA: Insufficient documentation

## 2018-12-12 DIAGNOSIS — E669 Obesity, unspecified: Secondary | ICD-10-CM | POA: Insufficient documentation

## 2018-12-12 DIAGNOSIS — E559 Vitamin D deficiency, unspecified: Secondary | ICD-10-CM | POA: Insufficient documentation

## 2018-12-12 DIAGNOSIS — Z6830 Body mass index (BMI) 30.0-30.9, adult: Secondary | ICD-10-CM

## 2018-12-12 NOTE — Progress Notes (Signed)
Office: [phone]  /  Fax: [phone]   HPI:   Chief Complaint: OBESITY Ashlee Swanson is here to discuss her progress with her obesity treatment plan. She is on the Category 2 plan or follow our protein rich vegetarian plan and is following her eating plan approximately 50% of the time. She states she is exercising 0 minutes 0 times per week. Ashlee Swanson notes work is very stressful and she is working many extra hours. There is a lot of extra food at work which she is indulging in. She plans on getting back on track next week.  Her weight is 185 lb (83.9 kg) today and has gained 1 pound since her last visit. She has lost 9 lbs since starting treatment with [place].  Vitamin D Deficiency Ashlee Swanson has a diagnosis of vitamin D deficiency. She is currently taking prescription Vit D, but level is not at goal. Last Vit D was 38.7 on 09/07/18. She denies nausea, vomiting or muscle weakness.  At risk for osteopenia and osteoporosis Ashlee Swanson is at higher risk of osteopenia and osteoporosis due to vitamin D deficiency.   Constipation Ashlee Swanson notes increased constipation. She has not taken miralax. She denies hematochezia or melena. She denies drinking less H20 recently.  ALLERGIES: Allergies  Allergen Reactions  . Minocycline Hives  . Codeine Nausea Only    Abdominal pain, shakes  . Tetracycline Hives    MEDICATIONS: Current Outpatient Medications on File Prior to Visit  Medication Sig Dispense Refill  . Eszopiclone 3 MG TABS Take 1 tablet (3 mg total) by mouth at bedtime as needed. Take immediately before bedtime 30 tablet 0  . Ibuprofen-diphenhydrAMINE Cit (ADVIL PM PO) Take by mouth.    . polyethylene glycol (MIRALAX / GLYCOLAX) packet Take 17 g by mouth daily.     No current facility-administered medications on file prior to visit.     PAST MEDICAL HISTORY: Past Medical History:  Diagnosis Date  . Arthritis    In the Back  . Back pain   . Bunion   . DDD (degenerative disc disease), lumbar     . High cholesterol   . IBS (irritable bowel syndrome)   . Insomnia   . Kidney stone   . Lactose intolerance   . Snoring     PAST SURGICAL HISTORY: Past Surgical History:  Procedure Laterality Date  . BUNIONECTOMY Left 1999   modified mcbride bunionectony    SOCIAL HISTORY: Social History   Tobacco Use  . Smoking status: Former Smoker    Packs/day: 0.25    Years: 10.00    Pack years: 2.50    Last attempt to quit: 05/29/2004    Years since quitting: 14.5  . Smokeless tobacco: Never Used  Substance Use Topics  . Alcohol use: Yes    Alcohol/week: 0.0 standard drinks    Comment: socially  . Drug use: No    FAMILY HISTORY: Family History  Problem Relation Age of Onset  . Colon cancer Paternal Aunt   . Breast cancer Maternal Aunt   . Heart disease Unknown   . Stroke Maternal Grandmother   . Hypertension Mother   . Hypertension Father   . Hypertension Brother   . Stroke Paternal Grandfather     ROS: Review of Systems  Constitutional: Negative for weight loss.  Gastrointestinal: Positive for constipation. Negative for melena, nausea and vomiting.       Negative hematochezia  Musculoskeletal:       Negative muscle weakness    PHYSICAL EXAM: Blood  pressure 134/75, pulse 68, temperature 98.4 F (36.9 C), temperature source Oral, height 5\' 4"  (1.626 m), weight 185 lb (83.9 kg), SpO2 98 %. Body mass index is 31.76 kg/m. Physical Exam Vitals signs reviewed.  Constitutional:      Appearance: Normal appearance. She is obese.  Cardiovascular:     Rate and Rhythm: Normal rate.     Pulses: Normal pulses.  Pulmonary:     Effort: Pulmonary effort is normal.  Musculoskeletal: Normal range of motion.  Skin:    General: Skin is warm and dry.  Neurological:     Mental Status: She is alert and oriented to person, place, and time.  Psychiatric:        Mood and Affect: Mood normal.        Behavior: Behavior normal.     RECENT LABS AND TESTS: BMET    Component  Value Date/Time   NA 139 06/09/2018 0903   K 4.2 06/09/2018 0903   CL 105 06/09/2018 0903   CO2 27 06/09/2018 0903   GLUCOSE 90 06/09/2018 0903   BUN 13 06/09/2018 0903   CREATININE 0.88 06/09/2018 0903   CALCIUM 9.5 06/09/2018 0903   GFRNONAA 74 12/31/2008 1110   GFRAA 90 12/31/2008 1110   Lab Results  Component Value Date   HGBA1C 5.2 09/07/2018   Lab Results  Component Value Date   INSULIN 6.8 09/07/2018   CBC    Component Value Date/Time   WBC 7.9 06/09/2018 0903   RBC 4.11 06/09/2018 0903   HGB 14.2 06/09/2018 0903   HCT 40.7 06/09/2018 0903   PLT 353.0 06/09/2018 0903   MCV 99.1 06/09/2018 0903   MCHC 34.9 06/09/2018 0903   RDW 12.3 06/09/2018 0903   LYMPHSABS 2.7 06/09/2018 0903   MONOABS 0.6 06/09/2018 0903   EOSABS 0.1 06/09/2018 0903   BASOSABS 0.1 06/09/2018 0903   Iron/TIBC/Ferritin/ %Sat No results found for: IRON, TIBC, FERRITIN, IRONPCTSAT Lipid Panel     Component Value Date/Time   CHOL 168 09/07/2018 1245   TRIG 81 09/07/2018 1245   HDL 51 09/07/2018 1245   CHOLHDL 4 06/09/2018 0903   VLDL 19.4 06/09/2018 0903   LDLCALC 101 (H) 09/07/2018 1245   Hepatic Function Panel     Component Value Date/Time   PROT 7.0 06/09/2018 0903   ALBUMIN 4.3 06/09/2018 0903   AST 12 06/09/2018 0903   ALT 12 06/09/2018 0903   ALKPHOS 51 06/09/2018 0903   BILITOT 0.5 06/09/2018 0903   BILIDIR 0.1 12/31/2010 0933      Component Value Date/Time   TSH 2.04 06/09/2018 0903   TSH 1.68 06/03/2017 1044   TSH 1.57 05/29/2016 1036    ASSESSMENT AND PLAN: Vitamin D deficiency - Plan: Vitamin D, Ergocalciferol, (DRISDOL) 1.25 MG (50000 UT) CAPS capsule  Other constipation  At risk for osteoporosis  Class 1 obesity with serious comorbidity and body mass index (BMI) of 31.0 to 31.9 in adult, unspecified obesity type  PLAN:  Vitamin D Deficiency Ashlee Swanson was informed that low vitamin D levels contributes to fatigue and are associated with obesity, breast, and  colon cancer. Ashlee Swanson agrees to continue taking prescription Vit D @50 ,000 IU every week #4 and we will refill for 1 month. She will follow up for routine testing of vitamin D, at least 2-3 times per year. She was informed of the risk of over-replacement of vitamin D and agrees to not increase her dose unless she discusses this with [place] first. Ashlee Swanson agrees to follow  up with our clinic in 3 weeks.  At risk for osteopenia and osteoporosis Ashlee Swanson was given extended (15 minutes) osteoporosis prevention counseling today. Ashlee Swanson is at risk for osteopenia and osteoporsis due to her vitamin D deficiency. She was encouraged to take her vitamin D and follow her higher calcium diet and increase strengthening exercise to help strengthen her bones and decrease her risk of osteopenia and osteoporosis.  Constipation Ashlee Swanson was informed decrease bowel movement frequency is normal while losing weight, but stools should not be hard or painful. She was advised to increase her vegetables, H20 intake and work on increasing her fiber intake. High fiber foods were discussed today. She was advised that she may have extra non-starchy vegetables at lunch.  Obesity Ashlee Swanson is currently in the action stage of change. As such, her goal is to continue with weight loss efforts She has agreed to follow the Category 2 plan Ashlee Swanson has not been prescribed exercise at this time. We discussed the following Behavioral Modification Strategies today: better snacking choices, celebration eating strategies, and planning for success    Ashlee Swanson has agreed to follow up with our clinic in 3 weeks. She was informed of the importance of frequent follow up visits to maximize her success with intensive lifestyle modifications for her multiple health conditions.   OBESITY BEHAVIORAL INTERVENTION VISIT  Today's visit was # 7  Starting weight: 194 lbs Starting date: 09/07/18 Today's weight : 185 lbs Today's date: 12/08/2018 Total lbs lost to  date: 9    ASK: We discussed the diagnosis of obesity with Ashlee Swanson today and Ashlee Swanson agreed to give [place] permission to discuss obesity behavioral modification therapy today.  ASSESS: Ashlee Swanson has the diagnosis of obesity and her BMI today is 31.74 Ashlee Swanson is in the action stage of change   ADVISE: Ashlee Swanson was educated on the multiple health risks of obesity as well as the benefit of weight loss to improve her health. She was advised of the need for long term treatment and the importance of lifestyle modifications to improve her current health and to decrease her risk of future health problems.  AGREE: Multiple dietary modification options and treatment options were discussed and  Ashlee Swanson agreed to follow the recommendations documented in the above note.  ARRANGE: Ashlee Swanson was educated on the importance of frequent visits to treat obesity as outlined per CMS and USPSTF guidelines and agreed to schedule her next follow up appointment today.  Ashlee Swanson, am acting as Location manager for [employer], FNP-C.  I have reviewed the above documentation for accuracy and completeness, and I agree with the above.  - Sarinity Dicicco, FNP-C.

## 2018-12-26 ENCOUNTER — Other Ambulatory Visit (INDEPENDENT_AMBULATORY_CARE_PROVIDER_SITE_OTHER): Payer: Self-pay | Admitting: Family Medicine

## 2018-12-26 DIAGNOSIS — E559 Vitamin D deficiency, unspecified: Secondary | ICD-10-CM

## 2018-12-28 ENCOUNTER — Encounter (INDEPENDENT_AMBULATORY_CARE_PROVIDER_SITE_OTHER): Payer: Self-pay | Admitting: Family Medicine

## 2018-12-28 ENCOUNTER — Ambulatory Visit (INDEPENDENT_AMBULATORY_CARE_PROVIDER_SITE_OTHER): Payer: 59 | Admitting: Family Medicine

## 2018-12-28 VITALS — BP 132/84 | HR 68 | Temp 98.6°F | Ht 64.0 in | Wt 188.0 lb

## 2018-12-28 DIAGNOSIS — E559 Vitamin D deficiency, unspecified: Secondary | ICD-10-CM

## 2018-12-28 DIAGNOSIS — Z9189 Other specified personal risk factors, not elsewhere classified: Secondary | ICD-10-CM

## 2018-12-28 DIAGNOSIS — E7849 Other hyperlipidemia: Secondary | ICD-10-CM

## 2018-12-28 DIAGNOSIS — E669 Obesity, unspecified: Secondary | ICD-10-CM

## 2018-12-28 DIAGNOSIS — Z6832 Body mass index (BMI) 32.0-32.9, adult: Secondary | ICD-10-CM

## 2018-12-28 MED ORDER — VITAMIN D (ERGOCALCIFEROL) 1.25 MG (50000 UNIT) PO CAPS: 50000.0000 [IU] | ORAL_CAPSULE | ORAL | 0 refills | Status: DC

## 2018-12-29 ENCOUNTER — Encounter (INDEPENDENT_AMBULATORY_CARE_PROVIDER_SITE_OTHER): Payer: Self-pay | Admitting: Family Medicine

## 2018-12-29 DIAGNOSIS — E7849 Other hyperlipidemia: Secondary | ICD-10-CM | POA: Insufficient documentation

## 2018-12-29 NOTE — Progress Notes (Signed)
Office: [phone]  /  Fax: [phone]   HPI:   Chief Complaint: OBESITY Ashlee Swanson is here to discuss her progress with her obesity treatment plan. She is on the Category 2 plan and is following her eating plan approximately 60 % of the time. She states she is walking 30 minutes 2 times per week. Ashlee Swanson was off her plan over the holidays. She is very disappointed in her weight gain. She reports boredom eating at night.  Her weight is 188 lb (85.3 kg) today and has had a weight gain of 3 pounds over a period of 3 weeks since her last visit. She has lost 6 lbs since starting treatment with [place].  Vitamin D deficiency Ashlee Swanson has a diagnosis of vitamin D deficiency. She is currently taking vit D and is not at goal. Her last vitamin D level was 38.7 on 09/07/18. She denies nausea, vomiting, or muscle weakness.  Hyperlipidemia Ashlee Swanson has hyperlipidemia and has been trying to improve her cholesterol levels with intensive lifestyle modification including a low saturated fat diet, exercise and weight loss. She is not on a statin and denies any chest pain or shortness of breath.  At risk for cardiovascular disease Ashlee Swanson is at a higher than average risk for cardiovascular disease due to hyperlipidemia and obesity. She currently denies any chest pain.  ASSESSMENT AND PLAN:  Vitamin D deficiency - Plan: Vitamin D, Ergocalciferol, (DRISDOL) 1.25 MG (50000 UT) CAPS capsule  Other hyperlipidemia  At risk for heart disease  Class 1 obesity with serious comorbidity and body mass index (BMI) of 32.0 to 32.9 in adult, unspecified obesity type  PLAN:  Vitamin D Deficiency Ashlee Swanson was informed that low vitamin D levels contributes to fatigue and are associated with obesity, breast, and colon cancer. She agrees to continue to take prescription Vit D @50 ,000 IU every week #4 with no refills and will follow up for routine testing of vitamin D, at least 2-3 times per year. She was informed of the risk of  over-replacement of vitamin D and agrees to not increase her dose unless she discusses this with [place] first. We will check her vitamin D level at her next visit. Ashlee Swanson agrees to follow up in 2 weeks.  Hyperlipidemia Ashlee Swanson was informed of the American Heart Association Guidelines emphasizing intensive lifestyle modifications as the first line treatment for hyperlipidemia. We discussed many lifestyle modifications today in depth, and Ashlee Swanson will continue to work on decreasing saturated fats such as fatty red meat, butter and many fried foods. She will also increase vegetables and lean protein in her diet and continue to work on exercise and weight loss efforts. We will order a FLP at her next visit and she agrees to follow up in 2 weeks.  Cardiovascular risk counseling Ashlee Swanson was given extended (15 minutes) coronary artery disease prevention counseling today. She is 48 y.o. female and has risk factors for heart disease including hyperlipidemia and obesity. We discussed intensive lifestyle modifications today with an emphasis on specific weight loss instructions and strategies. Pt was also informed of the importance of increasing exercise and decreasing saturated fats to help prevent heart disease.  Obesity Ashlee Swanson is currently in the action stage of change. As such, her goal is to continue with weight loss efforts. She has agreed to follow the Category 2 plan and she may keep a food journal with 400 to 500 calories and 35+ grams of protein for supper. She was given handouts on Ashlee Swanson, Journaling, and Protein Content. Ashlee Swanson  has been instructed to walk 2 times per week for 30 minutes. We discussed the following Behavioral Modification Strategies today: work on meal planning and easy cooking plans, planning for success, and keeping a strict food journal.  Ashlee Swanson has agreed to follow up with our clinic in 2 weeks. She was informed of the importance of frequent follow up visits to maximize her success  with intensive lifestyle modifications for her multiple health conditions.  ALLERGIES: Allergies  Allergen Reactions  . Minocycline Hives  . Codeine Nausea Only    Abdominal pain, shakes  . Tetracycline Hives    MEDICATIONS: Current Outpatient Medications on File Prior to Visit  Medication Sig Dispense Refill  . Eszopiclone 3 MG TABS Take 1 tablet (3 mg total) by mouth at bedtime as needed. Take immediately before bedtime 30 tablet 0  . Ibuprofen-diphenhydrAMINE Cit (ADVIL PM PO) Take by mouth.    . polyethylene glycol (MIRALAX / GLYCOLAX) packet Take 17 g by mouth daily.     No current facility-administered medications on file prior to visit.     PAST MEDICAL HISTORY: Past Medical History:  Diagnosis Date  . Arthritis    In the Back  . Back pain   . Bunion   . DDD (degenerative disc disease), lumbar   . High cholesterol   . IBS (irritable bowel syndrome)   . Insomnia   . Kidney stone   . Lactose intolerance   . Snoring     PAST SURGICAL HISTORY: Past Surgical History:  Procedure Laterality Date  . BUNIONECTOMY Left 1999   modified mcbride bunionectony    SOCIAL HISTORY: Social History   Tobacco Use  . Smoking status: Former Smoker    Packs/day: 0.25    Years: 10.00    Pack years: 2.50    Last attempt to quit: 05/29/2004    Years since quitting: 14.5  . Smokeless tobacco: Never Used  Substance Use Topics  . Alcohol use: Yes    Alcohol/week: 0.0 standard drinks    Comment: socially  . Drug use: No    FAMILY HISTORY: Family History  Problem Relation Age of Onset  . Colon cancer Paternal Aunt   . Breast cancer Maternal Aunt   . Heart disease Unknown   . Stroke Maternal Grandmother   . Hypertension Mother   . Hypertension Father   . Hypertension Brother   . Stroke Paternal Grandfather     ROS: Review of Systems  Constitutional: Negative for weight loss.  Respiratory: Negative for shortness of breath.   Cardiovascular: Negative for chest pain.    Gastrointestinal: Negative for nausea and vomiting.  Musculoskeletal:       Negative for muscle weakness.    PHYSICAL EXAM: Blood pressure 132/84, pulse 68, temperature 98.6 F (37 C), temperature source Oral, height 5\' 4"  (1.626 m), weight 188 lb (85.3 kg), SpO2 98 %. Body mass index is 32.27 kg/m. Physical Exam Vitals signs reviewed.  Constitutional:      Appearance: Normal appearance. She is obese.  Cardiovascular:     Rate and Rhythm: Normal rate.  Pulmonary:     Effort: Pulmonary effort is normal.  Musculoskeletal: Normal range of motion.  Skin:    General: Skin is warm and dry.  Neurological:     Mental Status: She is alert and oriented to person, place, and time.  Psychiatric:     Comments: Tearful during the visit over weight gain.     RECENT LABS AND TESTS: BMET    Component  Value Date/Time   NA 139 06/09/2018 0903   K 4.2 06/09/2018 0903   CL 105 06/09/2018 0903   CO2 27 06/09/2018 0903   GLUCOSE 90 06/09/2018 0903   BUN 13 06/09/2018 0903   CREATININE 0.88 06/09/2018 0903   CALCIUM 9.5 06/09/2018 0903   GFRNONAA 74 12/31/2008 1110   GFRAA 90 12/31/2008 1110   Lab Results  Component Value Date   HGBA1C 5.2 09/07/2018   Lab Results  Component Value Date   INSULIN 6.8 09/07/2018   CBC    Component Value Date/Time   WBC 7.9 06/09/2018 0903   RBC 4.11 06/09/2018 0903   HGB 14.2 06/09/2018 0903   HCT 40.7 06/09/2018 0903   PLT 353.0 06/09/2018 0903   MCV 99.1 06/09/2018 0903   MCHC 34.9 06/09/2018 0903   RDW 12.3 06/09/2018 0903   LYMPHSABS 2.7 06/09/2018 0903   MONOABS 0.6 06/09/2018 0903   EOSABS 0.1 06/09/2018 0903   BASOSABS 0.1 06/09/2018 0903   Iron/TIBC/Ferritin/ %Sat No results found for: IRON, TIBC, FERRITIN, IRONPCTSAT Lipid Panel     Component Value Date/Time   CHOL 168 09/07/2018 1245   TRIG 81 09/07/2018 1245   HDL 51 09/07/2018 1245   CHOLHDL 4 06/09/2018 0903   VLDL 19.4 06/09/2018 0903   LDLCALC 101 (H) 09/07/2018  1245   Hepatic Function Panel     Component Value Date/Time   PROT 7.0 06/09/2018 0903   ALBUMIN 4.3 06/09/2018 0903   AST 12 06/09/2018 0903   ALT 12 06/09/2018 0903   ALKPHOS 51 06/09/2018 0903   BILITOT 0.5 06/09/2018 0903   BILIDIR 0.1 12/31/2010 0933      Component Value Date/Time   TSH 2.04 06/09/2018 0903   TSH 1.68 06/03/2017 1044   TSH 1.57 05/29/2016 1036   Results for ALLAN, BACIGALUPI (MRN [id number]) as of 12/29/2018 10:39  Ref. Range 09/07/2018 12:45  Vitamin D, 25-Hydroxy Latest Ref Range: 30.0 - 100.0 ng/mL 38.7    OBESITY BEHAVIORAL INTERVENTION VISIT  Today's visit was # 8   Starting weight: 194 lbs Starting date: 09/07/18 Today's weight : Weight: 188 lb (85.3 kg)  Today's date: 12/28/2018 Total lbs lost to date: 6  ASK: We discussed the diagnosis of obesity with Prudence Davidson today and Semaj agreed to give [place] permission to discuss obesity behavioral modification therapy today.  ASSESS: Emaline has the diagnosis of obesity and her BMI today is 32.2. Manon is in the action stage of change.   ADVISE: Marquis was educated on the multiple health risks of obesity as well as the benefit of weight loss to improve her health. She was advised of the need for long term treatment and the importance of lifestyle modifications to improve her current health and to decrease her risk of future health problems.  AGREE: Multiple dietary modification options and treatment options were discussed and Nakkia agreed to follow the recommendations documented in the above note.  ARRANGE: Bess was educated on the importance of frequent visits to treat obesity as outlined per CMS and USPSTF guidelines and agreed to schedule her next follow up appointment today.  I, Marcille Blanco, am acting as Location manager for [employer], FNP-C.  I have reviewed the above documentation for accuracy and completeness, and I agree with the above.  - Genessis Flanary,  FNP-C.

## 2019-01-16 ENCOUNTER — Ambulatory Visit (INDEPENDENT_AMBULATORY_CARE_PROVIDER_SITE_OTHER): Payer: 59 | Admitting: Family Medicine

## 2019-01-16 ENCOUNTER — Encounter (INDEPENDENT_AMBULATORY_CARE_PROVIDER_SITE_OTHER): Payer: Self-pay | Admitting: Family Medicine

## 2019-01-16 VITALS — BP 132/79 | HR 66 | Temp 98.0°F | Ht 64.0 in | Wt 182.0 lb

## 2019-01-16 DIAGNOSIS — E559 Vitamin D deficiency, unspecified: Secondary | ICD-10-CM | POA: Diagnosis not present

## 2019-01-16 DIAGNOSIS — E7849 Other hyperlipidemia: Secondary | ICD-10-CM | POA: Diagnosis not present

## 2019-01-16 DIAGNOSIS — Z6831 Body mass index (BMI) 31.0-31.9, adult: Secondary | ICD-10-CM

## 2019-01-16 DIAGNOSIS — E669 Obesity, unspecified: Secondary | ICD-10-CM | POA: Diagnosis not present

## 2019-01-16 DIAGNOSIS — Z9189 Other specified personal risk factors, not elsewhere classified: Secondary | ICD-10-CM | POA: Diagnosis not present

## 2019-01-16 MED ORDER — VITAMIN D (ERGOCALCIFEROL) 1.25 MG (50000 UNIT) PO CAPS: 50000.0000 [IU] | ORAL_CAPSULE | ORAL | 0 refills | Status: DC

## 2019-01-16 NOTE — Progress Notes (Signed)
Office: [phone]  /  Fax: [phone]   HPI:   Chief Complaint: OBESITY Ashlee Swanson is here to discuss her progress with her obesity treatment plan. She is keeping a food journal with 400 to 500 calories and 35 grams of protein for supper and following the Category 2 plan and is following her eating plan approximately 90 % of the time. She states she is doing yoga and walking 60 minutes 2 times per week. Ashlee Swanson stuck to the plan quite well over the past 2 weeks. She is very happy with her weight loss today. She denies polyphagia.  Her weight is 182 lb (82.6 kg) today and has had a weight loss of 6 pounds over a period of 3 weeks since her last visit. She has lost 12 lbs since starting treatment with [place].  Vitamin D deficiency Ashlee Swanson has a diagnosis of vitamin D deficiency. She is currently taking vit D and is not at goal. Her last vitamin D level was 38.7 on 09/07/18. She denies nausea, vomiting, or muscle weakness.  At risk for osteopenia and osteoporosis Ashlee Swanson is at higher risk of osteopenia and osteoporosis due to vitamin D deficiency.   Hyperlipidemia Ashlee Swanson has hyperlipidemia and has been trying to improve her cholesterol levels with intensive lifestyle modification including a low saturated fat diet, exercise and weight loss. She is not on a statin and her last LDL was 101 on 09/07/18. She denies any chest pain or shortness of breath..  ASSESSMENT AND PLAN:  Vitamin D deficiency - Plan: VITAMIN D 25 Hydroxy (Vit-D Deficiency, Fractures), Vitamin D, Ergocalciferol, (DRISDOL) 1.25 MG (50000 UT) CAPS capsule  Other hyperlipidemia - Plan: Lipid Panel With LDL/HDL Ratio  At risk for osteoporosis  Class 1 obesity with serious comorbidity and body mass index (BMI) of 31.0 to 31.9 in adult, unspecified obesity type  PLAN:  Vitamin D Deficiency Ashlee Swanson was informed that low vitamin D levels contributes to fatigue and are associated with obesity, breast, and colon cancer. She agrees to  continue to take prescription Vit D @50 ,000 IU every week #4 with no refills and will follow up for routine testing of vitamin D, at least 2-3 times per year. She was informed of the risk of over-replacement of vitamin D and agrees to not increase her dose unless she discusses this with [place] first. We ordered a vitamin D level today and Ashlee Swanson agrees to follow up in 2 weeks.  At risk for osteopenia and osteoporosis Ashlee Swanson was given extended (15 minutes) osteoporosis prevention counseling today. Ashlee Swanson is at risk for osteopenia and osteoporosis due to her vitamin D deficiency. She was encouraged to take her vitamin D and follow her higher calcium diet and increase strengthening exercise to help strengthen her bones and decrease her risk of osteopenia and osteoporosis.  Hyperlipidemia Ashlee Swanson was informed of the American Heart Association Guidelines emphasizing intensive lifestyle modifications as the first line treatment for hyperlipidemia. We discussed many lifestyle modifications today in depth, and Ashlee Swanson will continue to work on decreasing saturated fats such as fatty red meat, butter and many fried foods. She will also increase vegetables and lean protein in her diet and continue to work on exercise and weight loss efforts. We will obtain a FLP today and she will follow up as directed.  Obesity Ashlee Swanson is currently in the action stage of change. As such, her goal is to continue with weight loss efforts. She has agreed to keep a food journal with 400 to 500 calories and 35 grams  of protein for supper and to follow the Category 2 plan. Ashlee Swanson has been instructed to continue yoga and walking 2 times per week.  We discussed the following Behavioral Modification Strategies today: better snacking choices and planning for success.  Ashlee Swanson has agreed to follow up with our clinic in 2 weeks. She was informed of the importance of frequent follow up visits to maximize her success with intensive lifestyle  modifications for her multiple health conditions.  ALLERGIES: Allergies  Allergen Reactions  . Minocycline Hives  . Codeine Nausea Only    Abdominal pain, shakes  . Tetracycline Hives    MEDICATIONS: Current Outpatient Medications on File Prior to Visit  Medication Sig Dispense Refill  . Eszopiclone 3 MG TABS Take 1 tablet (3 mg total) by mouth at bedtime as needed. Take immediately before bedtime 30 tablet 0  . Ibuprofen-diphenhydrAMINE Cit (ADVIL PM PO) Take by mouth.    . polyethylene glycol (MIRALAX / GLYCOLAX) packet Take 17 g by mouth daily.     No current facility-administered medications on file prior to visit.     PAST MEDICAL HISTORY: Past Medical History:  Diagnosis Date  . Arthritis    In the Back  . Back pain   . Bunion   . DDD (degenerative disc disease), lumbar   . High cholesterol   . IBS (irritable bowel syndrome)   . Insomnia   . Kidney stone   . Lactose intolerance   . Snoring     PAST SURGICAL HISTORY: Past Surgical History:  Procedure Laterality Date  . BUNIONECTOMY Left 1999   modified mcbride bunionectony    SOCIAL HISTORY: Social History   Tobacco Use  . Smoking status: Former Smoker    Packs/day: 0.25    Years: 10.00    Pack years: 2.50    Last attempt to quit: 05/29/2004    Years since quitting: 14.6  . Smokeless tobacco: Never Used  Substance Use Topics  . Alcohol use: Yes    Alcohol/week: 0.0 standard drinks    Comment: socially  . Drug use: No    FAMILY HISTORY: Family History  Problem Relation Age of Onset  . Colon cancer Paternal Aunt   . Breast cancer Maternal Aunt   . Heart disease Unknown   . Stroke Maternal Grandmother   . Hypertension Mother   . Hypertension Father   . Hypertension Brother   . Stroke Paternal Grandfather    ROS: Review of Systems  Constitutional: Positive for weight loss.  Respiratory: Negative for shortness of breath.   Cardiovascular: Negative for chest pain.  Gastrointestinal: Negative  for nausea and vomiting.  Musculoskeletal:       Negative for muscle weakness.  Endo/Heme/Allergies:       Negative for polyphagia.   PHYSICAL EXAM: Blood pressure 132/79, pulse 66, temperature 98 F (36.7 C), temperature source Oral, height 5\' 4"  (1.626 m), weight 182 lb (82.6 kg), SpO2 96 %. Body mass index is 31.24 kg/m. Physical Exam Vitals signs reviewed.  Constitutional:      Appearance: Normal appearance. She is obese.  Cardiovascular:     Rate and Rhythm: Normal rate.  Pulmonary:     Effort: Pulmonary effort is normal.  Musculoskeletal: Normal range of motion.  Skin:    General: Skin is warm and dry.  Neurological:     Mental Status: She is alert and oriented to person, place, and time.  Psychiatric:        Mood and Affect: Mood normal.  Behavior: Behavior normal.    RECENT LABS AND TESTS: BMET    Component Value Date/Time   NA 139 06/09/2018 0903   K 4.2 06/09/2018 0903   CL 105 06/09/2018 0903   CO2 27 06/09/2018 0903   GLUCOSE 90 06/09/2018 0903   BUN 13 06/09/2018 0903   CREATININE 0.88 06/09/2018 0903   CALCIUM 9.5 06/09/2018 0903   GFRNONAA 74 12/31/2008 1110   GFRAA 90 12/31/2008 1110   Lab Results  Component Value Date   HGBA1C 5.2 09/07/2018   Lab Results  Component Value Date   INSULIN 6.8 09/07/2018   CBC    Component Value Date/Time   WBC 7.9 06/09/2018 0903   RBC 4.11 06/09/2018 0903   HGB 14.2 06/09/2018 0903   HCT 40.7 06/09/2018 0903   PLT 353.0 06/09/2018 0903   MCV 99.1 06/09/2018 0903   MCHC 34.9 06/09/2018 0903   RDW 12.3 06/09/2018 0903   LYMPHSABS 2.7 06/09/2018 0903   MONOABS 0.6 06/09/2018 0903   EOSABS 0.1 06/09/2018 0903   BASOSABS 0.1 06/09/2018 0903   Iron/TIBC/Ferritin/ %Sat No results found for: IRON, TIBC, FERRITIN, IRONPCTSAT Lipid Panel     Component Value Date/Time   CHOL 168 09/07/2018 1245   TRIG 81 09/07/2018 1245   HDL 51 09/07/2018 1245   CHOLHDL 4 06/09/2018 0903   VLDL 19.4 06/09/2018  0903   LDLCALC 101 (H) 09/07/2018 1245   Hepatic Function Panel     Component Value Date/Time   PROT 7.0 06/09/2018 0903   ALBUMIN 4.3 06/09/2018 0903   AST 12 06/09/2018 0903   ALT 12 06/09/2018 0903   ALKPHOS 51 06/09/2018 0903   BILITOT 0.5 06/09/2018 0903   BILIDIR 0.1 12/31/2010 0933      Component Value Date/Time   TSH 2.04 06/09/2018 0903   TSH 1.68 06/03/2017 1044   TSH 1.57 05/29/2016 1036   Results for [name], RIERA (MRN [id number]) as of 01/16/2019 15:00  Ref. Range 09/07/2018 12:45  Vitamin D, 25-Hydroxy Latest Ref Range: 30.0 - 100.0 ng/mL 38.7    OBESITY BEHAVIORAL INTERVENTION VISIT  Today's visit was # 9   Starting weight: 194 lbs Starting date: 09/07/18 Today's weight : Weight: 182 lb (82.6 kg)  Today's date: 01/16/2019 Total lbs lost to date: 12  ASK: We discussed the diagnosis of obesity with Ashlee Swanson today and Jamae agreed to give [place] permission to discuss obesity behavioral modification therapy today.  ASSESS: Ashlee Swanson has the diagnosis of obesity and her BMI today is 31.2. Ashlee Swanson is in the action stage of change.   ADVISE: Ashlee Swanson was educated on the multiple health risks of obesity as well as the benefit of weight loss to improve her health. She was advised of the need for long term treatment and the importance of lifestyle modifications to improve her current health and to decrease her risk of future health problems.  AGREE: Multiple dietary modification options and treatment options were discussed and Ashlee Swanson agreed to follow the recommendations documented in the above note.  ARRANGE: Ashlee Swanson was educated on the importance of frequent visits to treat obesity as outlined per CMS and USPSTF guidelines and agreed to schedule her next follow up appointment today.  I, Marcille Blanco, am acting as Location manager for [employer], FNP-C.  I have reviewed the above documentation for accuracy and completeness, and I agree with the  above.  - Ashlee Nary, FNP-C.

## 2019-01-17 LAB — LIPID PANEL WITH LDL/HDL RATIO
Cholesterol, Total: 186 mg/dL (ref 100–199)
HDL: 49 mg/dL (ref >39)
LDL Calculated: 122 mg/dL — ABNORMAL HIGH (ref 0–99)
LDl/HDL Ratio: 2.5 ratio (ref 0.0–3.2)
Triglycerides: 74 mg/dL (ref 0–149)
VLDL Cholesterol Cal: 15 mg/dL (ref 5–40)

## 2019-01-17 LAB — VITAMIN D 25 HYDROXY (VIT D DEFICIENCY, FRACTURES): Vit D, 25-Hydroxy: 45.2 ng/mL (ref 30.0–100.0)

## 2019-01-24 ENCOUNTER — Other Ambulatory Visit (INDEPENDENT_AMBULATORY_CARE_PROVIDER_SITE_OTHER): Payer: Self-pay | Admitting: Family Medicine

## 2019-01-24 DIAGNOSIS — E559 Vitamin D deficiency, unspecified: Secondary | ICD-10-CM

## 2019-02-01 ENCOUNTER — Encounter (INDEPENDENT_AMBULATORY_CARE_PROVIDER_SITE_OTHER): Payer: Self-pay | Admitting: Family Medicine

## 2019-02-01 ENCOUNTER — Ambulatory Visit (INDEPENDENT_AMBULATORY_CARE_PROVIDER_SITE_OTHER): Payer: 59 | Admitting: Family Medicine

## 2019-02-01 VITALS — BP 117/87 | HR 75 | Temp 98.0°F | Ht 64.0 in | Wt 182.0 lb

## 2019-02-01 DIAGNOSIS — E7849 Other hyperlipidemia: Secondary | ICD-10-CM | POA: Diagnosis not present

## 2019-02-01 DIAGNOSIS — E559 Vitamin D deficiency, unspecified: Secondary | ICD-10-CM | POA: Diagnosis not present

## 2019-02-01 DIAGNOSIS — Z9189 Other specified personal risk factors, not elsewhere classified: Secondary | ICD-10-CM

## 2019-02-01 DIAGNOSIS — E669 Obesity, unspecified: Secondary | ICD-10-CM

## 2019-02-01 DIAGNOSIS — Z6831 Body mass index (BMI) 31.0-31.9, adult: Secondary | ICD-10-CM

## 2019-02-01 MED ORDER — VITAMIN D (ERGOCALCIFEROL) 1.25 MG (50000 UNIT) PO CAPS: 50000.0000 [IU] | ORAL_CAPSULE | ORAL | 0 refills | Status: DC

## 2019-02-01 NOTE — Progress Notes (Signed)
Office: [phone]  /  Fax: [phone]   HPI:   Chief Complaint: OBESITY Ashlee Swanson is here to discuss her progress with her obesity treatment plan. She is on the keep a food journal with 400 to 500 calories and 35 grams of protein for supper and to follow the Category 2 plan.  and is following her eating plan approximately 75 % of the time. She states she is doing yoga 60 minutes 1 time per week and walking 30 minutes 1 times per week.  Ashlee Swanson has hunger after dinner. She is not always eating all of her protein for dinner. Her weight is 182 lb (82.6 kg) today and has lost 0 lbs since her last visit. She has lost 12 lbs since starting treatment with [place].  Vitamin D deficiency Ashlee Swanson has a diagnosis of vitamin D deficiency. She is currently taking prescription Vit D and denies nausea, vomiting or muscle weakness. She is not at goal but Vit D level has improved. Her last level was 45.2.  Hyperlipidemia Ashlee Swanson has hyperlipidemia and has been trying to improve her cholesterol levels with intensive lifestyle modification including a low saturated fat diet, exercise and weight loss. She denies any chest pain. Her ASCVD score is 0.9%. She is not on a statin. The 10-year ASCVD risk score Ashlee Bussing DC [name], et al., 2013) is: 0.9%   Values used to calculate the score:     Age: 48 years     Sex: Female     Is Non-Hispanic African American: No     Diabetic: No     Tobacco smoker: No     Systolic Blood Pressure: 101 mmHg     Is BP treated: No     HDL Cholesterol: 49 mg/dL     Total Cholesterol: 186 mg/dL  At risk for osteopenia and osteoporosis Ashlee Swanson is at higher risk of osteopenia and osteoporosis due to vitamin D deficiency.   ASSESSMENT AND PLAN:  Vitamin D deficiency - Plan: Vitamin D, Ergocalciferol, (DRISDOL) 1.25 MG (50000 UT) CAPS capsule  Other hyperlipidemia  At risk for osteoporosis  Class 1 obesity with serious comorbidity and body mass index (BMI) of 31.0 to 31.9 in adult,  unspecified obesity type  PLAN:  Vitamin D Deficiency Alantis was informed that low vitamin D levels contributes to fatigue and are associated with obesity, breast, and colon cancer. She agrees to continue to take prescription Vit D 50,000 IU every week #4 with no refills and will follow up for routine testing of vitamin D, at least 2-3 times per year. She was informed of the risk of over-replacement of vitamin D and agrees to not increase her dose unless she discusses this with [place] first. Shaqueena agrees to follow up with our clinic in 3 weeks.  Hyperlipidemia Ashlee Swanson was informed of the American Heart Association Guidelines emphasizing intensive lifestyle modifications as the first line treatment for hyperlipidemia. We discussed many lifestyle modifications today in depth, and Maebry will continue to work on decreasing saturated fats such as fatty red meat, butter and many fried foods. She will also increase vegetables and lean protein in her diet and continue to work on exercise and weight loss efforts. Ashlee Swanson agrees to continue with meal plan and follow up with our clinic in 3 weeks.  At risk for osteopenia and osteoporosis Ashlee Swanson was given extended  (15 minutes) osteoporosis prevention counseling today. Ashlee Swanson is at risk for osteopenia and osteoporosis due to her vitamin D deficiency. She was encouraged to take her  vitamin D and follow her higher calcium diet and increase strengthening exercise to help strengthen her bones and decrease her risk of osteopenia and osteoporosis.  Obesity Ashlee Swanson is currently in the action stage of change. As such, her goal is to continue with weight loss efforts She has agreed to keep a food journal with 400 to 500 calories and 35 grams of protein for supper and to follow the Category 2 plan. Ashlee Swanson has been instructed to continue yoga or walking 2 times per week and also increase walking. We discussed the following Behavioral Modification Strategies today: increasing  lean protein intake (especially at dinner) and planning for success  Ashlee Swanson has agreed to follow up with our clinic in 3 weeks. She was informed of the importance of frequent follow up visits to maximize her success with intensive lifestyle modifications for her multiple health conditions.  ALLERGIES: Allergies  Allergen Reactions  . Minocycline Hives  . Codeine Nausea Only    Abdominal pain, shakes  . Tetracycline Hives    MEDICATIONS: Current Outpatient Medications on File Prior to Visit  Medication Sig Dispense Refill  . Eszopiclone 3 MG TABS Take 1 tablet (3 mg total) by mouth at bedtime as needed. Take immediately before bedtime 30 tablet 0  . Ibuprofen-diphenhydrAMINE Cit (ADVIL PM PO) Take by mouth.    . polyethylene glycol (MIRALAX / GLYCOLAX) packet Take 17 g by mouth daily.     No current facility-administered medications on file prior to visit.     PAST MEDICAL HISTORY: Past Medical History:  Diagnosis Date  . Arthritis    In the Back  . Back pain   . Bunion   . DDD (degenerative disc disease), lumbar   . High cholesterol   . IBS (irritable bowel syndrome)   . Insomnia   . Kidney stone   . Lactose intolerance   . Snoring     PAST SURGICAL HISTORY: Past Surgical History:  Procedure Laterality Date  . BUNIONECTOMY Left 1999   modified mcbride bunionectony    SOCIAL HISTORY: Social History   Tobacco Use  . Smoking status: Former Smoker    Packs/day: 0.25    Years: 10.00    Pack years: 2.50    Last attempt to quit: 05/29/2004    Years since quitting: 14.6  . Smokeless tobacco: Never Used  Substance Use Topics  . Alcohol use: Yes    Alcohol/week: 0.0 standard drinks    Comment: socially  . Drug use: No    FAMILY HISTORY: Family History  Problem Relation Age of Onset  . Colon cancer Paternal Aunt   . Breast cancer Maternal Aunt   . Heart disease Unknown   . Stroke Maternal Grandmother   . Hypertension Mother   . Hypertension Father   .  Hypertension Brother   . Stroke Paternal Grandfather     ROS: Review of Systems  Constitutional: Negative for weight loss.  Cardiovascular: Negative for chest pain.  Gastrointestinal: Negative for nausea and vomiting.  Musculoskeletal:       Negative for muscle weakness    PHYSICAL EXAM: Blood pressure 117/87, pulse 75, temperature 98 F (36.7 C), temperature source Oral, height 5\' 4"  (1.626 m), weight 182 lb (82.6 kg), SpO2 97 %. Body mass index is 31.24 kg/m. Physical Exam Vitals signs reviewed.  Constitutional:      Appearance: Normal appearance. She is obese.  Cardiovascular:     Rate and Rhythm: Normal rate.     Pulses: Normal pulses.  Pulmonary:  Effort: Pulmonary effort is normal.  Musculoskeletal: Normal range of motion.  Skin:    General: Skin is warm and dry.  Neurological:     Mental Status: She is alert and oriented to person, place, and time.  Psychiatric:        Mood and Affect: Mood normal.        Behavior: Behavior normal.     RECENT LABS AND TESTS: BMET    Component Value Date/Time   NA 139 06/09/2018 0903   K 4.2 06/09/2018 0903   CL 105 06/09/2018 0903   CO2 27 06/09/2018 0903   GLUCOSE 90 06/09/2018 0903   BUN 13 06/09/2018 0903   CREATININE 0.88 06/09/2018 0903   CALCIUM 9.5 06/09/2018 0903   GFRNONAA 74 12/31/2008 1110   GFRAA 90 12/31/2008 1110   Lab Results  Component Value Date   HGBA1C 5.2 09/07/2018   Lab Results  Component Value Date   INSULIN 6.8 09/07/2018   CBC    Component Value Date/Time   WBC 7.9 06/09/2018 0903   RBC 4.11 06/09/2018 0903   HGB 14.2 06/09/2018 0903   HCT 40.7 06/09/2018 0903   PLT 353.0 06/09/2018 0903   MCV 99.1 06/09/2018 0903   MCHC 34.9 06/09/2018 0903   RDW 12.3 06/09/2018 0903   LYMPHSABS 2.7 06/09/2018 0903   MONOABS 0.6 06/09/2018 0903   EOSABS 0.1 06/09/2018 0903   BASOSABS 0.1 06/09/2018 0903   Iron/TIBC/Ferritin/ %Sat No results found for: IRON, TIBC, FERRITIN,  IRONPCTSAT Lipid Panel     Component Value Date/Time   CHOL 186 01/16/2019 1317   TRIG 74 01/16/2019 1317   HDL 49 01/16/2019 1317   CHOLHDL 4 06/09/2018 0903   VLDL 19.4 06/09/2018 0903   LDLCALC 122 (H) 01/16/2019 1317   Hepatic Function Panel     Component Value Date/Time   PROT 7.0 06/09/2018 0903   ALBUMIN 4.3 06/09/2018 0903   AST 12 06/09/2018 0903   ALT 12 06/09/2018 0903   ALKPHOS 51 06/09/2018 0903   BILITOT 0.5 06/09/2018 0903   BILIDIR 0.1 12/31/2010 0933      Component Value Date/Time   TSH 2.04 06/09/2018 0903   TSH 1.68 06/03/2017 1044   TSH 1.57 05/29/2016 1036    Ref. Range 01/16/2019 13:17  Vitamin D, 25-Hydroxy Latest Ref Range: 30.0 - 100.0 ng/mL 45.2     OBESITY BEHAVIORAL INTERVENTION VISIT  Today's visit was # 10   Starting weight: 194 lbs Starting date: 09/07/2018 Today's weight :: 182 lbs Today's date: 02/01/2019 Total lbs lost to date: 12   ASK: We discussed the diagnosis of obesity with Prudence Davidson today and Natividad agreed to give [place] permission to discuss obesity behavioral modification therapy today.  ASSESS: Keiry has the diagnosis of obesity and her BMI today is 31.22 Taylie is in the action stage of change   ADVISE: Cynde was educated on the multiple health risks of obesity as well as the benefit of weight loss to improve her health. She was advised of the need for long term treatment and the importance of lifestyle modifications to improve her current health and to decrease her risk of future health problems.  AGREE: Multiple dietary modification options and treatment options were discussed and  Charmian agreed to follow the recommendations documented in the above note.  ARRANGE: Jackson was educated on the importance of frequent visits to treat obesity as outlined per CMS and USPSTF guidelines and agreed to schedule her next follow up appointment  today.  I, Tammy Wysor, am acting as Location manager for [employer],  FNP-C.  I have reviewed the above documentation for accuracy and completeness, and I agree with the above.  - Emanii Bugbee, FNP-C.

## 2019-02-22 ENCOUNTER — Ambulatory Visit (INDEPENDENT_AMBULATORY_CARE_PROVIDER_SITE_OTHER): Payer: 59 | Admitting: Family Medicine

## 2019-03-01 ENCOUNTER — Other Ambulatory Visit: Payer: Self-pay

## 2019-03-01 ENCOUNTER — Ambulatory Visit (INDEPENDENT_AMBULATORY_CARE_PROVIDER_SITE_OTHER): Payer: 59 | Admitting: Physician Assistant

## 2019-03-01 ENCOUNTER — Encounter (INDEPENDENT_AMBULATORY_CARE_PROVIDER_SITE_OTHER): Payer: Self-pay | Admitting: Physician Assistant

## 2019-03-01 VITALS — BP 120/78 | HR 75 | Temp 98.5°F | Ht 64.0 in | Wt 180.0 lb

## 2019-03-01 DIAGNOSIS — E559 Vitamin D deficiency, unspecified: Secondary | ICD-10-CM | POA: Diagnosis not present

## 2019-03-01 DIAGNOSIS — Z9189 Other specified personal risk factors, not elsewhere classified: Secondary | ICD-10-CM | POA: Diagnosis not present

## 2019-03-01 DIAGNOSIS — E669 Obesity, unspecified: Secondary | ICD-10-CM

## 2019-03-01 DIAGNOSIS — Z6830 Body mass index (BMI) 30.0-30.9, adult: Secondary | ICD-10-CM

## 2019-03-01 DIAGNOSIS — E7849 Other hyperlipidemia: Secondary | ICD-10-CM | POA: Diagnosis not present

## 2019-03-01 MED ORDER — VITAMIN D (ERGOCALCIFEROL) 1.25 MG (50000 UNIT) PO CAPS: 50000.0000 [IU] | ORAL_CAPSULE | ORAL | 0 refills | Status: DC

## 2019-03-01 NOTE — Progress Notes (Signed)
Office: [phone]  /  Fax: [phone]   HPI:   Chief Complaint: OBESITY Ashlee Swanson is here to discuss her progress with her obesity treatment plan. She is on the Category 2 plan and journaling at supper. She is following her eating plan approximately 75% of the time. She states she is doing yoga and walking 60 minutes 2 times per week. Ashlee Swanson did well with weight loss. She reports indulging on a couple of occasions recently due to social outings. Her weight is 180 lb (81.6 kg) today and has had a weight loss of 2 pounds over a period of 4 weeks since her last visit. She has lost 14 lbs since starting treatment with [place].  Vitamin D deficiency Ashlee Swanson has a diagnosis of Vitamin D deficiency. She is currently taking prescription Vit D and denies nausea, vomiting or muscle weakness.  Hyperlipidemia Ashlee Swanson has hyperlipidemia and has been trying to improve her cholesterol levels with intensive lifestyle modification including a low saturated fat diet, exercise and weight loss. She denies taking any medication and denies any chest pain.  At risk for cardiovascular disease Ashlee Swanson is at a higher than average risk for cardiovascular disease due to obesity. She currently denies any chest pain.  ASSESSMENT AND PLAN:  Vitamin D deficiency - Plan: Vitamin D, Ergocalciferol, (DRISDOL) 1.25 MG (50000 UT) CAPS capsule  Other hyperlipidemia  At risk for heart disease  Class 1 obesity with serious comorbidity and body mass index (BMI) of 30.0 to 30.9 in adult, unspecified obesity type  PLAN:  Vitamin D Deficiency Ashlee Swanson was informed that low Vitamin D levels contributes to fatigue and are associated with obesity, breast, and colon cancer. She agrees to continue to take prescription Vit D @ 50,000 IU every week #4 with 0 refills and will follow-up for routine testing of Vitamin D, at least 2-3 times per year. She was informed of the risk of over-replacement of Vitamin D and agrees to not increase her  dose unless she discusses this with [place] first. Ashlee Swanson agrees to follow-up with our clinic in 3 weeks.  Hyperlipidemia Ashlee Swanson was informed of the American Heart Association Guidelines emphasizing intensive lifestyle modifications as the first line treatment for hyperlipidemia. We discussed many lifestyle modifications today in depth, and Ashlee Swanson will continue to work on decreasing saturated fats such as fatty red meat, butter and many fried foods. She will also increase vegetables and lean protein in her diet and continue to work on exercise and weight loss efforts.  Cardiovascular risk counseling Ashlee Swanson was given extended (15 minutes) coronary artery disease prevention counseling today. She is 48 y.o. female and has risk factors for heart disease including obesity. We discussed intensive lifestyle modifications today with an emphasis on specific weight loss instructions and strategies. Pt was also informed of the importance of increasing exercise and decreasing saturated fats to help prevent heart disease.  Obesity Ashlee Swanson is currently in the action stage of change. As such, her goal is to continue with weight loss efforts. She has agreed to follow the Category 2 plan and will journal 400-500 calories + 35 grams of protein at supper. Ashlee Swanson has been instructed to work up to a goal of 150 minutes of combined cardio and strengthening exercise per week for weight loss and overall health benefits. We discussed the following Behavioral Modification Strategies today: work on meal planning and easy cooking plans and celebration eating strategies.  Ashlee Swanson has agreed to follow-up with our clinic in 3 weeks. She was informed of the importance  of frequent follow-up visits to maximize her success with intensive lifestyle modifications for her multiple health conditions.  ALLERGIES: Allergies  Allergen Reactions   Minocycline Hives   Codeine Nausea Only    Abdominal pain, shakes   Tetracycline Hives     MEDICATIONS: Current Outpatient Medications on File Prior to Visit  Medication Sig Dispense Refill   Eszopiclone 3 MG TABS Take 1 tablet (3 mg total) by mouth at bedtime as needed. Take immediately before bedtime 30 tablet 0   Ibuprofen-diphenhydrAMINE Cit (ADVIL PM PO) Take by mouth.     polyethylene glycol (MIRALAX / GLYCOLAX) packet Take 17 g by mouth daily.     No current facility-administered medications on file prior to visit.     PAST MEDICAL HISTORY: Past Medical History:  Diagnosis Date   Arthritis    In the Back   Back pain    Bunion    DDD (degenerative disc disease), lumbar    High cholesterol    IBS (irritable bowel syndrome)    Insomnia    Kidney stone    Lactose intolerance    Snoring     PAST SURGICAL HISTORY: Past Surgical History:  Procedure Laterality Date   BUNIONECTOMY Left 1999   modified mcbride bunionectony    SOCIAL HISTORY: Social History   Tobacco Use   Smoking status: Former Smoker    Packs/day: 0.25    Years: 10.00    Pack years: 2.50    Last attempt to quit: 05/29/2004    Years since quitting: 14.7   Smokeless tobacco: Never Used  Substance Use Topics   Alcohol use: Yes    Alcohol/week: 0.0 standard drinks    Comment: socially   Drug use: No    FAMILY HISTORY: Family History  Problem Relation Age of Onset   Colon cancer Paternal 55    Breast cancer Maternal Aunt    Heart disease Unknown    Stroke Maternal Grandmother    Hypertension Mother    Hypertension Father    Hypertension Brother    Stroke Paternal Grandfather    ROS: Review of Systems  Constitutional: Positive for weight loss.  Cardiovascular: Negative for chest pain.  Gastrointestinal: Negative for nausea and vomiting.  Musculoskeletal:       Negative for muscle weakness.  Endo/Heme/Allergies:       Negative for hypoglycemia.   PHYSICAL EXAM: Blood pressure 120/78, pulse 75, temperature 98.5 F (36.9 C), temperature source  Oral, height 5\' 4"  (1.626 m), weight 180 lb (81.6 kg), SpO2 98 %. Body mass index is 30.9 kg/m. Physical Exam Vitals signs reviewed.  Constitutional:      Appearance: Normal appearance. She is obese.  Cardiovascular:     Rate and Rhythm: Normal rate.     Pulses: Normal pulses.  Pulmonary:     Effort: Pulmonary effort is normal.     Breath sounds: Normal breath sounds.  Musculoskeletal: Normal range of motion.  Skin:    General: Skin is warm and dry.  Neurological:     Mental Status: She is alert and oriented to person, place, and time.  Psychiatric:        Behavior: Behavior normal.   RECENT LABS AND TESTS: BMET    Component Value Date/Time   NA 139 06/09/2018 0903   K 4.2 06/09/2018 0903   CL 105 06/09/2018 0903   CO2 27 06/09/2018 0903   GLUCOSE 90 06/09/2018 0903   BUN 13 06/09/2018 0903   CREATININE 0.88 06/09/2018 5361  CALCIUM 9.5 06/09/2018 0903   GFRNONAA 74 12/31/2008 1110   GFRAA 90 12/31/2008 1110   Lab Results  Component Value Date   HGBA1C 5.2 09/07/2018   Lab Results  Component Value Date   INSULIN 6.8 09/07/2018   CBC    Component Value Date/Time   WBC 7.9 06/09/2018 0903   RBC 4.11 06/09/2018 0903   HGB 14.2 06/09/2018 0903   HCT 40.7 06/09/2018 0903   PLT 353.0 06/09/2018 0903   MCV 99.1 06/09/2018 0903   MCHC 34.9 06/09/2018 0903   RDW 12.3 06/09/2018 0903   LYMPHSABS 2.7 06/09/2018 0903   MONOABS 0.6 06/09/2018 0903   EOSABS 0.1 06/09/2018 0903   BASOSABS 0.1 06/09/2018 0903   Iron/TIBC/Ferritin/ %Sat No results found for: IRON, TIBC, FERRITIN, IRONPCTSAT Lipid Panel     Component Value Date/Time   CHOL 186 01/16/2019 1317   TRIG 74 01/16/2019 1317   HDL 49 01/16/2019 1317   CHOLHDL 4 06/09/2018 0903   VLDL 19.4 06/09/2018 0903   LDLCALC 122 (H) 01/16/2019 1317   Hepatic Function Panel     Component Value Date/Time   PROT 7.0 06/09/2018 0903   ALBUMIN 4.3 06/09/2018 0903   AST 12 06/09/2018 0903   ALT 12 06/09/2018 0903    ALKPHOS 51 06/09/2018 0903   BILITOT 0.5 06/09/2018 0903   BILIDIR 0.1 12/31/2010 0933      Component Value Date/Time   TSH 2.04 06/09/2018 0903   TSH 1.68 06/03/2017 1044   TSH 1.57 05/29/2016 1036   Results for LARAYNE, BAXLEY (MRN [id number]) as of 03/01/2019 16:02  Ref. Range 01/16/2019 13:17  Vitamin D, 25-Hydroxy Latest Ref Range: 30.0 - 100.0 ng/mL 45.2   OBESITY BEHAVIORAL INTERVENTION VISIT  Today's visit was #11  Starting weight: 194 lbs Starting date: 09/07/2018 Today's weight: 180 lbs  Today's date: 03/01/2019 Total lbs lost to date: 14    03/01/2019  Height 5\' 4"  (1.626 m)  Weight 180 lb (81.6 kg)  BMI (Calculated) 30.88  BLOOD PRESSURE - SYSTOLIC 818  BLOOD PRESSURE - DIASTOLIC 78   Body Fat % 29.9 %  Total Body Water (lbs) 74.8 lbs   ASK: We discussed the diagnosis of obesity with Ashlee Swanson today and Khari agreed to give [place] permission to discuss obesity behavioral modification therapy today.  ASSESS: Ashlee Swanson has the diagnosis of obesity and her BMI today is 30.88. Chaneka is in the action stage of change.   ADVISE: Ashlee Swanson was educated on the multiple health risks of obesity as well as the benefit of weight loss to improve her health. She was advised of the need for long term treatment and the importance of lifestyle modifications to improve her current health and to decrease her risk of future health problems.  AGREE: Multiple dietary modification options and treatment options were discussed and  Ashlee Swanson agreed to follow the recommendations documented in the above note.  ARRANGE: Ashlee Swanson was educated on the importance of frequent visits to treat obesity as outlined per CMS and USPSTF guidelines and agreed to schedule her next follow-up appointment today.  Migdalia Dk, am acting as transcriptionist for Abby Potash, PA-C I, Abby Potash, PA-C have reviewed above note and agree with its content

## 2019-03-15 ENCOUNTER — Encounter (INDEPENDENT_AMBULATORY_CARE_PROVIDER_SITE_OTHER): Payer: Self-pay

## 2019-03-21 ENCOUNTER — Encounter (INDEPENDENT_AMBULATORY_CARE_PROVIDER_SITE_OTHER): Payer: Self-pay

## 2019-03-22 ENCOUNTER — Other Ambulatory Visit: Payer: Self-pay

## 2019-03-22 ENCOUNTER — Ambulatory Visit (INDEPENDENT_AMBULATORY_CARE_PROVIDER_SITE_OTHER): Payer: 59 | Admitting: Family Medicine

## 2019-03-22 ENCOUNTER — Encounter (INDEPENDENT_AMBULATORY_CARE_PROVIDER_SITE_OTHER): Payer: Self-pay | Admitting: Family Medicine

## 2019-03-22 DIAGNOSIS — Z6830 Body mass index (BMI) 30.0-30.9, adult: Secondary | ICD-10-CM

## 2019-03-22 DIAGNOSIS — E7849 Other hyperlipidemia: Secondary | ICD-10-CM

## 2019-03-22 DIAGNOSIS — E559 Vitamin D deficiency, unspecified: Secondary | ICD-10-CM

## 2019-03-22 DIAGNOSIS — E669 Obesity, unspecified: Secondary | ICD-10-CM

## 2019-03-22 MED ORDER — VITAMIN D (ERGOCALCIFEROL) 1.25 MG (50000 UNIT) PO CAPS: 50000.0000 [IU] | ORAL_CAPSULE | ORAL | 0 refills | Status: DC

## 2019-03-22 NOTE — Progress Notes (Signed)
Office: [phone]  /  Fax: [phone] TeleHealth Visit:  Ashlee Swanson has verbally consented to this TeleHealth visit today. The patient is located at home, the provider is located at the [facility] office. The participants in this visit include the listed provider and patient. The visit was conducted today via FaceTime.  HPI:   Chief Complaint: OBESITY Ashlee Swanson is here to discuss her progress with her obesity treatment plan. She is on the Category 2 plan and journaling 400-500 calories + 35 grams of protein at supper. She is following her eating plan approximately 70% of the time. She states she is doing yoga 60 minutes 1 time per week and walking 2 hours 2 times per week.  Ashlee Swanson reports increased stress and decreased availability of food has caused her to struggle on the plan. She states she weighed 185 lbs on her scale, which is higher than the clinic scale by 3 lbs. She states she plans on getting back on the plan this week. We were unable to weigh the patient today for this TeleHealth visit. She feels as if she has gained weight since her last visit. She has lost 14 lbs since starting treatment with [place].  Vitamin D deficiency Ashlee Swanson has a diagnosis of Vitamin D deficiency, which is not at goal. Her last Vitamin D level was reported at 45.2 on 01/16/2019. She is currently taking prescription Vit D and denies nausea, vomiting or muscle weakness.  Hyperlipidemia Ashlee Swanson has hyperlipidemia and is currently not taking a statin. She has been trying to improve her cholesterol levels with intensive lifestyle modification including a low saturated fat diet, exercise and weight loss. She denies any chest pain or shortness of breath. The 10-year ASCVD risk score Ashlee Swanson DC Ashlee Swanson., et al., 2013) is: 1%   Values used to calculate the score:     Age: 42 years     Sex: Female     Is Non-Hispanic African American: No     Diabetic: No     Tobacco smoker: No     Systolic Blood  Pressure: 120 mmHg     Is BP treated: No     HDL Cholesterol: 49 mg/dL     Total Cholesterol: 186 mg/dL   ASSESSMENT AND PLAN:  Vitamin D deficiency - Plan: Vitamin D, Ergocalciferol, (DRISDOL) 1.25 MG (50000 UT) CAPS capsule  Other hyperlipidemia  Class 1 obesity with serious comorbidity and body mass index (BMI) of 30.0 to 30.9 in adult, unspecified obesity type  PLAN:  Vitamin D Deficiency Ashlee Swanson was informed that low Vitamin D levels contributes to fatigue and are associated with obesity, breast, and colon cancer. She agrees to continue to take prescription Vit D @ 50,000 IU every week #4 with 0 refills and will follow-up for routine testing of Vitamin D, at least 2-3 times per year. She was informed of the risk of over-replacement of Vitamin D and agrees to not increase her dose unless she discusses this with [place] first. Ashlee Swanson agrees to follow-up with our clinic in 2-3 weeks.  Hyperlipidemia Ashlee Swanson was informed of the American Heart Association Guidelines emphasizing intensive lifestyle modifications as the first line treatment for hyperlipidemia. We discussed many lifestyle modifications today in depth, and Ashlee Swanson will continue to work on decreasing saturated fats such as fatty red meat, butter and many fried foods. She will also increase vegetables and lean protein in her diet and continue to work on exercise and weight loss efforts. Statin is not indicated according to  ASCVD risk score.   Obesity Ashlee Swanson is currently in the action stage of change. As such, her goal is to continue with weight loss efforts. She has agreed to follow the Category 2 plan and journal 400-500 calories + 35 grams of protein at supper. Ashlee Swanson has been instructed to continue yoga and walking as she has been doing. We discussed the following Behavioral Modification Strategies today: increasing lean protein intake and planning for success.  Ashlee Swanson has agreed to follow-up with our clinic in 2-3 weeks. She was  informed of the importance of frequent follow-up visits to maximize her success with intensive lifestyle modifications for her multiple health conditions.  ALLERGIES: Allergies  Allergen Reactions  . Minocycline Hives  . Codeine Nausea Only    Abdominal pain, shakes  . Tetracycline Hives    MEDICATIONS: Current Outpatient Medications on File Prior to Visit  Medication Sig Dispense Refill  . Eszopiclone 3 MG TABS Take 1 tablet (3 mg total) by mouth at bedtime as needed. Take immediately before bedtime 30 tablet 0  . Ibuprofen-diphenhydrAMINE Cit (ADVIL PM PO) Take by mouth.    . polyethylene glycol (MIRALAX / GLYCOLAX) packet Take 17 g by mouth daily.     No current facility-administered medications on file prior to visit.     PAST MEDICAL HISTORY: Past Medical History:  Diagnosis Date  . Arthritis    In the Back  . Back pain   . Bunion   . DDD (degenerative disc disease), lumbar   . High cholesterol   . IBS (irritable bowel syndrome)   . Insomnia   . Kidney stone   . Lactose intolerance   . Snoring     PAST SURGICAL HISTORY: Past Surgical History:  Procedure Laterality Date  . BUNIONECTOMY Left 1999   modified mcbride bunionectony    SOCIAL HISTORY: Social History   Tobacco Use  . Smoking status: Former Smoker    Packs/day: 0.25    Years: 10.00    Pack years: 2.50    Last attempt to quit: 05/29/2004    Years since quitting: 14.8  . Smokeless tobacco: Never Used  Substance Use Topics  . Alcohol use: Yes    Alcohol/week: 0.0 standard drinks    Comment: socially  . Drug use: No    FAMILY HISTORY: Family History  Problem Relation Age of Onset  . Colon cancer Paternal Aunt   . Breast cancer Maternal Aunt   . Heart disease Unknown   . Stroke Maternal Grandmother   . Hypertension Mother   . Hypertension Father   . Hypertension Brother   . Stroke Paternal Grandfather    ROS: Review of Systems  Respiratory: Negative for shortness of breath.    Cardiovascular: Negative for chest pain.  Gastrointestinal: Negative for nausea and vomiting.  Musculoskeletal:       Negative for muscle weakness.   PHYSICAL EXAM: Pt in no acute distress  RECENT LABS AND TESTS: BMET    Component Value Date/Time   NA 139 06/09/2018 0903   K 4.2 06/09/2018 0903   CL 105 06/09/2018 0903   CO2 27 06/09/2018 0903   GLUCOSE 90 06/09/2018 0903   BUN 13 06/09/2018 0903   CREATININE 0.88 06/09/2018 0903   CALCIUM 9.5 06/09/2018 0903   GFRNONAA 74 12/31/2008 1110   GFRAA 90 12/31/2008 1110   Lab Results  Component Value Date   HGBA1C 5.2 09/07/2018   Lab Results  Component Value Date   INSULIN 6.8 09/07/2018   CBC  Component Value Date/Time   WBC 7.9 06/09/2018 0903   RBC 4.11 06/09/2018 0903   HGB 14.2 06/09/2018 0903   HCT 40.7 06/09/2018 0903   PLT 353.0 06/09/2018 0903   MCV 99.1 06/09/2018 0903   MCHC 34.9 06/09/2018 0903   RDW 12.3 06/09/2018 0903   LYMPHSABS 2.7 06/09/2018 0903   MONOABS 0.6 06/09/2018 0903   EOSABS 0.1 06/09/2018 0903   BASOSABS 0.1 06/09/2018 0903   Iron/TIBC/Ferritin/ %Sat No results found for: IRON, TIBC, FERRITIN, IRONPCTSAT Lipid Panel     Component Value Date/Time   CHOL 186 01/16/2019 1317   TRIG 74 01/16/2019 1317   HDL 49 01/16/2019 1317   CHOLHDL 4 06/09/2018 0903   VLDL 19.4 06/09/2018 0903   LDLCALC 122 (H) 01/16/2019 1317   Hepatic Function Panel     Component Value Date/Time   PROT 7.0 06/09/2018 0903   ALBUMIN 4.3 06/09/2018 0903   AST 12 06/09/2018 0903   ALT 12 06/09/2018 0903   ALKPHOS 51 06/09/2018 0903   BILITOT 0.5 06/09/2018 0903   BILIDIR 0.1 12/31/2010 0933      Component Value Date/Time   TSH 2.04 06/09/2018 0903   TSH 1.68 06/03/2017 1044   TSH 1.57 05/29/2016 1036   Results for MAKENSIE, MULHALL (MRN [id number]) as of 03/22/2019 14:27  Ref. Range 01/16/2019 13:17  Vitamin D, 25-Hydroxy Latest Ref Range: 30.0 - 100.0 ng/mL 45.2    I, Michaelene Song, am acting  as Location manager for [employer], FNP-C.  I have reviewed the above documentation for accuracy and completeness, and I agree with the above.  - Chrysten Woulfe, FNP-C.

## 2019-04-12 ENCOUNTER — Ambulatory Visit (INDEPENDENT_AMBULATORY_CARE_PROVIDER_SITE_OTHER): Payer: 59 | Admitting: Family Medicine

## 2019-04-12 ENCOUNTER — Other Ambulatory Visit: Payer: Self-pay

## 2019-04-12 ENCOUNTER — Encounter (INDEPENDENT_AMBULATORY_CARE_PROVIDER_SITE_OTHER): Payer: Self-pay | Admitting: Family Medicine

## 2019-04-12 DIAGNOSIS — Z6830 Body mass index (BMI) 30.0-30.9, adult: Secondary | ICD-10-CM | POA: Diagnosis not present

## 2019-04-12 DIAGNOSIS — E8881 Metabolic syndrome: Secondary | ICD-10-CM

## 2019-04-12 DIAGNOSIS — E669 Obesity, unspecified: Secondary | ICD-10-CM

## 2019-04-12 DIAGNOSIS — E88819 Insulin resistance, unspecified: Secondary | ICD-10-CM | POA: Insufficient documentation

## 2019-04-12 NOTE — Progress Notes (Signed)
Office: [phone]  /  Fax: [phone] TeleHealth Visit:  Ashlee Swanson has verbally consented to this TeleHealth visit today. The patient is located at home, the provider is located at the [facility] office. The participants in this visit include the listed provider and patient. The visit was conducted today via Face Time.  HPI:   Chief Complaint: OBESITY Ashlee Swanson is here to discuss her progress with her obesity treatment plan. She is on the Category 2 plan and keeping a food journal with 400 to 500 calories and 35+ grams of protein for supper and is following her eating plan approximately 75 % of the time. She states she is doing yoga and calisthenics 30 minutes 7 times per week. Ashlee Swanson feels that she has maintained her weight. Her goal is 175 pounds and she is within 10 pounds of that. Ashlee Swanson does better on the plan during the week than on weekends.  We were unable to weigh the patient today for this TeleHealth visit. She feels as if she has maintained weight since her last visit. She has lost 14 lbs since starting treatment with [place].  Insulin Resistance [name] has a diagnosis of insulin resistance based on her elevated fasting insulin level >5. Although Ashlee Swanson's blood glucose readings are still under good control, insulin resistance puts her at greater risk of metabolic syndrome and diabetes. She is not taking metformin currently and continues to work on diet and exercise to decrease risk of diabetes. Hema denies polyphagia. Lab Results  Component Value Date   HGBA1C 5.2 09/07/2018    ASSESSMENT AND PLAN:  Insulin resistance  Class 1 obesity with serious comorbidity and body mass index (BMI) of 30.0 to 30.9 in adult, unspecified obesity type  PLAN:  Insulin Resistance Ashlee Swanson will continue to work on weight loss, exercise, and decreasing simple carbohydrates in her diet to help decrease the risk of diabetes. She was informed that eating too many simple  carbohydrates or too many calories at one sitting increases the likelihood of GI side effects. Ashlee Swanson agreed to continue her meal plan and to follow up with [place] as directed to monitor her progress in 2 weeks.  I spent > than 50% of the 15 minute visit on counseling as documented in the note.  Obesity Ashlee Swanson is currently in the action stage of change. As such, her goal is to continue with weight loss efforts. She has agreed to follow the Category 2 plan or keep a food journal with 1200 to 1300 calories and 85 grams of protein daily. Ashlee Swanson has been instructed to continue yoga and calisthenics. We discussed the following Behavioral Modification Strategies today: planning for success and keeping a strict food journal.  Ashlee Swanson has agreed to follow up with our clinic in 2 weeks. She was informed of the importance of frequent follow up visits to maximize her success with intensive lifestyle modifications for her multiple health conditions.  ALLERGIES: Allergies  Allergen Reactions  . Minocycline Hives  . Codeine Nausea Only    Abdominal pain, shakes  . Tetracycline Hives    MEDICATIONS: Current Outpatient Medications on File Prior to Visit  Medication Sig Dispense Refill  . Eszopiclone 3 MG TABS Take 1 tablet (3 mg total) by mouth at bedtime as needed. Take immediately before bedtime 30 tablet 0  . Ibuprofen-diphenhydrAMINE Cit (ADVIL PM PO) Take by mouth.    . polyethylene glycol (MIRALAX / GLYCOLAX) packet Take 17 g by mouth daily.    . Vitamin D, Ergocalciferol, (  DRISDOL) 1.25 MG (50000 UT) CAPS capsule Take 1 capsule (50,000 Units total) by mouth every 7 (seven) days. 4 capsule 0   No current facility-administered medications on file prior to visit.     PAST MEDICAL HISTORY: Past Medical History:  Diagnosis Date  . Arthritis    In the Back  . Back pain   . Bunion   . DDD (degenerative disc disease), lumbar   . High cholesterol   . IBS (irritable bowel syndrome)   . Insomnia    . Kidney stone   . Lactose intolerance   . Snoring     PAST SURGICAL HISTORY: Past Surgical History:  Procedure Laterality Date  . BUNIONECTOMY Left 1999   modified mcbride bunionectony    SOCIAL HISTORY: Social History   Tobacco Use  . Smoking status: Former Smoker    Packs/day: 0.25    Years: 10.00    Pack years: 2.50    Last attempt to quit: 05/29/2004    Years since quitting: 14.8  . Smokeless tobacco: Never Used  Substance Use Topics  . Alcohol use: Yes    Alcohol/week: 0.0 standard drinks    Comment: socially  . Drug use: No    FAMILY HISTORY: Family History  Problem Relation Age of Onset  . Colon cancer Paternal Aunt   . Breast cancer Maternal Aunt   . Heart disease Unknown   . Stroke Maternal Grandmother   . Hypertension Mother   . Hypertension Father   . Hypertension Brother   . Stroke Paternal Grandfather     ROS: Review of Systems  Endo/Heme/Allergies:       Negative for polyphagia.    PHYSICAL EXAM: Pt in no acute distress  RECENT LABS AND TESTS: BMET    Component Value Date/Time   NA 139 06/09/2018 0903   K 4.2 06/09/2018 0903   CL 105 06/09/2018 0903   CO2 27 06/09/2018 0903   GLUCOSE 90 06/09/2018 0903   BUN 13 06/09/2018 0903   CREATININE 0.88 06/09/2018 0903   CALCIUM 9.5 06/09/2018 0903   GFRNONAA 74 12/31/2008 1110   GFRAA 90 12/31/2008 1110   Lab Results  Component Value Date   HGBA1C 5.2 09/07/2018   Lab Results  Component Value Date   INSULIN 6.8 09/07/2018   CBC    Component Value Date/Time   WBC 7.9 06/09/2018 0903   RBC 4.11 06/09/2018 0903   HGB 14.2 06/09/2018 0903   HCT 40.7 06/09/2018 0903   PLT 353.0 06/09/2018 0903   MCV 99.1 06/09/2018 0903   MCHC 34.9 06/09/2018 0903   RDW 12.3 06/09/2018 0903   LYMPHSABS 2.7 06/09/2018 0903   MONOABS 0.6 06/09/2018 0903   EOSABS 0.1 06/09/2018 0903   BASOSABS 0.1 06/09/2018 0903   Iron/TIBC/Ferritin/ %Sat No results found for: IRON, TIBC, FERRITIN, IRONPCTSAT  Lipid Panel     Component Value Date/Time   CHOL 186 01/16/2019 1317   TRIG 74 01/16/2019 1317   HDL 49 01/16/2019 1317   CHOLHDL 4 06/09/2018 0903   VLDL 19.4 06/09/2018 0903   LDLCALC 122 (H) 01/16/2019 1317   Hepatic Function Panel     Component Value Date/Time   PROT 7.0 06/09/2018 0903   ALBUMIN 4.3 06/09/2018 0903   AST 12 06/09/2018 0903   ALT 12 06/09/2018 0903   ALKPHOS 51 06/09/2018 0903   BILITOT 0.5 06/09/2018 0903   BILIDIR 0.1 12/31/2010 0933      Component Value Date/Time   TSH 2.04 06/09/2018 1749  TSH 1.68 06/03/2017 1044   TSH 1.57 05/29/2016 1036    Results for SYLENA, LOTTER (MRN [id number]) as of 04/12/2019 10:43  Ref. Range 01/16/2019 13:17  Vitamin D, 25-Hydroxy Latest Ref Range: 30.0 - 100.0 ng/mL 45.2    I, Marcille Blanco, CMA, am acting as Location manager for [employer], FNP-C.  I have reviewed the above documentation for accuracy and completeness, and I agree with the above.  - Tarus Briski, FNP-C.

## 2019-04-14 ENCOUNTER — Other Ambulatory Visit (INDEPENDENT_AMBULATORY_CARE_PROVIDER_SITE_OTHER): Payer: Self-pay | Admitting: Family Medicine

## 2019-04-14 DIAGNOSIS — E559 Vitamin D deficiency, unspecified: Secondary | ICD-10-CM

## 2019-04-26 ENCOUNTER — Encounter (INDEPENDENT_AMBULATORY_CARE_PROVIDER_SITE_OTHER): Payer: Self-pay | Admitting: Family Medicine

## 2019-04-26 ENCOUNTER — Ambulatory Visit (INDEPENDENT_AMBULATORY_CARE_PROVIDER_SITE_OTHER): Payer: 59 | Admitting: Family Medicine

## 2019-04-26 ENCOUNTER — Other Ambulatory Visit: Payer: Self-pay

## 2019-04-26 DIAGNOSIS — E559 Vitamin D deficiency, unspecified: Secondary | ICD-10-CM | POA: Diagnosis not present

## 2019-04-26 DIAGNOSIS — E8881 Metabolic syndrome: Secondary | ICD-10-CM

## 2019-04-26 DIAGNOSIS — E669 Obesity, unspecified: Secondary | ICD-10-CM

## 2019-04-26 DIAGNOSIS — Z6830 Body mass index (BMI) 30.0-30.9, adult: Secondary | ICD-10-CM

## 2019-04-26 MED ORDER — VITAMIN D (ERGOCALCIFEROL) 1.25 MG (50000 UNIT) PO CAPS: 50000.0000 [IU] | ORAL_CAPSULE | ORAL | 0 refills | Status: DC

## 2019-04-26 NOTE — Progress Notes (Signed)
Office: [phone]  /  Fax: [phone] TeleHealth Visit:  Ashlee Swanson has verbally consented to this TeleHealth visit today. The patient is located at home, the provider is located at the [facility] office. The participants in this visit include the listed provider and patient. The visit was conducted today via FaceTime.  HPI:   Chief Complaint: OBESITY Ashlee Swanson is here to discuss her progress with her obesity treatment plan. She is on the Category 2 plan and is following her eating plan approximately 60% of the time. She states she is exercising 0 minutes 0 times per week. Ashlee Swanson states she weighed 185 lbs today. She struggles on weekends, at dinner and after dinner. On weekends she has had too much beer and does not eat all of her protein. Her family is getting bored with the meals she is fixing for dinner.  We were unable to weigh the patient today for this TeleHealth visit. She feels as if she has gained 1 lb weight since her last visit. She has lost 14 lbs since starting treatment with [place].  Vitamin D deficiency Ashlee Swanson has a diagnosis of Vitamin D deficiency, which is not at goal. Her last Vitamin D level was reported to be 45.2 on 01/16/2019. She is currently taking prescription Vit D and denies nausea, vomiting or muscle weakness.  Insulin Resistance Ashlee Swanson has a diagnosis of insulin resistance based on her elevated fasting insulin level >5. Although Ashlee Swanson's blood glucose readings are still under good control, insulin resistance puts her at greater risk of metabolic syndrome and diabetes. She is not taking metformin currently and continues to work on diet and exercise to decrease risk of diabetes. No polyphagia. Lab Results  Component Value Date   HGBA1C 5.2 09/07/2018    ASSESSMENT AND PLAN:  Vitamin D deficiency - Plan: Vitamin D, Ergocalciferol, (DRISDOL) 1.25 MG (50000 UT) CAPS capsule  Insulin resistance  Class 1 obesity with serious comorbidity  and body mass index (BMI) of 30.0 to 30.9 in adult, unspecified obesity type  PLAN:  Vitamin D Deficiency Ashlee Swanson was informed that low Vitamin D levels contributes to fatigue and are associated with obesity, breast, and colon cancer. She agrees to continue to take prescription Vit D @ 50,000 IU every week #4 with 0 refills and will follow-up for routine testing of Vitamin D, at least 2-3 times per year. She was informed of the risk of over-replacement of Vitamin D and agrees to not increase her dose unless she discusses this with [place] first. Ashlee Swanson agrees to follow-up with our clinic in 2 weeks.  Insulin Resistance Ashlee Swanson will continue to work on weight loss, exercise, and decreasing simple carbohydrates in her diet to help decrease the risk of diabetes. [name] will continue her meal plan and will follow-up with our clinic as directed to monitor her progress.  Obesity Ashlee Swanson is currently in the action stage of change. As such, her goal is to continue with weight loss efforts. She has agreed to follow the Category 2 plan and journal 400-500 calories + 35 grams of protein at supper. Handouts were sent to the patient via MyChart on Recipes. Ashlee Swanson has been instructed to start back exercising 30 minutes 3-4 days per week for weight loss and overall health benefits. We discussed the following Behavioral Modification Strategies today: increasing lean protein intake, decrease liquid calories, work on meal planning, easy cooking plans, keeping healthy foods in the home, and planning for success.  Ashlee Swanson has agreed to follow-up with our  clinic in 2 weeks. She was informed of the importance of frequent follow-up visits to maximize her success with intensive lifestyle modifications for her multiple health conditions.  ALLERGIES: Allergies  Allergen Reactions  . Minocycline Hives  . Codeine Nausea Only    Abdominal pain, shakes  . Tetracycline Hives    MEDICATIONS: Current Outpatient Medications on  File Prior to Visit  Medication Sig Dispense Refill  . Eszopiclone 3 MG TABS Take 1 tablet (3 mg total) by mouth at bedtime as needed. Take immediately before bedtime 30 tablet 0  . Ibuprofen-diphenhydrAMINE Cit (ADVIL PM PO) Take by mouth.    . polyethylene glycol (MIRALAX / GLYCOLAX) packet Take 17 g by mouth daily.     No current facility-administered medications on file prior to visit.     PAST MEDICAL HISTORY: Past Medical History:  Diagnosis Date  . Arthritis    In the Back  . Back pain   . Bunion   . DDD (degenerative disc disease), lumbar   . High cholesterol   . IBS (irritable bowel syndrome)   . Insomnia   . Kidney stone   . Lactose intolerance   . Snoring     PAST SURGICAL HISTORY: Past Surgical History:  Procedure Laterality Date  . BUNIONECTOMY Left 1999   modified mcbride bunionectony    SOCIAL HISTORY: Social History   Tobacco Use  . Smoking status: Former Smoker    Packs/day: 0.25    Years: 10.00    Pack years: 2.50    Last attempt to quit: 05/29/2004    Years since quitting: 14.9  . Smokeless tobacco: Never Used  Substance Use Topics  . Alcohol use: Yes    Alcohol/week: 0.0 standard drinks    Comment: socially  . Drug use: No    FAMILY HISTORY: Family History  Problem Relation Age of Onset  . Colon cancer Paternal Aunt   . Breast cancer Maternal Aunt   . Heart disease Unknown   . Stroke Maternal Grandmother   . Hypertension Mother   . Hypertension Father   . Hypertension Brother   . Stroke Paternal Grandfather    ROS: Review of Systems  Gastrointestinal: Negative for nausea and vomiting.  Musculoskeletal:       Negative for muscle weakness.  Endo/Heme/Allergies:       Negative for polyphagia.   PHYSICAL EXAM: Pt in no acute distress  RECENT LABS AND TESTS: BMET    Component Value Date/Time   NA 139 06/09/2018 0903   K 4.2 06/09/2018 0903   CL 105 06/09/2018 0903   CO2 27 06/09/2018 0903   GLUCOSE 90 06/09/2018 0903   BUN  13 06/09/2018 0903   CREATININE 0.88 06/09/2018 0903   CALCIUM 9.5 06/09/2018 0903   GFRNONAA 74 12/31/2008 1110   GFRAA 90 12/31/2008 1110   Lab Results  Component Value Date   HGBA1C 5.2 09/07/2018   Lab Results  Component Value Date   INSULIN 6.8 09/07/2018   CBC    Component Value Date/Time   WBC 7.9 06/09/2018 0903   RBC 4.11 06/09/2018 0903   HGB 14.2 06/09/2018 0903   HCT 40.7 06/09/2018 0903   PLT 353.0 06/09/2018 0903   MCV 99.1 06/09/2018 0903   MCHC 34.9 06/09/2018 0903   RDW 12.3 06/09/2018 0903   LYMPHSABS 2.7 06/09/2018 0903   MONOABS 0.6 06/09/2018 0903   EOSABS 0.1 06/09/2018 0903   BASOSABS 0.1 06/09/2018 0903   Iron/TIBC/Ferritin/ %Sat No results found for: IRON,  TIBC, FERRITIN, IRONPCTSAT Lipid Panel     Component Value Date/Time   CHOL 186 01/16/2019 1317   TRIG 74 01/16/2019 1317   HDL 49 01/16/2019 1317   CHOLHDL 4 06/09/2018 0903   VLDL 19.4 06/09/2018 0903   LDLCALC 122 (H) 01/16/2019 1317   Hepatic Function Panel     Component Value Date/Time   PROT 7.0 06/09/2018 0903   ALBUMIN 4.3 06/09/2018 0903   AST 12 06/09/2018 0903   ALT 12 06/09/2018 0903   ALKPHOS 51 06/09/2018 0903   BILITOT 0.5 06/09/2018 0903   BILIDIR 0.1 12/31/2010 0933      Component Value Date/Time   TSH 2.04 06/09/2018 0903   TSH 1.68 06/03/2017 1044   TSH 1.57 05/29/2016 1036   Results for JANIT, CUTTER (MRN [id number]) as of 04/26/2019 13:51  Ref. Range 01/16/2019 13:17  Vitamin D, 25-Hydroxy Latest Ref Range: 30.0 - 100.0 ng/mL 45.2    I, Michaelene Song, am acting as Location manager for [employer], FNP-C.  I have reviewed the above documentation for accuracy and completeness, and I agree with the above.  - Daiya Tamer, FNP-C.

## 2019-04-27 ENCOUNTER — Encounter (INDEPENDENT_AMBULATORY_CARE_PROVIDER_SITE_OTHER): Payer: Self-pay | Admitting: Family Medicine

## 2019-05-02 ENCOUNTER — Other Ambulatory Visit: Payer: Self-pay | Admitting: Family Medicine

## 2019-05-02 MED ORDER — ESZOPICLONE 3 MG PO TABS: 3.0000 mg | ORAL_TABLET | Freq: Every evening | ORAL | 0 refills | Status: DC | PRN

## 2019-05-02 NOTE — Telephone Encounter (Signed)
Lunesta refill.   Last OV: 06/09/2018 Last Fill: 10/24/2018 #30 and 0RF UDS: 06/09/2018 Low risk

## 2019-05-10 ENCOUNTER — Ambulatory Visit (INDEPENDENT_AMBULATORY_CARE_PROVIDER_SITE_OTHER): Payer: 59 | Admitting: Family Medicine

## 2019-05-10 ENCOUNTER — Other Ambulatory Visit: Payer: Self-pay

## 2019-05-10 ENCOUNTER — Encounter (INDEPENDENT_AMBULATORY_CARE_PROVIDER_SITE_OTHER): Payer: Self-pay | Admitting: Family Medicine

## 2019-05-10 DIAGNOSIS — E559 Vitamin D deficiency, unspecified: Secondary | ICD-10-CM | POA: Diagnosis not present

## 2019-05-10 DIAGNOSIS — Z6830 Body mass index (BMI) 30.0-30.9, adult: Secondary | ICD-10-CM

## 2019-05-10 DIAGNOSIS — E669 Obesity, unspecified: Secondary | ICD-10-CM | POA: Diagnosis not present

## 2019-05-10 NOTE — Progress Notes (Signed)
Office: [phone]  /  Fax: [phone] TeleHealth Visit:  Ashlee Swanson has verbally consented to this TeleHealth visit today. The patient is located at home, the provider is located at the [facility] office. The participants in this visit include the listed provider and patient. The visit was conducted today via FaceTime.  HPI:   Chief Complaint: OBESITY Ashlee Swanson is here to discuss her progress with her obesity treatment plan. She is on the Category 2 plan and journaling 400-500 calories + 35 grams of protein at supper and is following her eating plan approximately 70% of the time. She states she is exercising 0 minutes 0 times per week. Zali states she weighed 185 lbsa today reflecting weight maintenance, which she is satisfied with. Her goal weight is 170 lbs (29 BMI). She has limited her alcohol intake and has worked at increasing her protein. We were unable to weigh the patient today for this TeleHealth visit. She feels as if she has maintained her weight since her last visit. She has lost 14 lbs since starting treatment with [place].  Vitamin D deficiency Ashlee Swanson has a diagnosis of Vitamin D deficiency, which is not at goal. Her last Vitamin D level was reported to be 45.2 on 01/16/2019. She is currently taking prescription Vit D and denies nausea, vomiting or muscle weakness.  ASSESSMENT AND PLAN:  Vitamin D deficiency  Class 1 obesity with serious comorbidity and body mass index (BMI) of 30.0 to 30.9 in adult, unspecified obesity type  PLAN:  Vitamin D Deficiency Ashlee Swanson was informed that low Vitamin D levels contributes to fatigue and are associated with obesity, breast, and colon cancer. She agrees to continue taking prescription Vit D and will follow-up for routine testing of Vitamin D in 3-4 weeks. She was informed of the risk of over-replacement of Vitamin D and agrees to not increase her dose unless she discusses this with [place] first. Ashlee Swanson agrees to  follow-up with our clinic in 2-3 weeks.  Obesity Ashlee Swanson is currently in the action stage of change. As such, her goal is to continue with weight loss efforts. She has agreed to follow the Category 2 plan and journal 400-500 calories + 35 grams of protein at supper. Ashlee Swanson has been instructed to resume exercise at least 2-3 days per week for weight loss and overall health benefits. We discussed the following Behavioral Modification Strategies today: increasing lean protein intake, decrease liquid calories, and planning for success.  Ashlee Swanson has agreed to follow-up with our clinic in 2-3 weeks. She was informed of the importance of frequent follow-up visits to maximize her success with intensive lifestyle modifications for her multiple health conditions.  ALLERGIES: Allergies  Allergen Reactions  . Minocycline Hives  . Codeine Nausea Only    Abdominal pain, shakes  . Tetracycline Hives    MEDICATIONS: Current Outpatient Medications on File Prior to Visit  Medication Sig Dispense Refill  . Eszopiclone 3 MG TABS Take 1 tablet (3 mg total) by mouth at bedtime as needed. Take immediately before bedtime 30 tablet 0  . Ibuprofen-diphenhydrAMINE Cit (ADVIL PM PO) Take by mouth.    . polyethylene glycol (MIRALAX / GLYCOLAX) packet Take 17 g by mouth daily.    . Vitamin D, Ergocalciferol, (DRISDOL) 1.25 MG (50000 UT) CAPS capsule Take 1 capsule (50,000 Units total) by mouth every 7 (seven) days. 4 capsule 0   No current facility-administered medications on file prior to visit.     PAST MEDICAL HISTORY: Past Medical History:  Diagnosis Date  . Arthritis    In the Back  . Back pain   . Bunion   . DDD (degenerative disc disease), lumbar   . High cholesterol   . IBS (irritable bowel syndrome)   . Insomnia   . Kidney stone   . Lactose intolerance   . Snoring     PAST SURGICAL HISTORY: Past Surgical History:  Procedure Laterality Date  . BUNIONECTOMY Left 1999   modified mcbride  bunionectony    SOCIAL HISTORY: Social History   Tobacco Use  . Smoking status: Former Smoker    Packs/day: 0.25    Years: 10.00    Pack years: 2.50    Last attempt to quit: 05/29/2004    Years since quitting: 14.9  . Smokeless tobacco: Never Used  Substance Use Topics  . Alcohol use: Yes    Alcohol/week: 0.0 standard drinks    Comment: socially  . Drug use: No    FAMILY HISTORY: Family History  Problem Relation Age of Onset  . Colon cancer Paternal Aunt   . Breast cancer Maternal Aunt   . Heart disease Unknown   . Stroke Maternal Grandmother   . Hypertension Mother   . Hypertension Father   . Hypertension Brother   . Stroke Paternal Grandfather    ROS: Review of Systems  Gastrointestinal: Negative for nausea and vomiting.  Musculoskeletal:       Negative for muscle weakness.   PHYSICAL EXAM: Pt in no acute distress  RECENT LABS AND TESTS: BMET    Component Value Date/Time   NA 139 06/09/2018 0903   K 4.2 06/09/2018 0903   CL 105 06/09/2018 0903   CO2 27 06/09/2018 0903   GLUCOSE 90 06/09/2018 0903   BUN 13 06/09/2018 0903   CREATININE 0.88 06/09/2018 0903   CALCIUM 9.5 06/09/2018 0903   GFRNONAA 74 12/31/2008 1110   GFRAA 90 12/31/2008 1110   Lab Results  Component Value Date   HGBA1C 5.2 09/07/2018   Lab Results  Component Value Date   INSULIN 6.8 09/07/2018   CBC    Component Value Date/Time   WBC 7.9 06/09/2018 0903   RBC 4.11 06/09/2018 0903   HGB 14.2 06/09/2018 0903   HCT 40.7 06/09/2018 0903   PLT 353.0 06/09/2018 0903   MCV 99.1 06/09/2018 0903   MCHC 34.9 06/09/2018 0903   RDW 12.3 06/09/2018 0903   LYMPHSABS 2.7 06/09/2018 0903   MONOABS 0.6 06/09/2018 0903   EOSABS 0.1 06/09/2018 0903   BASOSABS 0.1 06/09/2018 0903   Iron/TIBC/Ferritin/ %Sat No results found for: IRON, TIBC, FERRITIN, IRONPCTSAT Lipid Panel     Component Value Date/Time   CHOL 186 01/16/2019 1317   TRIG 74 01/16/2019 1317   HDL 49 01/16/2019 1317    CHOLHDL 4 06/09/2018 0903   VLDL 19.4 06/09/2018 0903   LDLCALC 122 (H) 01/16/2019 1317   Hepatic Function Panel     Component Value Date/Time   PROT 7.0 06/09/2018 0903   ALBUMIN 4.3 06/09/2018 0903   AST 12 06/09/2018 0903   ALT 12 06/09/2018 0903   ALKPHOS 51 06/09/2018 0903   BILITOT 0.5 06/09/2018 0903   BILIDIR 0.1 12/31/2010 0933      Component Value Date/Time   TSH 2.04 06/09/2018 0903   TSH 1.68 06/03/2017 1044   TSH 1.57 05/29/2016 1036   Results for REHA, MARTINOVICH (MRN [id number]) as of 05/10/2019 14:31  Ref. Range 01/16/2019 13:17  Vitamin D, 25-Hydroxy Latest Ref Range: 30.0 -  100.0 ng/mL 45.2   I, Michaelene Song, am acting as Location manager for [employer], [employer].  I have reviewed the above documentation for accuracy and completeness, and I agree with the above.  - Kailah Pennel, FNP-C.

## 2019-05-11 ENCOUNTER — Encounter (INDEPENDENT_AMBULATORY_CARE_PROVIDER_SITE_OTHER): Payer: Self-pay | Admitting: Family Medicine

## 2019-06-01 ENCOUNTER — Ambulatory Visit (INDEPENDENT_AMBULATORY_CARE_PROVIDER_SITE_OTHER): Payer: 59 | Admitting: Family Medicine

## 2019-06-01 ENCOUNTER — Encounter (INDEPENDENT_AMBULATORY_CARE_PROVIDER_SITE_OTHER): Payer: Self-pay | Admitting: Family Medicine

## 2019-06-01 ENCOUNTER — Other Ambulatory Visit: Payer: Self-pay

## 2019-06-01 VITALS — BP 133/74 | HR 80 | Temp 99.0°F | Ht 64.0 in | Wt 183.0 lb

## 2019-06-01 DIAGNOSIS — E669 Obesity, unspecified: Secondary | ICD-10-CM | POA: Diagnosis not present

## 2019-06-01 DIAGNOSIS — E559 Vitamin D deficiency, unspecified: Secondary | ICD-10-CM | POA: Diagnosis not present

## 2019-06-01 DIAGNOSIS — E7849 Other hyperlipidemia: Secondary | ICD-10-CM

## 2019-06-01 DIAGNOSIS — Z6831 Body mass index (BMI) 31.0-31.9, adult: Secondary | ICD-10-CM

## 2019-06-01 DIAGNOSIS — E8881 Metabolic syndrome: Secondary | ICD-10-CM

## 2019-06-05 ENCOUNTER — Encounter (INDEPENDENT_AMBULATORY_CARE_PROVIDER_SITE_OTHER): Payer: Self-pay | Admitting: Family Medicine

## 2019-06-05 NOTE — Progress Notes (Signed)
Office: [phone]  /  Fax: [phone]   HPI:   Chief Complaint: OBESITY Ashlee Swanson is here to discuss her progress with her obesity treatment plan. She is on the Category 2 plan and journaling 400-500 calories + 35 grams of protein at supper and is following her eating plan approximately 65% of the time. She states she is exercising 0 minutes 0 times per week. Ashlee Swanson is down 2 lbs from her last at home weight. She has been using recipes from our handout and they turned out well.  She is meal planning more and has started yoga. She wants to snack more at night. She reports journaling mostly but went over on her extra calories some days.Marland Kitchen Her weight is 183 lb (83 kg) today and has had a weight gain of 3 lbs since her last in office visit. She has lost 11 lbs since starting treatment with [place].  Hyperlipidemia Ashlee Swanson has hyperlipidemia and is not on a statin. Her last LDL was high at 122 on 01/16/2019. She has been trying to improve her cholesterol levels with intensive lifestyle modification including a low saturated fat diet, exercise and weight loss. She denies any chest pain or shortness of breath. The 10-year ASCVD risk score Ashlee Swanson Ashlee Swanson., et al., 2013) is: 1.2%   Values used to calculate the score:     Age: 48 years     Sex: Female     Is Non-Hispanic African American: No     Diabetic: No     Tobacco smoker: No     Systolic Blood Pressure: 967 mmHg     Is BP treated: No     HDL Cholesterol: 49 mg/dL     Total Cholesterol: 186 mg/dL  Vitamin D deficiency Ashlee Swanson has a diagnosis of Vitamin D deficiency, which is nearly at goal. Her last Vitamin D level was reported to be 45.2 on 01/16/2019. She is currently taking prescription Vit D and denies nausea, vomiting or muscle weakness.  Insulin Resistance Ashlee Swanson has a diagnosis of insulin resistance based on her elevated fasting insulin level >5. Although Ashlee Swanson's blood glucose readings are still under good control, insulin resistance puts  her at greater risk of metabolic syndrome and diabetes. She is not taking metformin currently and continues to work on diet and exercise to decrease risk of diabetes. She does report polyphagia at night. We discussed metformin and she will research this.  ASSESSMENT AND PLAN:  Other hyperlipidemia - Plan: Lipid Panel With LDL/HDL Ratio  Vitamin D deficiency - Plan: VITAMIN D 25 Hydroxy (Vit-D Deficiency, Fractures)  Insulin resistance - Plan: Comprehensive metabolic panel, Hemoglobin A1c, Insulin, random, VITAMIN D 25 Hydroxy (Vit-D Deficiency, Fractures)  Class 1 obesity with serious comorbidity and body mass index (BMI) of 31.0 to 31.9 in adult, unspecified obesity type  PLAN:  Hyperlipidemia Ashlee Swanson was informed of the American Heart Association Guidelines emphasizing intensive lifestyle modifications as the first line treatment for hyperlipidemia. We discussed many lifestyle modifications today in depth, and [name] will continue to work on decreasing saturated fats such as fatty red meat, butter and many fried foods. She will also increase vegetables and lean protein in her diet and continue to work on exercise and weight loss efforts. [name] will have a fasting lipid panel checked today.  Vitamin D Deficiency Ashlee Swanson was informed that low Vitamin D levels contributes to fatigue and are associated with obesity, breast, and colon cancer. She will have routine testing of Vitamin D today. She was informed of the  risk of over-replacement of Vitamin D and agrees to not increase her dose unless she discusses this with [place] first. [name] agrees to follow-up with our clinic in 2-3 weeks.  Insulin Resistance Ashlee Swanson will continue to work on weight loss, exercise, and decreasing simple carbohydrates in her diet to help decrease the risk of diabetes. Ashlee Swanson will have fasting insulin/glucose and A1c levels checked today and will follow-up with [place] as directed to monitor her progress.  Obesity Ashlee Swanson is  currently in the action stage of change. As such, her goal is to continue with weight loss efforts. She has agreed to follow the Category 2 plan and journal 400-500 calories and 35+ grams of protein at supper. Ashlee Swanson has been instructed to do yoga once weekly and increase walking for weight loss and overall health benefits. We discussed the following Behavioral Modification Strategies today: decreasing simple carbohydrates, increase H20 intake, better snacking choices, and planning for success.  Ashlee Swanson has agreed to follow-up with our clinic in 2-3 weeks. She was informed of the importance of frequent follow-up visits to maximize her success with intensive lifestyle modifications for her multiple health conditions.  ALLERGIES: Allergies  Allergen Reactions  . Minocycline Hives  . Codeine Nausea Only    Abdominal pain, shakes  . Tetracycline Hives    MEDICATIONS: Current Outpatient Medications on File Prior to Visit  Medication Sig Dispense Refill  . Eszopiclone 3 MG TABS Take 1 tablet (3 mg total) by mouth at bedtime as needed. Take immediately before bedtime 30 tablet 0  . Ibuprofen-diphenhydrAMINE Cit (ADVIL PM PO) Take by mouth.    . polyethylene glycol (MIRALAX / GLYCOLAX) packet Take 17 g by mouth daily.    . Vitamin D, Ergocalciferol, (DRISDOL) 1.25 MG (50000 UT) CAPS capsule Take 1 capsule (50,000 Units total) by mouth every 7 (seven) days. 4 capsule 0   No current facility-administered medications on file prior to visit.     PAST MEDICAL HISTORY: Past Medical History:  Diagnosis Date  . Arthritis    In the Back  . Back pain   . Bunion   . DDD (degenerative disc disease), lumbar   . High cholesterol   . IBS (irritable bowel syndrome)   . Insomnia   . Kidney stone   . Lactose intolerance   . Snoring     PAST SURGICAL HISTORY: Past Surgical History:  Procedure Laterality Date  . BUNIONECTOMY Left 1999   modified mcbride bunionectony    SOCIAL HISTORY: Social  History   Tobacco Use  . Smoking status: Former Smoker    Packs/day: 0.25    Years: 10.00    Pack years: 2.50    Quit date: 05/29/2004    Years since quitting: 15.0  . Smokeless tobacco: Never Used  Substance Use Topics  . Alcohol use: Yes    Alcohol/week: 0.0 standard drinks    Comment: socially  . Drug use: No    FAMILY HISTORY: Family History  Problem Relation Age of Onset  . Colon cancer Paternal Aunt   . Breast cancer Maternal Aunt   . Heart disease Unknown   . Stroke Maternal Grandmother   . Hypertension Mother   . Hypertension Father   . Hypertension Brother   . Stroke Paternal Grandfather    ROS: Review of Systems  Respiratory: Negative for shortness of breath.   Cardiovascular: Negative for chest pain.  Gastrointestinal: Negative for nausea and vomiting.  Musculoskeletal:       Negative for muscle weakness.  Endo/Heme/Allergies:  Positive for polyphagia at night.   PHYSICAL EXAM: Blood pressure 133/74, pulse 80, temperature 99 F (37.2 C), height 5\' 4"  (1.626 m), weight 183 lb (83 kg), SpO2 97 %. Body mass index is 31.41 kg/m. Physical Exam Vitals signs reviewed.  Constitutional:      Appearance: Normal appearance. She is obese.  Cardiovascular:     Rate and Rhythm: Normal rate.     Pulses: Normal pulses.  Pulmonary:     Effort: Pulmonary effort is normal.     Breath sounds: Normal breath sounds.  Musculoskeletal: Normal range of motion.  Skin:    General: Skin is warm and dry.  Neurological:     Mental Status: She is alert and oriented to person, place, and time.  Psychiatric:        Behavior: Behavior normal.   RECENT LABS AND TESTS: BMET    Component Value Date/Time   NA 139 06/09/2018 0903   K 4.2 06/09/2018 0903   CL 105 06/09/2018 0903   CO2 27 06/09/2018 0903   GLUCOSE 90 06/09/2018 0903   BUN 13 06/09/2018 0903   CREATININE 0.88 06/09/2018 0903   CALCIUM 9.5 06/09/2018 0903   GFRNONAA 74 12/31/2008 1110   GFRAA 90  12/31/2008 1110   Lab Results  Component Value Date   HGBA1C 5.2 09/07/2018   Lab Results  Component Value Date   INSULIN 6.8 09/07/2018   CBC    Component Value Date/Time   WBC 7.9 06/09/2018 0903   RBC 4.11 06/09/2018 0903   HGB 14.2 06/09/2018 0903   HCT 40.7 06/09/2018 0903   PLT 353.0 06/09/2018 0903   MCV 99.1 06/09/2018 0903   MCHC 34.9 06/09/2018 0903   RDW 12.3 06/09/2018 0903   LYMPHSABS 2.7 06/09/2018 0903   MONOABS 0.6 06/09/2018 0903   EOSABS 0.1 06/09/2018 0903   BASOSABS 0.1 06/09/2018 0903   Iron/TIBC/Ferritin/ %Sat No results found for: IRON, TIBC, FERRITIN, IRONPCTSAT Lipid Panel     Component Value Date/Time   CHOL 186 01/16/2019 1317   TRIG 74 01/16/2019 1317   HDL 49 01/16/2019 1317   CHOLHDL 4 06/09/2018 0903   VLDL 19.4 06/09/2018 0903   LDLCALC 122 (H) 01/16/2019 1317   Hepatic Function Panel     Component Value Date/Time   PROT 7.0 06/09/2018 0903   ALBUMIN 4.3 06/09/2018 0903   AST 12 06/09/2018 0903   ALT 12 06/09/2018 0903   ALKPHOS 51 06/09/2018 0903   BILITOT 0.5 06/09/2018 0903   BILIDIR 0.1 12/31/2010 0933      Component Value Date/Time   TSH 2.04 06/09/2018 0903   TSH 1.68 06/03/2017 1044   TSH 1.57 05/29/2016 1036   Results for Ashlee Swanson, Ashlee Swanson (MRN [id number]) as of 06/05/2019 09:34  Ref. Range 01/16/2019 13:17  Vitamin D, 25-Hydroxy Latest Ref Range: 30.0 - 100.0 ng/mL 45.2    OBESITY BEHAVIORAL INTERVENTION VISIT  Today's visit was #16   Starting weight: 194 lbs Starting date: 09/07/2018 Today's weight: 183 lbs  Today's date: 06/01/2019 Total lbs lost to date: 70  ASK: We discussed the diagnosis of obesity with Ashlee Swanson today and Ashlee Swanson agreed to give [place] permission to discuss obesity behavioral modification therapy today.  ASSESS: Ashlee Swanson has the diagnosis of obesity and her BMI today is 31.4. Ashlee Swanson is in the action stage of change.   ADVISE: Ashlee Swanson was educated on the multiple health  risks of obesity as well as the benefit of weight loss to improve her health.  She was advised of the need for long term treatment and the importance of lifestyle modifications to improve her current health and to decrease her risk of future health problems.  AGREE: Multiple dietary modification options and treatment options were discussed and  Ashlee Swanson agreed to follow the recommendations documented in the above note.  ARRANGE: Ashlee Swanson was educated on the importance of frequent visits to treat obesity as outlined per CMS and USPSTF guidelines and agreed to schedule her next follow up appointment today.  IMichaelene Song, am acting as Location manager for [employer], FNP  I have reviewed the above documentation for accuracy and completeness, and I agree with the above.  - Marque Bango, FNP-C.

## 2019-06-12 ENCOUNTER — Other Ambulatory Visit: Payer: Self-pay

## 2019-06-12 ENCOUNTER — Ambulatory Visit (INDEPENDENT_AMBULATORY_CARE_PROVIDER_SITE_OTHER): Payer: 59 | Admitting: Family Medicine

## 2019-06-12 ENCOUNTER — Encounter: Payer: Self-pay | Admitting: Family Medicine

## 2019-06-12 DIAGNOSIS — Z23 Encounter for immunization: Secondary | ICD-10-CM | POA: Diagnosis not present

## 2019-06-12 DIAGNOSIS — L0291 Cutaneous abscess, unspecified: Secondary | ICD-10-CM | POA: Diagnosis not present

## 2019-06-12 DIAGNOSIS — R232 Flushing: Secondary | ICD-10-CM | POA: Insufficient documentation

## 2019-06-12 DIAGNOSIS — E785 Hyperlipidemia, unspecified: Secondary | ICD-10-CM | POA: Diagnosis not present

## 2019-06-12 NOTE — Addendum Note (Signed)
Addended by: Caffie Pinto on: 06/12/2019 02:28 PM   Modules accepted: Orders

## 2019-06-12 NOTE — Progress Notes (Signed)
Virtual Visit via Video Note  I connected with Ashlee Swanson on 06/12/19 at  8:30 AM EDT by a video enabled telemedicine application and verified that I am speaking with the correct person using two identifiers.  Location: Patient: home  Provider: home    I discussed the limitations of evaluation and management by telemedicine and the availability of in person appointments. The patient expressed understanding and agreed to proceed.  History of Present Illness: Pt is home and needs f/u cholesterol and weight.   Pt has complaint of abscess on her buttocks--- she states it has surronding errythema-- she does not think it is urgent but thinks it needs to be lanced She also needs a tetanus  Pt has no other complaints.     Observations/Objective:Marland Kitchen Vitals:   06/12/19 0819  BP: 133/74  Pulse: 87  pt is in NAD    Assessment and Plan: 1. Morbid obesity ([place]) con't healthy weight and wellness Check labs con't exercise  - Insulin, random; Future - Vitamin D (25 hydroxy); Future - Hemoglobin A1c; Future  2. Hyperlipidemia, unspecified hyperlipidemia type Encouraged heart healthy diet, increase exercise, avoid trans fats, consider a krill oil cap daily - Lipid panel; Future - Comprehensive metabolic panel; Future  3. Abscess Warm soaks Pt will make app with derm -- she feels like it needs to be lanced She knows she can see [place] or go to UC as well   4. Need for tetanus booster Will do at [facility]   5. Hot flashes Pt will try otc --- estrovan / black cohosh She has app with gyn coming up as well -- pt has mirena in   Follow Up Instructions:    I discussed the assessment and treatment plan with the patient. The patient was provided an opportunity to ask questions and all were answered. The patient agreed with the plan and demonstrated an understanding of the instructions.   The patient was advised to call back or seek an in-person evaluation if the symptoms worsen or if the  condition fails to improve as anticipated.  I provided 25 minutes of non-face-to-face time during this encounter.   Ann Held, DO

## 2019-06-13 LAB — COMPREHENSIVE METABOLIC PANEL
ALT: 9 U/L (ref 0–35)
AST: 12 U/L (ref 0–37)
Albumin: 4.1 g/dL (ref 3.5–5.2)
Alkaline Phosphatase: 60 U/L (ref 39–117)
BUN: 14 mg/dL (ref 6–23)
CO2: 29 mEq/L (ref 19–32)
Calcium: 9.6 mg/dL (ref 8.4–10.5)
Chloride: 103 mEq/L (ref 96–112)
Creatinine, Ser: 0.84 mg/dL (ref 0.40–1.20)
GFR: 72.18 mL/min (ref >60.00)
Glucose, Bld: 82 mg/dL (ref 70–99)
Potassium: 4.5 mEq/L (ref 3.5–5.1)
Sodium: 139 mEq/L (ref 135–145)
Total Bilirubin: 0.4 mg/dL (ref 0.2–1.2)
Total Protein: 6.6 g/dL (ref 6.0–8.3)

## 2019-06-13 LAB — LIPID PANEL
Cholesterol: 160 mg/dL (ref 0–200)
HDL: 41.4 mg/dL (ref >39.00)
LDL Cholesterol: 98 mg/dL (ref 0–99)
NonHDL: 118.87
Total CHOL/HDL Ratio: 4
Triglycerides: 105 mg/dL (ref 0.0–149.0)
VLDL: 21 mg/dL (ref 0.0–40.0)

## 2019-06-13 LAB — VITAMIN D 25 HYDROXY (VIT D DEFICIENCY, FRACTURES): VITD: 60.25 ng/mL (ref 30.00–100.00)

## 2019-06-13 LAB — HEMOGLOBIN A1C: Hgb A1c MFr Bld: 5.2 % (ref 4.6–6.5)

## 2019-06-13 LAB — INSULIN, RANDOM: Insulin: 6.4 u[IU]/mL

## 2019-06-15 ENCOUNTER — Ambulatory Visit (INDEPENDENT_AMBULATORY_CARE_PROVIDER_SITE_OTHER): Payer: 59 | Admitting: Family Medicine

## 2019-06-15 ENCOUNTER — Encounter (INDEPENDENT_AMBULATORY_CARE_PROVIDER_SITE_OTHER): Payer: Self-pay | Admitting: Family Medicine

## 2019-06-15 ENCOUNTER — Other Ambulatory Visit: Payer: Self-pay

## 2019-06-15 VITALS — BP 121/79 | HR 79 | Temp 98.1°F | Ht 64.0 in | Wt 183.0 lb

## 2019-06-15 DIAGNOSIS — Z9189 Other specified personal risk factors, not elsewhere classified: Secondary | ICD-10-CM | POA: Diagnosis not present

## 2019-06-15 DIAGNOSIS — E669 Obesity, unspecified: Secondary | ICD-10-CM

## 2019-06-15 DIAGNOSIS — Z6831 Body mass index (BMI) 31.0-31.9, adult: Secondary | ICD-10-CM

## 2019-06-15 DIAGNOSIS — E785 Hyperlipidemia, unspecified: Secondary | ICD-10-CM

## 2019-06-15 DIAGNOSIS — E559 Vitamin D deficiency, unspecified: Secondary | ICD-10-CM

## 2019-06-15 MED ORDER — ERGOCALCIFEROL 50 MCG (2000 UT) PO TABS: 1.0000 | ORAL_TABLET | Freq: Every day | ORAL | Status: DC

## 2019-06-19 NOTE — Progress Notes (Signed)
Office: [phone]  /  Fax: [phone]   HPI:   Chief Complaint: OBESITY Ashlee Swanson is here to discuss her progress with her obesity treatment plan. She is on the keep a food journal with 400-500 calories and 35+ grams of protein at supper daily and follow the Category 2 plan and is following her eating plan approximately 75 % of the time. She states she is exercising 0 minutes 0 times per week. Ashlee Swanson only did 1 yoga session the last few weeks. She did do [employer] for dinner occasionally. She saw Dr. Etter Sjogren for possible abscess. She is still experiencing night time cravings and struggles with falling asleep. She denies occasional actual hunger at night.  Her weight is 183 lb (83 kg) today and has not lost weight since her last visit. She has lost 11 lbs since starting treatment with [place].  Vitamin D Deficiency Ashlee Swanson has a diagnosis of vitamin D deficiency. She is currently taking prescription Vit D. She notes fatigue and denies nausea, vomiting or muscle weakness.  At risk for osteopenia and osteoporosis Ashlee Swanson is at higher risk of osteopenia and osteoporosis due to vitamin D deficiency.   Hyperlipidemia Ashlee Swanson has hyperlipidemia and has been trying to improve her cholesterol levels with intensive lifestyle modification including a low saturated fat diet, exercise and weight loss. Last LDL was at 98 on labs on 06/12/2019, and HDL was below goal. She denies any chest pain, claudication or myalgias.  ASSESSMENT AND PLAN:  Hyperlipidemia, unspecified hyperlipidemia type  Vitamin D deficiency  At risk for osteoporosis  Class 1 obesity with serious comorbidity and body mass index (BMI) of 31.0 to 31.9 in adult, unspecified obesity type  PLAN:  Vitamin D Deficiency Ashlee Swanson was informed that low vitamin D levels contributes to fatigue and are associated with obesity, breast, and colon cancer. Ashlee Swanson agrees to stop prescription Vit D and switch to OTC Vit D 2,000 IU daily. She will  follow up for routine testing of vitamin D, at least 2-3 times per year. She was informed of the risk of over-replacement of vitamin D and agrees to not increase her dose unless she discusses this with [place] first. Ashlee Swanson agrees to follow up with our clinic in 2 weeks.  At risk for osteopenia and osteoporosis Ashlee Swanson was given extended (15 minutes) osteoporosis prevention counseling today. Ashlee Swanson is at risk for osteopenia and osteoporsis due to her vitamin D deficiency. She was encouraged to take her vitamin D and follow her higher calcium diet and increase strengthening exercise to help strengthen her bones and decrease her risk of osteopenia and osteoporosis.  Hyperlipidemia Ashlee Swanson was informed of the American Heart Association Guidelines emphasizing intensive lifestyle modifications as the first line treatment for hyperlipidemia. We discussed many lifestyle modifications today in depth, and Ashlee Swanson will continue to work on decreasing saturated fats such as fatty red meat, butter and many fried foods. She will also increase vegetables and lean protein in her diet and continue to work on exercise and weight loss efforts. We will repeat FLP in 3 months. Ashlee Swanson agrees to follow up with our clinic in 2 weeks.  Obesity Ashlee Swanson is currently in the action stage of change. As such, her goal is to continue with weight loss efforts She has agreed to follow the Category 2 plan Ashlee Swanson has been instructed to work up to a goal of 150 minutes of combined cardio and strengthening exercise per week for weight loss and overall health benefits. We discussed the following Behavioral Modification Strategies  today: increasing lean protein intake, increasing vegetables and work on meal planning and easy cooking plans, keeping healthy foods in the home, and planning for success   Ashlee Swanson has agreed to follow up with our clinic in 2 weeks. She was informed of the importance of frequent follow up visits to maximize her success  with intensive lifestyle modifications for her multiple health conditions.  ALLERGIES: Allergies  Allergen Reactions  . Minocycline Hives  . Codeine Nausea Only    Abdominal pain, shakes  . Tetracycline Hives    MEDICATIONS: Current Outpatient Medications on File Prior to Visit  Medication Sig Dispense Refill  . Eszopiclone 3 MG TABS Take 1 tablet (3 mg total) by mouth at bedtime as needed. Take immediately before bedtime 30 tablet 0  . Ibuprofen-diphenhydrAMINE Cit (ADVIL PM PO) Take by mouth.    . polyethylene glycol (MIRALAX / GLYCOLAX) packet Take 17 g by mouth daily.     No current facility-administered medications on file prior to visit.     PAST MEDICAL HISTORY: Past Medical History:  Diagnosis Date  . Arthritis    In the Back  . Back pain   . Bunion   . DDD (degenerative disc disease), lumbar   . High cholesterol   . IBS (irritable bowel syndrome)   . Insomnia   . Kidney stone   . Lactose intolerance   . Snoring     PAST SURGICAL HISTORY: Past Surgical History:  Procedure Laterality Date  . BUNIONECTOMY Left 1999   modified mcbride bunionectony    SOCIAL HISTORY: Social History   Tobacco Use  . Smoking status: Former Smoker    Packs/day: 0.25    Years: 10.00    Pack years: 2.50    Quit date: 05/29/2004    Years since quitting: 15.0  . Smokeless tobacco: Never Used  Substance Use Topics  . Alcohol use: Yes    Alcohol/week: 0.0 standard drinks    Comment: socially  . Drug use: No    FAMILY HISTORY: Family History  Problem Relation Age of Onset  . Colon cancer Paternal Aunt   . Breast cancer Maternal Aunt   . Heart disease Unknown   . Stroke Maternal Grandmother   . Hypertension Mother   . Hypertension Father   . Hypertension Brother   . Stroke Paternal Grandfather     ROS: Review of Systems  Constitutional: Positive for malaise/fatigue. Negative for weight loss.  Cardiovascular: Negative for chest pain and claudication.   Gastrointestinal: Negative for nausea and vomiting.  Musculoskeletal: Negative for myalgias.       Negative muscle weakness    PHYSICAL EXAM: Blood pressure 121/79, pulse 79, temperature 98.1 F (36.7 C), height 5\' 4"  (1.626 m), weight 183 lb (83 kg), SpO2 98 %. Body mass index is 31.41 kg/m. Physical Exam Vitals signs reviewed.  Constitutional:      Appearance: Normal appearance. She is obese.  Cardiovascular:     Rate and Rhythm: Normal rate.     Pulses: Normal pulses.  Pulmonary:     Effort: Pulmonary effort is normal.     Breath sounds: Normal breath sounds.  Musculoskeletal: Normal range of motion.  Skin:    General: Skin is warm and dry.  Neurological:     Mental Status: She is alert and oriented to person, place, and time.  Psychiatric:        Mood and Affect: Mood normal.        Behavior: Behavior normal.  RECENT LABS AND TESTS: BMET    Component Value Date/Time   NA 139 06/12/2019 1429   K 4.5 06/12/2019 1429   CL 103 06/12/2019 1429   CO2 29 06/12/2019 1429   GLUCOSE 82 06/12/2019 1429   BUN 14 06/12/2019 1429   CREATININE 0.84 06/12/2019 1429   CALCIUM 9.6 06/12/2019 1429   GFRNONAA 74 12/31/2008 1110   GFRAA 90 12/31/2008 1110   Lab Results  Component Value Date   HGBA1C 5.2 06/12/2019   HGBA1C 5.2 09/07/2018   Lab Results  Component Value Date   INSULIN 6.8 09/07/2018   CBC    Component Value Date/Time   WBC 7.9 06/09/2018 0903   RBC 4.11 06/09/2018 0903   HGB 14.2 06/09/2018 0903   HCT 40.7 06/09/2018 0903   PLT 353.0 06/09/2018 0903   MCV 99.1 06/09/2018 0903   MCHC 34.9 06/09/2018 0903   RDW 12.3 06/09/2018 0903   LYMPHSABS 2.7 06/09/2018 0903   MONOABS 0.6 06/09/2018 0903   EOSABS 0.1 06/09/2018 0903   BASOSABS 0.1 06/09/2018 0903   Iron/TIBC/Ferritin/ %Sat No results found for: IRON, TIBC, FERRITIN, IRONPCTSAT Lipid Panel     Component Value Date/Time   CHOL 160 06/12/2019 1429   CHOL 186 01/16/2019 1317   TRIG 105.0  06/12/2019 1429   HDL 41.40 06/12/2019 1429   HDL 49 01/16/2019 1317   CHOLHDL 4 06/12/2019 1429   VLDL 21.0 06/12/2019 1429   LDLCALC 98 06/12/2019 1429   LDLCALC 122 (H) 01/16/2019 1317   Hepatic Function Panel     Component Value Date/Time   PROT 6.6 06/12/2019 1429   ALBUMIN 4.1 06/12/2019 1429   AST 12 06/12/2019 1429   ALT 9 06/12/2019 1429   ALKPHOS 60 06/12/2019 1429   BILITOT 0.4 06/12/2019 1429   BILIDIR 0.1 12/31/2010 0933      Component Value Date/Time   TSH 2.04 06/09/2018 0903   TSH 1.68 06/03/2017 1044   TSH 1.57 05/29/2016 1036      OBESITY BEHAVIORAL INTERVENTION VISIT  Today's visit was # 17   Starting weight: 194 lbs Starting date: 09/07/18 Today's weight : 183 lbs  Today's date: 06/15/2019 Total lbs lost to date: 11    ASK: We discussed the diagnosis of obesity with Prudence Davidson today and Rechelle agreed to give [place] permission to discuss obesity behavioral modification therapy today.  ASSESS: Ted has the diagnosis of obesity and her BMI today is 31.4 Sibley is in the action stage of change   ADVISE: Delcenia was educated on the multiple health risks of obesity as well as the benefit of weight loss to improve her health. She was advised of the need for long term treatment and the importance of lifestyle modifications to improve her current health and to decrease her risk of future health problems.  AGREE: Multiple dietary modification options and treatment options were discussed and  Michele agreed to follow the recommendations documented in the above note.  ARRANGE: Adella was educated on the importance of frequent visits to treat obesity as outlined per CMS and USPSTF guidelines and agreed to schedule her next follow up appointment today.  I, Trixie Dredge, am acting as transcriptionist for Ilene Qua, MD  I have reviewed the above documentation for accuracy and completeness, and I agree with the above. - Ilene Qua,  MD

## 2019-07-10 ENCOUNTER — Other Ambulatory Visit: Payer: Self-pay

## 2019-07-10 ENCOUNTER — Encounter (INDEPENDENT_AMBULATORY_CARE_PROVIDER_SITE_OTHER): Payer: Self-pay | Admitting: Family Medicine

## 2019-07-10 ENCOUNTER — Ambulatory Visit (INDEPENDENT_AMBULATORY_CARE_PROVIDER_SITE_OTHER): Payer: 59 | Admitting: Family Medicine

## 2019-07-10 VITALS — BP 107/68 | HR 80 | Temp 98.1°F | Ht 64.0 in | Wt 184.0 lb

## 2019-07-10 DIAGNOSIS — E559 Vitamin D deficiency, unspecified: Secondary | ICD-10-CM

## 2019-07-10 DIAGNOSIS — E669 Obesity, unspecified: Secondary | ICD-10-CM

## 2019-07-10 DIAGNOSIS — E8881 Metabolic syndrome: Secondary | ICD-10-CM

## 2019-07-10 DIAGNOSIS — Z6831 Body mass index (BMI) 31.0-31.9, adult: Secondary | ICD-10-CM | POA: Diagnosis not present

## 2019-07-11 NOTE — Progress Notes (Signed)
Office: [phone]  /  Fax: [phone]   HPI:   Chief Complaint: OBESITY Ashlee Swanson is here to discuss her progress with her obesity treatment plan. She is on the Category 2 plan and is following her eating plan approximately 65 to 70 % of the time. She states she is doing yoga 60 minutes 2 times per week. Ashlee Swanson feels the last few weeks were difficult to adhere to the plan, secondary to a trip to the beach. Ashlee Swanson worked a significant amount the last few weeks, and then she was off on vacation. Her weight is 184 lb (83.5 kg) today and has had a weight gain of 1 pound over a period of 3 weeks since her last visit. She has lost 10 lbs since starting treatment with [place].  Insulin Resistance Ashlee Swanson has a diagnosis of insulin resistance based on her elevated fasting insulin level >5. Although Ashlee Swanson's blood glucose readings are still under good control, insulin resistance puts her at greater risk of metabolic syndrome and diabetes. Her last labs were stable from her prior labs. She is not taking metformin currently and continues to work on diet and exercise to decrease risk of diabetes. Ashlee Swanson admits to carb cravings.  Vitamin D deficiency Ashlee Swanson has a diagnosis of vitamin D deficiency. Ashlee Swanson has transitioned to daily OTC vitamin D. She is on 2,000 IU daily. Ashlee Swanson denies nausea, vomiting or muscle weakness.  ASSESSMENT AND PLAN:  Insulin resistance  Vitamin D deficiency  Class 1 obesity with serious comorbidity and body mass index (BMI) of 31.0 to 31.9 in adult, unspecified obesity type  PLAN:  Insulin Resistance Ashlee Swanson will continue to work on weight loss, exercise, and decreasing simple carbohydrates in her diet to help decrease the risk of diabetes. She was informed that eating too many simple carbohydrates or too many calories at one sitting increases the likelihood of GI side effects. Ashlee Swanson will continue with the meal plan (Category 2) and we will repeat labs in October. Ashlee Swanson  agreed to follow up with [place] as directed to monitor her progress.  Vitamin D Deficiency Ashlee Swanson was informed that low vitamin D levels contributes to fatigue and are associated with obesity, breast, and colon cancer. She will continue to take daily OTC Vit D and will follow up for routine testing of vitamin D, at least 2-3 times per year. She was informed of the risk of over-replacement of vitamin D and agrees to not increase her dose unless she discusses this with [place] first. We will repeat labs in October.  Obesity Ashlee Swanson is currently in the action stage of change. As such, her goal is to continue with weight loss efforts She has agreed to follow the Category 2 plan +100 calories Ashlee Swanson has been instructed to work up to a goal of 150 minutes of combined cardio and strengthening exercise per week for weight loss and overall health benefits. We discussed the following Behavioral Modification Strategies today: planning for success, keeping healthy foods in the home, better snacking choices, increasing lean protein intake, increasing vegetables and work on meal planning and easy cooking plans  Ashlee Swanson has agreed to follow up with our clinic in 2 weeks. She was informed of the importance of frequent follow up visits to maximize her success with intensive lifestyle modifications for her multiple health conditions.  ALLERGIES: Allergies  Allergen Reactions  . Minocycline Hives  . Codeine Nausea Only    Abdominal pain, shakes  . Tetracycline Hives    MEDICATIONS: Current Outpatient Medications on File  Prior to Visit  Medication Sig Dispense Refill  . Ergocalciferol 50 MCG (2000 UT) TABS Take 1 tablet by mouth daily. 30 tablet   . Eszopiclone 3 MG TABS Take 1 tablet (3 mg total) by mouth at bedtime as needed. Take immediately before bedtime 30 tablet 0  . Ibuprofen-diphenhydrAMINE Cit (ADVIL PM PO) Take by mouth.    . polyethylene glycol (MIRALAX / GLYCOLAX) packet Take 17 g by mouth daily.     No  current facility-administered medications on file prior to visit.     PAST MEDICAL HISTORY: Past Medical History:  Diagnosis Date  . Arthritis    In the Back  . Back pain   . Bunion   . DDD (degenerative disc disease), lumbar   . High cholesterol   . IBS (irritable bowel syndrome)   . Insomnia   . Kidney stone   . Lactose intolerance   . Snoring     PAST SURGICAL HISTORY: Past Surgical History:  Procedure Laterality Date  . BUNIONECTOMY Left 1999   modified mcbride bunionectony    SOCIAL HISTORY: Social History   Tobacco Use  . Smoking status: Former Smoker    Packs/day: 0.25    Years: 10.00    Pack years: 2.50    Quit date: 05/29/2004    Years since quitting: 15.1  . Smokeless tobacco: Never Used  Substance Use Topics  . Alcohol use: Yes    Alcohol/week: 0.0 standard drinks    Comment: socially  . Drug use: No    FAMILY HISTORY: Family History  Problem Relation Age of Onset  . Colon cancer Paternal Aunt   . Breast cancer Maternal Aunt   . Heart disease Unknown   . Stroke Maternal Grandmother   . Hypertension Mother   . Hypertension Father   . Hypertension Brother   . Stroke Paternal Grandfather     ROS: Review of Systems  Constitutional: Negative for weight loss.  Gastrointestinal: Negative for nausea and vomiting.  Musculoskeletal:       Negative for muscle weakness  Endo/Heme/Allergies:       Positive for carb cravings    PHYSICAL EXAM: Blood pressure 107/68, pulse 80, temperature 98.1 F (36.7 C), temperature source Oral, height 5\' 4"  (1.626 m), weight 184 lb (83.5 kg), SpO2 93 %. Body mass index is 31.58 kg/m. Physical Exam  RECENT LABS AND TESTS: BMET    Component Value Date/Time   NA 139 06/12/2019 1429   K 4.5 06/12/2019 1429   CL 103 06/12/2019 1429   CO2 29 06/12/2019 1429   GLUCOSE 82 06/12/2019 1429   BUN 14 06/12/2019 1429   CREATININE 0.84 06/12/2019 1429   CALCIUM 9.6 06/12/2019 1429   GFRNONAA 74 12/31/2008 1110    GFRAA 90 12/31/2008 1110   Lab Results  Component Value Date   HGBA1C 5.2 06/12/2019   HGBA1C 5.2 09/07/2018   Lab Results  Component Value Date   INSULIN 6.8 09/07/2018   CBC    Component Value Date/Time   WBC 7.9 06/09/2018 0903   RBC 4.11 06/09/2018 0903   HGB 14.2 06/09/2018 0903   HCT 40.7 06/09/2018 0903   PLT 353.0 06/09/2018 0903   MCV 99.1 06/09/2018 0903   MCHC 34.9 06/09/2018 0903   RDW 12.3 06/09/2018 0903   LYMPHSABS 2.7 06/09/2018 0903   MONOABS 0.6 06/09/2018 0903   EOSABS 0.1 06/09/2018 0903   BASOSABS 0.1 06/09/2018 0903   Iron/TIBC/Ferritin/ %Sat No results found for: IRON, TIBC, FERRITIN, IRONPCTSAT  Lipid Panel     Component Value Date/Time   CHOL 160 06/12/2019 1429   CHOL 186 01/16/2019 1317   TRIG 105.0 06/12/2019 1429   HDL 41.40 06/12/2019 1429   HDL 49 01/16/2019 1317   CHOLHDL 4 06/12/2019 1429   VLDL 21.0 06/12/2019 1429   LDLCALC 98 06/12/2019 1429   LDLCALC 122 (H) 01/16/2019 1317   Hepatic Function Panel     Component Value Date/Time   PROT 6.6 06/12/2019 1429   ALBUMIN 4.1 06/12/2019 1429   AST 12 06/12/2019 1429   ALT 9 06/12/2019 1429   ALKPHOS 60 06/12/2019 1429   BILITOT 0.4 06/12/2019 1429   BILIDIR 0.1 12/31/2010 0933      Component Value Date/Time   TSH 2.04 06/09/2018 0903   TSH 1.68 06/03/2017 1044   TSH 1.57 05/29/2016 1036     Ref. Range 06/12/2019 14:29  VITD Latest Ref Range: 30.00 - 100.00 ng/mL 60.25    OBESITY BEHAVIORAL INTERVENTION VISIT  Today's visit was # 18   Starting weight: 194 lbs Starting date: 09/07/2018 Today's weight : 184 lbs Today's date: 07/10/2019 Total lbs lost to date: 10    07/10/2019  Height 5\' 4"  (1.626 m)  Weight 184 lb (83.5 kg)  BMI (Calculated) 31.57  BLOOD PRESSURE - SYSTOLIC 128  BLOOD PRESSURE - DIASTOLIC 68   Body Fat % 38 %  Total Body Water (lbs) 74.6 lbs    ASK: We discussed the diagnosis of obesity with Ashlee Swanson today and Ashlee Swanson agreed to give  [place] permission to discuss obesity behavioral modification therapy today.  ASSESS: Ashlee Swanson has the diagnosis of obesity and her BMI today is 31.57 Ashlee Swanson is in the action stage of change   ADVISE: Nitasha was educated on the multiple health risks of obesity as well as the benefit of weight loss to improve her health. She was advised of the need for long term treatment and the importance of lifestyle modifications to improve her current health and to decrease her risk of future health problems.  AGREE: Multiple dietary modification options and treatment options were discussed and  Ashlee Swanson agreed to follow the recommendations documented in the above note.  ARRANGE: Ashlee Swanson was educated on the importance of frequent visits to treat obesity as outlined per CMS and USPSTF guidelines and agreed to schedule her next follow up appointment today.  I, Doreene Nest, am acting as transcriptionist for Eber Jones, MD  I have reviewed the above documentation for accuracy and completeness, and I agree with the above. - Ilene Qua, MD

## 2019-07-24 ENCOUNTER — Ambulatory Visit (INDEPENDENT_AMBULATORY_CARE_PROVIDER_SITE_OTHER): Payer: 59 | Admitting: Family Medicine

## 2019-07-26 ENCOUNTER — Encounter (INDEPENDENT_AMBULATORY_CARE_PROVIDER_SITE_OTHER): Payer: Self-pay | Admitting: Family Medicine

## 2019-07-26 ENCOUNTER — Ambulatory Visit (INDEPENDENT_AMBULATORY_CARE_PROVIDER_SITE_OTHER): Payer: 59 | Admitting: Family Medicine

## 2019-07-26 ENCOUNTER — Other Ambulatory Visit: Payer: Self-pay

## 2019-07-26 VITALS — BP 108/71 | HR 79 | Temp 98.6°F | Ht 64.0 in | Wt 184.0 lb

## 2019-07-26 DIAGNOSIS — R0602 Shortness of breath: Secondary | ICD-10-CM | POA: Diagnosis not present

## 2019-07-26 DIAGNOSIS — E669 Obesity, unspecified: Secondary | ICD-10-CM

## 2019-07-26 DIAGNOSIS — Z6831 Body mass index (BMI) 31.0-31.9, adult: Secondary | ICD-10-CM

## 2019-07-26 DIAGNOSIS — Z9189 Other specified personal risk factors, not elsewhere classified: Secondary | ICD-10-CM

## 2019-07-26 DIAGNOSIS — E7849 Other hyperlipidemia: Secondary | ICD-10-CM

## 2019-07-27 NOTE — Progress Notes (Signed)
Office: [phone]  /  Fax: [phone]   HPI:   Chief Complaint: OBESITY Ashlee Swanson is here to discuss her progress with her obesity treatment plan. She is on the Category 2 plan and is following her eating plan approximately 60 % of the time. She states she is doing yoga 60 minutes 1 time per week. Ashlee Swanson had feelings of PMS for approximately one week, during the last two weeks; mostly the second week. Patient voiced that she kept going over her calories, especially at dinner. Philis varied the protein amount for dinner. Her weight is 184 lb (83.5 kg) today and she has maintained weight since her last visit. She has lost 10 lbs since starting treatment with [place].  Dyspnea with Exercise Ashlee Swanson is still having shortness of breath on exertion. She has had minimal change from her initial visit. We will order indirect calorimetry today.  Hyperlipidemia Ashlee Swanson has hyperlipidemia and she has been trying to improve her cholesterol levels with intensive lifestyle modification including a low saturated fat diet, exercise and weight loss. Ashlee Swanson had LDL of 122 on 06/12/19. She is not on statin. She denies any chest pain or myalgias.  At risk for cardiovascular disease Ashlee Swanson is at a higher than average risk for cardiovascular disease due to obesity and hyperlipidemia. She currently denies any chest pain.  ASSESSMENT AND PLAN:  SOB (shortness of breath) on exertion  Other hyperlipidemia  At risk for heart disease  Class 1 obesity with serious comorbidity and body mass index (BMI) of 31.0 to 31.9 in adult, unspecified obesity type  PLAN:  Dyspnea with exercise The indirect calorimeter results showed VO2 of 211 and a REE of 1470. She will continue to work on weight loss and gradually increase exercise to treat her exercise induced shortness of breath.  Hyperlipidemia Ashlee Swanson was informed of the American Heart Association Guidelines emphasizing intensive lifestyle modifications as the first  line treatment for hyperlipidemia. We discussed many lifestyle modifications today in depth, and Ashlee Swanson will continue to work on decreasing saturated fats such as fatty red meat, butter and many fried foods. She will also increase vegetables and lean protein in her diet and continue to work on exercise and weight loss efforts. We will repeat labs in early October.  Cardiovascular risk counseling Ashlee Swanson was given extended (15 minutes) coronary artery disease prevention counseling today. She is 48 y.o. female and has risk factors for heart disease including obesity and hyperlipidemia. We discussed intensive lifestyle modifications today with an emphasis on specific weight loss instructions and strategies. Pt was also informed of the importance of increasing exercise and decreasing saturated fats to help prevent heart disease.  Obesity Ashlee Swanson is currently in the action stage of change. As such, her goal is to continue with weight loss efforts She has agreed to follow the Category 2 plan Ashlee Swanson is to increase the amount of physical activity to yoga 2 times per week and Ashlee Swanson 1 time per week  for weight loss and overall health benefits. We discussed the following Behavioral Modification Strategies today: planning for success, keeping healthy foods in the home, increasing lean protein intake, increasing vegetables and work on meal planning and easy cooking plans  Ashlee Swanson has agreed to follow up with our clinic in 2 weeks. She was informed of the importance of frequent follow up visits to maximize her success with intensive lifestyle modifications for her multiple health conditions.  ALLERGIES: Allergies  Allergen Reactions   Minocycline Hives   Codeine Nausea Only  Abdominal pain, shakes   Tetracycline Hives    MEDICATIONS: Current Outpatient Medications on File Prior to Visit  Medication Sig Dispense Refill   Ergocalciferol 50 MCG (2000 UT) TABS Take 1 tablet by mouth daily. 30 tablet     Eszopiclone 3 MG TABS Take 1 tablet (3 mg total) by mouth at bedtime as needed. Take immediately before bedtime 30 tablet 0   Ibuprofen-diphenhydrAMINE Cit (ADVIL PM PO) Take by mouth.     polyethylene glycol (MIRALAX / GLYCOLAX) packet Take 17 g by mouth daily.     No current facility-administered medications on file prior to visit.     PAST MEDICAL HISTORY: Past Medical History:  Diagnosis Date   Arthritis    In the Back   Back pain    Bunion    DDD (degenerative disc disease), lumbar    High cholesterol    IBS (irritable bowel syndrome)    Insomnia    Kidney stone    Lactose intolerance    Snoring     PAST SURGICAL HISTORY: Past Surgical History:  Procedure Laterality Date   BUNIONECTOMY Left 1999   modified mcbride bunionectony    SOCIAL HISTORY: Social History   Tobacco Use   Smoking status: Former Smoker    Packs/day: 0.25    Years: 10.00    Pack years: 2.50    Quit date: 05/29/2004    Years since quitting: 15.1   Smokeless tobacco: Never Used  Substance Use Topics   Alcohol use: Yes    Alcohol/week: 0.0 standard drinks    Comment: socially   Drug use: No    FAMILY HISTORY: Family History  Problem Relation Age of Onset   Colon cancer Paternal 18    Breast cancer Maternal Aunt    Heart disease Unknown    Stroke Maternal Grandmother    Hypertension Mother    Hypertension Father    Hypertension Brother    Stroke Paternal Grandfather     ROS: Review of Systems  Constitutional: Negative for weight loss.  Respiratory: Positive for shortness of breath (on exertion).   Cardiovascular: Negative for chest pain.  Musculoskeletal: Negative for myalgias.    PHYSICAL EXAM: Blood pressure 108/71, pulse 79, temperature 98.6 F (37 C), temperature source Oral, height 5\' 4"  (1.626 m), weight 184 lb (83.5 kg), SpO2 95 %. Body mass index is 31.58 kg/m. Physical Exam Vitals signs reviewed.  Constitutional:      Appearance: Normal  appearance. She is well-developed. She is obese.  Cardiovascular:     Rate and Rhythm: Normal rate.  Pulmonary:     Effort: Pulmonary effort is normal.  Musculoskeletal: Normal range of motion.  Skin:    General: Skin is warm and dry.  Neurological:     Mental Status: She is alert and oriented to person, place, and time.  Psychiatric:        Mood and Affect: Mood normal.        Behavior: Behavior normal.     RECENT LABS AND TESTS: BMET    Component Value Date/Time   NA 139 06/12/2019 1429   K 4.5 06/12/2019 1429   CL 103 06/12/2019 1429   CO2 29 06/12/2019 1429   GLUCOSE 82 06/12/2019 1429   BUN 14 06/12/2019 1429   CREATININE 0.84 06/12/2019 1429   CALCIUM 9.6 06/12/2019 1429   GFRNONAA 74 12/31/2008 1110   GFRAA 90 12/31/2008 1110   Lab Results  Component Value Date   HGBA1C 5.2 06/12/2019  HGBA1C 5.2 09/07/2018   Lab Results  Component Value Date   INSULIN 6.8 09/07/2018   CBC    Component Value Date/Time   WBC 7.9 06/09/2018 0903   RBC 4.11 06/09/2018 0903   HGB 14.2 06/09/2018 0903   HCT 40.7 06/09/2018 0903   PLT 353.0 06/09/2018 0903   MCV 99.1 06/09/2018 0903   MCHC 34.9 06/09/2018 0903   RDW 12.3 06/09/2018 0903   LYMPHSABS 2.7 06/09/2018 0903   MONOABS 0.6 06/09/2018 0903   EOSABS 0.1 06/09/2018 0903   BASOSABS 0.1 06/09/2018 0903   Iron/TIBC/Ferritin/ %Sat No results found for: IRON, TIBC, FERRITIN, IRONPCTSAT Lipid Panel     Component Value Date/Time   CHOL 160 06/12/2019 1429   CHOL 186 01/16/2019 1317   TRIG 105.0 06/12/2019 1429   HDL 41.40 06/12/2019 1429   HDL 49 01/16/2019 1317   CHOLHDL 4 06/12/2019 1429   VLDL 21.0 06/12/2019 1429   LDLCALC 98 06/12/2019 1429   LDLCALC 122 (H) 01/16/2019 1317   Hepatic Function Panel     Component Value Date/Time   PROT 6.6 06/12/2019 1429   ALBUMIN 4.1 06/12/2019 1429   AST 12 06/12/2019 1429   ALT 9 06/12/2019 1429   ALKPHOS 60 06/12/2019 1429   BILITOT 0.4 06/12/2019 1429    BILIDIR 0.1 12/31/2010 0933      Component Value Date/Time   TSH 2.04 06/09/2018 0903   TSH 1.68 06/03/2017 1044   TSH 1.57 05/29/2016 1036      Ref. Range 01/16/2019 13:17  Vitamin D, 25-Hydroxy Latest Ref Range: 30.0 - 100.0 ng/mL 45.2    OBESITY BEHAVIORAL INTERVENTION VISIT  Today's visit was # 19   Starting weight: 194 lbs Starting date: 09/07/2018 Today's weight : 184 lbs Today's date: 07/26/2019 Total lbs lost to date: 10    07/26/2019  Height 5\' 4"  (1.626 m)  Weight 184 lb (83.5 kg)  BMI (Calculated) 31.57  BLOOD PRESSURE - SYSTOLIC 027  BLOOD PRESSURE - DIASTOLIC 71   Body Fat % 74.1 %  Total Body Water (lbs) 76.6 lbs  RMR 1470    ASK: We discussed the diagnosis of obesity with Prudence Davidson today and Alyssa agreed to give [place] permission to discuss obesity behavioral modification therapy today.  ASSESS: Floris has the diagnosis of obesity and her BMI today is 31.57 Mayci is in the action stage of change   ADVISE: Deneane was educated on the multiple health risks of obesity as well as the benefit of weight loss to improve her health. She was advised of the need for long term treatment and the importance of lifestyle modifications to improve her current health and to decrease her risk of future health problems.  AGREE: Multiple dietary modification options and treatment options were discussed and  Correne agreed to follow the recommendations documented in the above note.  ARRANGE: Kess was educated on the importance of frequent visits to treat obesity as outlined per CMS and USPSTF guidelines and agreed to schedule her next follow up appointment today.  I, Ashlee Swanson, am acting as transcriptionist for Eber Jones, MD  I have reviewed the above documentation for accuracy and completeness, and I agree with the above. - Ashlee Qua, MD

## 2019-08-09 ENCOUNTER — Ambulatory Visit (INDEPENDENT_AMBULATORY_CARE_PROVIDER_SITE_OTHER): Payer: 59 | Admitting: Family Medicine

## 2019-08-09 ENCOUNTER — Other Ambulatory Visit: Payer: Self-pay

## 2019-08-09 ENCOUNTER — Encounter (INDEPENDENT_AMBULATORY_CARE_PROVIDER_SITE_OTHER): Payer: Self-pay | Admitting: Family Medicine

## 2019-08-09 VITALS — BP 118/74 | HR 72 | Temp 98.5°F | Ht 64.0 in | Wt 183.0 lb

## 2019-08-09 DIAGNOSIS — E8881 Metabolic syndrome: Secondary | ICD-10-CM

## 2019-08-09 DIAGNOSIS — E559 Vitamin D deficiency, unspecified: Secondary | ICD-10-CM

## 2019-08-09 DIAGNOSIS — E669 Obesity, unspecified: Secondary | ICD-10-CM | POA: Diagnosis not present

## 2019-08-09 DIAGNOSIS — Z6831 Body mass index (BMI) 31.0-31.9, adult: Secondary | ICD-10-CM | POA: Diagnosis not present

## 2019-08-15 NOTE — Progress Notes (Signed)
Office: [phone]  /  Fax: [phone]   HPI:   Chief Complaint: OBESITY Ashlee Swanson is here to discuss her progress with her obesity treatment plan. She is on the Category 2 plan and is following her eating plan approximately 70 % of the time. She states she is doing yoga for 60 minutes 2 times per week. Ashlee Swanson voices the time she isn't following the plan, she does struggle with binge type behavior. She may eat a sleeve of crackers or eat an extra slice of pizza. She is doing kodiak oatmeal in the morning instead of breakfast options.  Her weight is 183 lb (83 kg) today and has had a weight loss of 1 pound over a period of 2 weeks since her last visit. She has lost 11 lbs since starting treatment with [place].  Insulin Resistance Ashlee Swanson has a diagnosis of insulin resistance based on her elevated fasting insulin level >5. Last insulin was of 6.4 and Hgb A1c of 5.2. Although Ashlee Swanson's blood glucose readings are still under good control, insulin resistance puts her at greater risk of metabolic syndrome and diabetes. She is not taking metformin currently and continues to work on diet and exercise to decrease risk of diabetes.  Vitamin D Deficiency Ashlee Swanson has a diagnosis of vitamin D deficiency. She is currently taking OTC Vit D. She notes fatigue and denies nausea, vomiting or muscle weakness.  ASSESSMENT AND PLAN:  Insulin resistance  Vitamin D deficiency  Class 1 obesity with serious comorbidity and body mass index (BMI) of 31.0 to 31.9 in adult, unspecified obesity type  PLAN:  Insulin Resistance Ashlee Swanson will continue to work on weight loss, exercise, and decreasing simple carbohydrates in her diet to help decrease the risk of diabetes. We dicussed metformin including benefits and risks. She was informed that eating too many simple carbohydrates or too many calories at one sitting increases the likelihood of GI side effects. Ashlee Swanson declined metformin for now and prescription was not written  today. We will retest labs in October. Ashlee Swanson agrees to follow up with our clinic in 2 weeks as directed to monitor her progress.  Vitamin D Deficiency Ashlee Swanson was informed that low vitamin D levels contributes to fatigue and are associated with obesity, breast, and colon cancer. Ashlee Swanson agrees to continue taking OTC Vit D 2,000 IU daily. She will follow up for routine testing of vitamin D, at least 2-3 times per year. She was informed of the risk of over-replacement of vitamin D and agrees to not increase her dose unless she discusses this with [place] first. Ashlee Swanson agrees to follow up with our clinic in 2 weeks.  I spent > than 50% of the 15 minute visit on counseling as documented in the note.  Obesity Ashlee Swanson is currently in the action stage of change. As such, her goal is to continue with weight loss efforts She has agreed to follow the Category 2 plan Ashlee Swanson has been instructed to work up to a goal of 150 minutes of combined cardio and strengthening exercise per week for weight loss and overall health benefits. We discussed the following Behavioral Modification Strategies today: increasing lean protein intake, increasing vegetables and work on meal planning and easy cooking plans, keeping healthy foods in the home, better snacking choices, and planning for success   Ashlee Swanson has agreed to follow up with our clinic in 2 weeks. She was informed of the importance of frequent follow up visits to maximize her success with intensive lifestyle modifications for her multiple health  conditions.  ALLERGIES: Allergies  Allergen Reactions  . Minocycline Hives  . Codeine Nausea Only    Abdominal pain, shakes  . Tetracycline Hives    MEDICATIONS: Current Outpatient Medications on File Prior to Visit  Medication Sig Dispense Refill  . Ergocalciferol 50 MCG (2000 UT) TABS Take 1 tablet by mouth daily. 30 tablet   . Eszopiclone 3 MG TABS Take 1 tablet (3 mg total) by mouth at bedtime as needed. Take  immediately before bedtime 30 tablet 0  . Ibuprofen-diphenhydrAMINE Cit (ADVIL PM PO) Take by mouth.    . polyethylene glycol (MIRALAX / GLYCOLAX) packet Take 17 g by mouth daily.     No current facility-administered medications on file prior to visit.     PAST MEDICAL HISTORY: Past Medical History:  Diagnosis Date  . Arthritis    In the Back  . Back pain   . Bunion   . DDD (degenerative disc disease), lumbar   . High cholesterol   . IBS (irritable bowel syndrome)   . Insomnia   . Kidney stone   . Lactose intolerance   . Snoring     PAST SURGICAL HISTORY: Past Surgical History:  Procedure Laterality Date  . BUNIONECTOMY Left 1999   modified mcbride bunionectony    SOCIAL HISTORY: Social History   Tobacco Use  . Smoking status: Former Smoker    Packs/day: 0.25    Years: 10.00    Pack years: 2.50    Quit date: 05/29/2004    Years since quitting: 15.2  . Smokeless tobacco: Never Used  Substance Use Topics  . Alcohol use: Yes    Alcohol/week: 0.0 standard drinks    Comment: socially  . Drug use: No    FAMILY HISTORY: Family History  Problem Relation Age of Onset  . Colon cancer Paternal Aunt   . Breast cancer Maternal Aunt   . Heart disease Unknown   . Stroke Maternal Grandmother   . Hypertension Mother   . Hypertension Father   . Hypertension Brother   . Stroke Paternal Grandfather     ROS: Review of Systems  Constitutional: Positive for malaise/fatigue and weight loss.  Gastrointestinal: Negative for nausea and vomiting.  Musculoskeletal:       Negative muscle weakness    PHYSICAL EXAM: Blood pressure 118/74, pulse 72, temperature 98.5 F (36.9 C), temperature source Oral, height 5\' 4"  (1.626 m), weight 183 lb (83 kg), SpO2 94 %. Body mass index is 31.41 kg/m. Physical Exam Vitals signs reviewed.  Constitutional:      Appearance: Normal appearance. She is obese.  Cardiovascular:     Rate and Rhythm: Normal rate.     Pulses: Normal pulses.   Pulmonary:     Effort: Pulmonary effort is normal.     Breath sounds: Normal breath sounds.  Musculoskeletal: Normal range of motion.  Skin:    General: Skin is warm and dry.  Neurological:     Mental Status: She is alert and oriented to person, place, and time.  Psychiatric:        Mood and Affect: Mood normal.        Behavior: Behavior normal.     RECENT LABS AND TESTS: BMET    Component Value Date/Time   NA 139 06/12/2019 1429   K 4.5 06/12/2019 1429   CL 103 06/12/2019 1429   CO2 29 06/12/2019 1429   GLUCOSE 82 06/12/2019 1429   BUN 14 06/12/2019 1429   CREATININE 0.84 06/12/2019 1429  CALCIUM 9.6 06/12/2019 1429   GFRNONAA 74 12/31/2008 1110   GFRAA 90 12/31/2008 1110   Lab Results  Component Value Date   HGBA1C 5.2 06/12/2019   HGBA1C 5.2 09/07/2018   Lab Results  Component Value Date   INSULIN 6.8 09/07/2018   CBC    Component Value Date/Time   WBC 7.9 06/09/2018 0903   RBC 4.11 06/09/2018 0903   HGB 14.2 06/09/2018 0903   HCT 40.7 06/09/2018 0903   PLT 353.0 06/09/2018 0903   MCV 99.1 06/09/2018 0903   MCHC 34.9 06/09/2018 0903   RDW 12.3 06/09/2018 0903   LYMPHSABS 2.7 06/09/2018 0903   MONOABS 0.6 06/09/2018 0903   EOSABS 0.1 06/09/2018 0903   BASOSABS 0.1 06/09/2018 0903   Iron/TIBC/Ferritin/ %Sat No results found for: IRON, TIBC, FERRITIN, IRONPCTSAT Lipid Panel     Component Value Date/Time   CHOL 160 06/12/2019 1429   CHOL 186 01/16/2019 1317   TRIG 105.0 06/12/2019 1429   HDL 41.40 06/12/2019 1429   HDL 49 01/16/2019 1317   CHOLHDL 4 06/12/2019 1429   VLDL 21.0 06/12/2019 1429   LDLCALC 98 06/12/2019 1429   LDLCALC 122 (H) 01/16/2019 1317   Hepatic Function Panel     Component Value Date/Time   PROT 6.6 06/12/2019 1429   ALBUMIN 4.1 06/12/2019 1429   AST 12 06/12/2019 1429   ALT 9 06/12/2019 1429   ALKPHOS 60 06/12/2019 1429   BILITOT 0.4 06/12/2019 1429   BILIDIR 0.1 12/31/2010 0933      Component Value Date/Time    TSH 2.04 06/09/2018 0903   TSH 1.68 06/03/2017 1044   TSH 1.57 05/29/2016 1036      OBESITY BEHAVIORAL INTERVENTION VISIT  Today's visit was # 20   Starting weight: 194 lbs Starting date: 09/07/18 Today's weight : 183 lbs Today's date: 08/09/2019 Total lbs lost to date: 11    ASK: We discussed the diagnosis of obesity with Prudence Davidson today and Elinor agreed to give [place] permission to discuss obesity behavioral modification therapy today.  ASSESS: Paisley has the diagnosis of obesity and her BMI today is 31.4 Laia is in the action stage of change   ADVISE: Seneca was educated on the multiple health risks of obesity as well as the benefit of weight loss to improve her health. She was advised of the need for long term treatment and the importance of lifestyle modifications to improve her current health and to decrease her risk of future health problems.  AGREE: Multiple dietary modification options and treatment options were discussed and  Pieper agreed to follow the recommendations documented in the above note.  ARRANGE: Pinki was educated on the importance of frequent visits to treat obesity as outlined per CMS and USPSTF guidelines and agreed to schedule her next follow up appointment today.  I, Trixie Dredge, am acting as transcriptionist for Ilene Qua, MD  I have reviewed the above documentation for accuracy and completeness, and I agree with the above. - Ilene Qua, MD

## 2019-08-22 ENCOUNTER — Ambulatory Visit (INDEPENDENT_AMBULATORY_CARE_PROVIDER_SITE_OTHER): Payer: 59 | Admitting: Family Medicine

## 2019-08-22 ENCOUNTER — Encounter (INDEPENDENT_AMBULATORY_CARE_PROVIDER_SITE_OTHER): Payer: Self-pay | Admitting: Family Medicine

## 2019-08-22 ENCOUNTER — Other Ambulatory Visit: Payer: Self-pay

## 2019-08-22 VITALS — BP 123/79 | HR 87 | Temp 98.2°F | Ht 64.0 in | Wt 182.0 lb

## 2019-08-22 DIAGNOSIS — Z6831 Body mass index (BMI) 31.0-31.9, adult: Secondary | ICD-10-CM

## 2019-08-22 DIAGNOSIS — E8881 Metabolic syndrome: Secondary | ICD-10-CM | POA: Diagnosis not present

## 2019-08-22 DIAGNOSIS — E7849 Other hyperlipidemia: Secondary | ICD-10-CM | POA: Diagnosis not present

## 2019-08-22 DIAGNOSIS — E669 Obesity, unspecified: Secondary | ICD-10-CM | POA: Diagnosis not present

## 2019-08-22 NOTE — Progress Notes (Signed)
Office: [phone]  /  Fax: [phone]   HPI:   Chief Complaint: OBESITY Ashlee Swanson is here to discuss her progress with her obesity treatment plan. She is on the Category 2 plan and is following her eating plan approximately 75 % of the time. She states she is doing yoga for 60 minutes 2 times per week. Ashlee Swanson bought a new scale and still found a 4 lb weight discrepancy between her scale and our scale. She is sleeping better at night and stuck to the plan a little more stringently. She has no plans for Labor Day weekend. She is going to [place] next week and is planning a 2 week quarantine.  Her weight is 182 lb (82.6 kg) today and has had a weight loss of 1 pounds over a period of 2 weeks since her last visit. She has lost 12 lbs since starting treatment with [place].  Hyperlipidemia Ashlee Swanson has hyperlipidemia and has been trying to improve her cholesterol levels with intensive lifestyle modification including a low saturated fat diet, exercise and weight loss. Last LDL was of 98 (on 06/12/2019) and HDL of 41.4. She is not on statin and denies any chest pain, claudication or myalgias.  Insulin Resistance Ashlee Swanson has a diagnosis of insulin resistance based on her elevated fasting insulin level >5. Last insulin was of 6.4 and Hgb A1c of 5.2. Although Ashlee Swanson's blood glucose readings are still under good control, insulin resistance puts her at greater risk of metabolic syndrome and diabetes. She is not on any medications and notes minimal occasional carbohydrate cravings. She continues to work on diet and exercise to decrease risk of diabetes.  ASSESSMENT AND PLAN:  Other hyperlipidemia  Insulin resistance  Class 1 obesity with serious comorbidity and body mass index (BMI) of 31.0 to 31.9 in adult, unspecified obesity type  PLAN:  Hyperlipidemia Ashlee Swanson was informed of the American Heart Association Guidelines emphasizing intensive lifestyle modifications as the first line treatment for  hyperlipidemia. We discussed many lifestyle modifications today in depth, and Ashlee Swanson will continue to work on decreasing saturated fats such as fatty red meat, butter and many fried foods. She will also increase vegetables and lean protein in her diet and continue to work on exercise and weight loss efforts. We will repeat labs in mid October. Ashlee Swanson agrees to follow up with our clinic in 2 to 3 weeks.  Insulin Resistance Ashlee Swanson will continue to work on weight loss, exercise, and decreasing simple carbohydrates in her diet to help decrease the risk of diabetes. We dicussed metformin including benefits and risks. She was informed that eating too many simple carbohydrates or too many calories at one sitting increases the likelihood of GI side effects. Ashlee Swanson declined metformin for now and prescription was not written today. We will repeat labs in mid October. Ashlee Swanson agrees to follow up with our clinic in 2 to 3 weeks as directed to monitor her progress.  I spent > than 50% of the 15 minute visit on counseling as documented in the note.  Obesity Ashlee Swanson is currently in the action stage of change. As such, her goal is to continue with weight loss efforts She has agreed to keep a food journal with 1200-1300 calories and 85+ grams of protein daily or follow the Category 2 plan Ashlee Swanson has been instructed to work up to a goal of 150 minutes of combined cardio and strengthening exercise per week for weight loss and overall health benefits. We discussed the following Behavioral Modification Strategies today: increasing lean  protein intake, increasing vegetables and work on meal planning and easy cooking plans, keeping healthy foods in the home, and planning for success   Ashlee Swanson has agreed to follow up with our clinic in 2 to 3 weeks. She was informed of the importance of frequent follow up visits to maximize her success with intensive lifestyle modifications for her multiple health  conditions.  ALLERGIES: Allergies  Allergen Reactions   Minocycline Hives   Codeine Nausea Only    Abdominal pain, shakes   Tetracycline Hives    MEDICATIONS: Current Outpatient Medications on File Prior to Visit  Medication Sig Dispense Refill   Ergocalciferol 50 MCG (2000 UT) TABS Take 1 tablet by mouth daily. 30 tablet    Eszopiclone 3 MG TABS Take 1 tablet (3 mg total) by mouth at bedtime as needed. Take immediately before bedtime 30 tablet 0   Ibuprofen-diphenhydrAMINE Cit (ADVIL PM PO) Take by mouth.     polyethylene glycol (MIRALAX / GLYCOLAX) packet Take 17 g by mouth daily.     No current facility-administered medications on file prior to visit.     PAST MEDICAL HISTORY: Past Medical History:  Diagnosis Date   Arthritis    In the Back   Back pain    Bunion    DDD (degenerative disc disease), lumbar    High cholesterol    IBS (irritable bowel syndrome)    Insomnia    Kidney stone    Lactose intolerance    Snoring     PAST SURGICAL HISTORY: Past Surgical History:  Procedure Laterality Date   BUNIONECTOMY Left 1999   modified mcbride bunionectony    SOCIAL HISTORY: Social History   Tobacco Use   Smoking status: Former Smoker    Packs/day: 0.25    Years: 10.00    Pack years: 2.50    Quit date: 05/29/2004    Years since quitting: 15.2   Smokeless tobacco: Never Used  Substance Use Topics   Alcohol use: Yes    Alcohol/week: 0.0 standard drinks    Comment: socially   Drug use: No    FAMILY HISTORY: Family History  Problem Relation Age of Onset   Colon cancer Paternal 55    Breast cancer Maternal Aunt    Heart disease Unknown    Stroke Maternal Grandmother    Hypertension Mother    Hypertension Father    Hypertension Brother    Stroke Paternal Grandfather     ROS: Review of Systems  Constitutional: Positive for weight loss.  Cardiovascular: Negative for chest pain and claudication.  Musculoskeletal:  Negative for myalgias.    PHYSICAL EXAM: Blood pressure 123/79, pulse 87, temperature 98.2 F (36.8 C), temperature source Oral, height 5\' 4"  (1.626 m), weight 182 lb (82.6 kg), SpO2 96 %. Body mass index is 31.24 kg/m. Physical Exam Vitals signs reviewed.  Constitutional:      Appearance: Normal appearance. She is obese.  Cardiovascular:     Rate and Rhythm: Normal rate.     Pulses: Normal pulses.  Pulmonary:     Effort: Pulmonary effort is normal.     Breath sounds: Normal breath sounds.  Musculoskeletal: Normal range of motion.  Skin:    General: Skin is warm and dry.  Neurological:     Mental Status: She is alert and oriented to person, place, and time.  Psychiatric:        Mood and Affect: Mood normal.        Behavior: Behavior normal.  RECENT LABS AND TESTS: BMET    Component Value Date/Time   NA 139 06/12/2019 1429   K 4.5 06/12/2019 1429   CL 103 06/12/2019 1429   CO2 29 06/12/2019 1429   GLUCOSE 82 06/12/2019 1429   BUN 14 06/12/2019 1429   CREATININE 0.84 06/12/2019 1429   CALCIUM 9.6 06/12/2019 1429   GFRNONAA 74 12/31/2008 1110   GFRAA 90 12/31/2008 1110   Lab Results  Component Value Date   HGBA1C 5.2 06/12/2019   HGBA1C 5.2 09/07/2018   Lab Results  Component Value Date   INSULIN 6.8 09/07/2018   CBC    Component Value Date/Time   WBC 7.9 06/09/2018 0903   RBC 4.11 06/09/2018 0903   HGB 14.2 06/09/2018 0903   HCT 40.7 06/09/2018 0903   PLT 353.0 06/09/2018 0903   MCV 99.1 06/09/2018 0903   MCHC 34.9 06/09/2018 0903   RDW 12.3 06/09/2018 0903   LYMPHSABS 2.7 06/09/2018 0903   MONOABS 0.6 06/09/2018 0903   EOSABS 0.1 06/09/2018 0903   BASOSABS 0.1 06/09/2018 0903   Iron/TIBC/Ferritin/ %Sat No results found for: IRON, TIBC, FERRITIN, IRONPCTSAT Lipid Panel     Component Value Date/Time   CHOL 160 06/12/2019 1429   CHOL 186 01/16/2019 1317   TRIG 105.0 06/12/2019 1429   HDL 41.40 06/12/2019 1429   HDL 49 01/16/2019 1317    CHOLHDL 4 06/12/2019 1429   VLDL 21.0 06/12/2019 1429   LDLCALC 98 06/12/2019 1429   LDLCALC 122 (H) 01/16/2019 1317   Hepatic Function Panel     Component Value Date/Time   PROT 6.6 06/12/2019 1429   ALBUMIN 4.1 06/12/2019 1429   AST 12 06/12/2019 1429   ALT 9 06/12/2019 1429   ALKPHOS 60 06/12/2019 1429   BILITOT 0.4 06/12/2019 1429   BILIDIR 0.1 12/31/2010 0933      Component Value Date/Time   TSH 2.04 06/09/2018 0903   TSH 1.68 06/03/2017 1044   TSH 1.57 05/29/2016 1036      OBESITY BEHAVIORAL INTERVENTION VISIT  Today's visit was # 21   Starting weight: 194 lbs Starting date: 09/07/18 Today's weight : 182 lbs Today's date: 08/22/2019 Total lbs lost to date: 12    ASK: We discussed the diagnosis of obesity with Ashlee Swanson today and Ashlee Swanson agreed to give [place] permission to discuss obesity behavioral modification therapy today.  ASSESS: Ashlee Swanson has the diagnosis of obesity and her BMI today is 31.22 Ashlee Swanson is in the action stage of change   ADVISE: Ashlee Swanson was educated on the multiple health risks of obesity as well as the benefit of weight loss to improve her health. She was advised of the need for long term treatment and the importance of lifestyle modifications to improve her current health and to decrease her risk of future health problems.  AGREE: Multiple dietary modification options and treatment options were discussed and  Ashlee Swanson agreed to follow the recommendations documented in the above note.  ARRANGE: Ashlee Swanson was educated on the importance of frequent visits to treat obesity as outlined per CMS and USPSTF guidelines and agreed to schedule her next follow up appointment today.  I, Trixie Dredge, am acting as transcriptionist for Ilene Qua, MD  I have reviewed the above documentation for accuracy and completeness, and I agree with the above. - Ilene Qua, MD

## 2019-08-23 ENCOUNTER — Other Ambulatory Visit: Payer: Self-pay | Admitting: Family Medicine

## 2019-08-24 MED ORDER — ESZOPICLONE 3 MG PO TABS: 3.0000 mg | ORAL_TABLET | Freq: Every evening | ORAL | 0 refills | Status: DC | PRN

## 2019-08-24 NOTE — Telephone Encounter (Signed)
Lunesta refill.   Last OV: 06/12/2019 Last Fill: 05/02/2019 #30 and 0RF UDS: 06/09/2018 Low risk

## 2019-09-14 ENCOUNTER — Telehealth (INDEPENDENT_AMBULATORY_CARE_PROVIDER_SITE_OTHER): Payer: 59 | Admitting: Family Medicine

## 2019-09-14 ENCOUNTER — Encounter (INDEPENDENT_AMBULATORY_CARE_PROVIDER_SITE_OTHER): Payer: Self-pay | Admitting: Family Medicine

## 2019-09-14 ENCOUNTER — Other Ambulatory Visit: Payer: Self-pay

## 2019-09-14 DIAGNOSIS — E7849 Other hyperlipidemia: Secondary | ICD-10-CM

## 2019-09-14 DIAGNOSIS — Z6830 Body mass index (BMI) 30.0-30.9, adult: Secondary | ICD-10-CM | POA: Diagnosis not present

## 2019-09-14 DIAGNOSIS — E559 Vitamin D deficiency, unspecified: Secondary | ICD-10-CM

## 2019-09-14 DIAGNOSIS — E669 Obesity, unspecified: Secondary | ICD-10-CM

## 2019-09-18 NOTE — Progress Notes (Signed)
Office: [phone]  /  Fax: [phone] TeleHealth Visit:  Ashlee Swanson has verbally consented to this TeleHealth visit today. The patient is located at home, the provider is located at the [facility] office. The participants in this visit include the listed provider and patient. The visit was conducted today via webex.  HPI:   Chief Complaint: OBESITY Ashlee Swanson is here to discuss her progress with her obesity treatment plan. She is on the keep a food journal with 1200-1300 calories and 85+ grams of protein daily or follow the Category 2 plan and is following her eating plan approximately 70 % of the time. She states she is doing yoga for 60 minutes 2 times per weeks, and walking for 3 hours 3 times per week. Ashlee Swanson had a trip to [place] for a long weekend. She denies issues with sticking to the meal plan. She did maybe drink a bit more than she expected. Her weight was of 184 lbs (which is a loss of 1-2 lbs from last appointment).  We were unable to weigh the patient today for this TeleHealth visit. She feels as if she has lost 1-2 lbs since her last visit. She has lost 12 lbs since starting treatment with [place].  Hyperlipidemia Ashlee Swanson has hyperlipidemia and has been trying to improve her cholesterol levels with intensive lifestyle modification including a low saturated fat diet, exercise and weight loss. Last LDL was of 98 recently and HDL of 41.4. She is not on statin and denies any chest pain, claudication or myalgias.  Vitamin D Deficiency Ashlee Swanson has a diagnosis of vitamin D deficiency. She is currently taking OTC Vit D. She notes fatigue and denies nausea, vomiting or muscle weakness.  ASSESSMENT AND PLAN:  Other hyperlipidemia  Vitamin D deficiency  Class 1 obesity with serious comorbidity and body mass index (BMI) of 30.0 to 30.9 in adult, unspecified obesity type  PLAN:  Hyperlipidemia Ashlee Swanson was informed of the American Heart Association Guidelines  emphasizing intensive lifestyle modifications as the first line treatment for hyperlipidemia. We discussed many lifestyle modifications today in depth, and Ashlee Swanson will continue to work on decreasing saturated fats such as fatty red meat, butter and many fried foods. She will also increase vegetables and lean protein in her diet and continue to work on exercise and weight loss efforts. We will repeat labs at her next appointment.  Vitamin D Deficiency Ashlee Swanson was informed that low vitamin D levels contributes to fatigue and are associated with obesity, breast, and colon cancer. Ashlee Swanson agrees to continue taking OTC Vit D and will follow up for routine testing of vitamin D, at least 2-3 times per year. She was informed of the risk of over-replacement of vitamin D and agrees to not increase her dose unless she discusses this with [place] first. We will repeat labs at her next appointment. Ashlee Swanson agrees to follow up with our clinic in 2 weeks.  Obesity Ashlee Swanson is currently in the action stage of change. As such, her goal is to continue with weight loss efforts She has agreed to follow the Category 2 plan Ashlee Swanson has been instructed to work up to a goal of 150 minutes of combined cardio and strengthening exercise per week for weight loss and overall health benefits. We discussed the following Behavioral Modification Strategies today: increasing lean protein intake, increasing vegetables and work on meal planning and easy cooking plans, keeping healthy foods in the home, and planning for success   Ashlee Swanson has agreed to follow up  with our clinic in 2 weeks. She was informed of the importance of frequent follow up visits to maximize her success with intensive lifestyle modifications for her multiple health conditions.  ALLERGIES: Allergies  Allergen Reactions  . Minocycline Hives  . Codeine Nausea Only    Abdominal pain, shakes  . Tetracycline Hives    MEDICATIONS: Current Outpatient Medications on File Prior  to Visit  Medication Sig Dispense Refill  . Ergocalciferol 50 MCG (2000 UT) TABS Take 1 tablet by mouth daily. 30 tablet   . Eszopiclone 3 MG TABS Take 1 tablet (3 mg total) by mouth at bedtime as needed. Take immediately before bedtime 30 tablet 0  . Ibuprofen-diphenhydrAMINE Cit (ADVIL PM PO) Take by mouth.    . polyethylene glycol (MIRALAX / GLYCOLAX) packet Take 17 g by mouth daily.     No current facility-administered medications on file prior to visit.     PAST MEDICAL HISTORY: Past Medical History:  Diagnosis Date  . Arthritis    In the Back  . Back pain   . Bunion   . DDD (degenerative disc disease), lumbar   . High cholesterol   . IBS (irritable bowel syndrome)   . Insomnia   . Kidney stone   . Lactose intolerance   . Snoring     PAST SURGICAL HISTORY: Past Surgical History:  Procedure Laterality Date  . BUNIONECTOMY Left 1999   modified mcbride bunionectony    SOCIAL HISTORY: Social History   Tobacco Use  . Smoking status: Former Smoker    Packs/day: 0.25    Years: 10.00    Pack years: 2.50    Quit date: 05/29/2004    Years since quitting: 15.3  . Smokeless tobacco: Never Used  Substance Use Topics  . Alcohol use: Yes    Alcohol/week: 0.0 standard drinks    Comment: socially  . Drug use: No    FAMILY HISTORY: Family History  Problem Relation Age of Onset  . Colon cancer Paternal Aunt   . Breast cancer Maternal Aunt   . Heart disease Unknown   . Stroke Maternal Grandmother   . Hypertension Mother   . Hypertension Father   . Hypertension Brother   . Stroke Paternal Grandfather     ROS: Review of Systems  Constitutional: Positive for malaise/fatigue and weight loss.  Cardiovascular: Negative for chest pain and claudication.  Gastrointestinal: Negative for nausea and vomiting.  Musculoskeletal: Negative for myalgias.       Negative muscle weakness    PHYSICAL EXAM: Pt in no acute distress  RECENT LABS AND TESTS: BMET    Component  Value Date/Time   NA 139 06/12/2019 1429   K 4.5 06/12/2019 1429   CL 103 06/12/2019 1429   CO2 29 06/12/2019 1429   GLUCOSE 82 06/12/2019 1429   BUN 14 06/12/2019 1429   CREATININE 0.84 06/12/2019 1429   CALCIUM 9.6 06/12/2019 1429   GFRNONAA 74 12/31/2008 1110   GFRAA 90 12/31/2008 1110   Lab Results  Component Value Date   HGBA1C 5.2 06/12/2019   HGBA1C 5.2 09/07/2018   Lab Results  Component Value Date   INSULIN 6.8 09/07/2018   CBC    Component Value Date/Time   WBC 7.9 06/09/2018 0903   RBC 4.11 06/09/2018 0903   HGB 14.2 06/09/2018 0903   HCT 40.7 06/09/2018 0903   PLT 353.0 06/09/2018 0903   MCV 99.1 06/09/2018 0903   MCHC 34.9 06/09/2018 0903   RDW 12.3 06/09/2018 0903  LYMPHSABS 2.7 06/09/2018 0903   MONOABS 0.6 06/09/2018 0903   EOSABS 0.1 06/09/2018 0903   BASOSABS 0.1 06/09/2018 0903   Iron/TIBC/Ferritin/ %Sat No results found for: IRON, TIBC, FERRITIN, IRONPCTSAT Lipid Panel     Component Value Date/Time   CHOL 160 06/12/2019 1429   CHOL 186 01/16/2019 1317   TRIG 105.0 06/12/2019 1429   HDL 41.40 06/12/2019 1429   HDL 49 01/16/2019 1317   CHOLHDL 4 06/12/2019 1429   VLDL 21.0 06/12/2019 1429   LDLCALC 98 06/12/2019 1429   LDLCALC 122 (H) 01/16/2019 1317   Hepatic Function Panel     Component Value Date/Time   PROT 6.6 06/12/2019 1429   ALBUMIN 4.1 06/12/2019 1429   AST 12 06/12/2019 1429   ALT 9 06/12/2019 1429   ALKPHOS 60 06/12/2019 1429   BILITOT 0.4 06/12/2019 1429   BILIDIR 0.1 12/31/2010 0933      Component Value Date/Time   TSH 2.04 06/09/2018 0903   TSH 1.68 06/03/2017 1044   TSH 1.57 05/29/2016 1036      I, Trixie Dredge, am acting as Location manager for Ilene Qua, MD  I have reviewed the above documentation for accuracy and completeness, and I agree with the above. - Ilene Qua, MD

## 2019-09-28 ENCOUNTER — Other Ambulatory Visit: Payer: Self-pay

## 2019-09-28 ENCOUNTER — Encounter (INDEPENDENT_AMBULATORY_CARE_PROVIDER_SITE_OTHER): Payer: Self-pay | Admitting: Family Medicine

## 2019-09-28 ENCOUNTER — Ambulatory Visit (INDEPENDENT_AMBULATORY_CARE_PROVIDER_SITE_OTHER): Payer: 59 | Admitting: Family Medicine

## 2019-09-28 VITALS — BP 129/82 | HR 71 | Temp 98.4°F | Ht 64.0 in | Wt 182.0 lb

## 2019-09-28 DIAGNOSIS — E559 Vitamin D deficiency, unspecified: Secondary | ICD-10-CM | POA: Diagnosis not present

## 2019-09-28 DIAGNOSIS — E7849 Other hyperlipidemia: Secondary | ICD-10-CM | POA: Diagnosis not present

## 2019-09-28 DIAGNOSIS — Z6831 Body mass index (BMI) 31.0-31.9, adult: Secondary | ICD-10-CM

## 2019-09-28 DIAGNOSIS — E669 Obesity, unspecified: Secondary | ICD-10-CM | POA: Diagnosis not present

## 2019-10-01 NOTE — Progress Notes (Signed)
Office: [phone]  /  Fax: [phone]   HPI:   Chief Complaint: OBESITY Ashlee Swanson is here to discuss her progress with her obesity treatment plan. She is on the Category 2 plan and is following her eating plan approximately 65-70 % of the time. She states she is doing yoga for 60 minutes 2 times per week.  Ashlee Swanson's last 2 appointments were virtual. She had a weight loss down to 182 lbs, but on her home scale she is back up to 186 lbs. She voices work is busy and home school is busy, and she doesn't have groceries at home. She notes work should be winding down and groceries are now stocked. She plans to have her sone go back to school for 2 days every other week. She has dental surgery scheduled on 10/16/2019. Her weight is 182 lb (82.6 kg) today and has not lost weight since her last visit. She has lost 12 lbs since starting treatment with [place].  Vitamin D Deficiency Ashlee Swanson has a diagnosis of vitamin D deficiency. She is currently taking OTC Vit D. She notes fatigue and denies nausea, vomiting or muscle weakness.  Hyperlipidemia Ashlee Swanson has hyperlipidemia and has been trying to improve her cholesterol levels with intensive lifestyle modification including a low saturated fat diet, exercise and weight loss. Last LDL was previously 122, but recently of 13 in late June. She is not on statin and denies any chest pain, claudication or myalgias.  ASSESSMENT AND PLAN:  Vitamin D deficiency  Other hyperlipidemia  Class 1 obesity with serious comorbidity and body mass index (BMI) of 31.0 to 31.9 in adult, unspecified obesity type  PLAN:  Vitamin D Deficiency Ashlee Swanson was informed that low vitamin D levels contributes to fatigue and are associated with obesity, breast, and colon cancer. Ashlee Swanson agrees to continue taking OTC Vit D and will follow up for routine testing of vitamin D, at least 2-3 times per year. She was informed of the risk of over-replacement of vitamin D and agrees to not increase  her dose unless she discusses this with [place] first. We will repeat lab in mid November. Ashlee Swanson agrees to follow up with our clinic in 2 weeks.  Hyperlipidemia Ashlee Swanson was informed of the American Heart Association Guidelines emphasizing intensive lifestyle modifications as the first line treatment for hyperlipidemia. We discussed many lifestyle modifications today in depth, and Ashlee Swanson will continue to work on decreasing saturated fats such as fatty red meat, butter and many fried foods. She will also increase vegetables and lean protein in her diet and continue to work on exercise and weight loss efforts. We will repeat labs in mid November.  I spent > than 50% of the 15 minute visit on counseling as documented in the note.  Obesity Ashlee Swanson is currently in the action stage of change. As such, her goal is to continue with weight loss efforts She has agreed to follow the Category 2 plan Ashlee Swanson has been instructed to work up to a goal of 150 minutes of combined cardio and strengthening exercise per week for weight loss and overall health benefits. We discussed the following Behavioral Modification Strategies today: increasing lean protein intake, increasing vegetables and work on meal planning and easy cooking plans, keeping healthy foods in the home, and planning for success   Ashlee Swanson has agreed to follow up with our clinic in 2 weeks. She was informed of the importance of frequent follow up visits to maximize her success with intensive lifestyle modifications for her multiple health  conditions.  ALLERGIES: Allergies  Allergen Reactions  . Minocycline Hives  . Codeine Nausea Only    Abdominal pain, shakes  . Tetracycline Hives    MEDICATIONS: Current Outpatient Medications on File Prior to Visit  Medication Sig Dispense Refill  . Ergocalciferol 50 MCG (2000 UT) TABS Take 1 tablet by mouth daily. 30 tablet   . Eszopiclone 3 MG TABS Take 1 tablet (3 mg total) by mouth at bedtime as needed. Take  immediately before bedtime 30 tablet 0  . Ibuprofen-diphenhydrAMINE Cit (ADVIL PM PO) Take by mouth.    . polyethylene glycol (MIRALAX / GLYCOLAX) packet Take 17 g by mouth daily.     No current facility-administered medications on file prior to visit.     PAST MEDICAL HISTORY: Past Medical History:  Diagnosis Date  . Arthritis    In the Back  . Back pain   . Bunion   . DDD (degenerative disc disease), lumbar   . High cholesterol   . IBS (irritable bowel syndrome)   . Insomnia   . Kidney stone   . Lactose intolerance   . Snoring     PAST SURGICAL HISTORY: Past Surgical History:  Procedure Laterality Date  . BUNIONECTOMY Left 1999   modified mcbride bunionectony    SOCIAL HISTORY: Social History   Tobacco Use  . Smoking status: Former Smoker    Packs/day: 0.25    Years: 10.00    Pack years: 2.50    Quit date: 05/29/2004    Years since quitting: 15.3  . Smokeless tobacco: Never Used  Substance Use Topics  . Alcohol use: Yes    Alcohol/week: 0.0 standard drinks    Comment: socially  . Drug use: No    FAMILY HISTORY: Family History  Problem Relation Age of Onset  . Colon cancer Paternal Aunt   . Breast cancer Maternal Aunt   . Heart disease Unknown   . Stroke Maternal Grandmother   . Hypertension Mother   . Hypertension Father   . Hypertension Brother   . Stroke Paternal Grandfather     ROS: Review of Systems  Constitutional: Positive for malaise/fatigue. Negative for weight loss.  Cardiovascular: Negative for chest pain and claudication.  Gastrointestinal: Negative for nausea and vomiting.  Musculoskeletal: Negative for myalgias.       Negative muscle weakness    PHYSICAL EXAM: Blood pressure 129/82, pulse 71, temperature 98.4 F (36.9 C), temperature source Oral, height 5\' 4"  (1.626 m), weight 182 lb (82.6 kg), SpO2 95 %. Body mass index is 31.24 kg/m. Physical Exam Vitals signs reviewed.  Constitutional:      Appearance: Normal appearance.  She is obese.  Cardiovascular:     Rate and Rhythm: Normal rate.     Pulses: Normal pulses.  Pulmonary:     Effort: Pulmonary effort is normal.     Breath sounds: Normal breath sounds.  Musculoskeletal: Normal range of motion.  Skin:    General: Skin is warm.  Neurological:     Mental Status: She is alert and oriented to person, place, and time.  Psychiatric:        Mood and Affect: Mood normal.        Behavior: Behavior normal.     RECENT LABS AND TESTS: BMET    Component Value Date/Time   NA 139 06/12/2019 1429   K 4.5 06/12/2019 1429   CL 103 06/12/2019 1429   CO2 29 06/12/2019 1429   GLUCOSE 82 06/12/2019 1429   BUN 14  06/12/2019 1429   CREATININE 0.84 06/12/2019 1429   CALCIUM 9.6 06/12/2019 1429   GFRNONAA 74 12/31/2008 1110   GFRAA 90 12/31/2008 1110   Lab Results  Component Value Date   HGBA1C 5.2 06/12/2019   HGBA1C 5.2 09/07/2018   Lab Results  Component Value Date   INSULIN 6.8 09/07/2018   CBC    Component Value Date/Time   WBC 7.9 06/09/2018 0903   RBC 4.11 06/09/2018 0903   HGB 14.2 06/09/2018 0903   HCT 40.7 06/09/2018 0903   PLT 353.0 06/09/2018 0903   MCV 99.1 06/09/2018 0903   MCHC 34.9 06/09/2018 0903   RDW 12.3 06/09/2018 0903   LYMPHSABS 2.7 06/09/2018 0903   MONOABS 0.6 06/09/2018 0903   EOSABS 0.1 06/09/2018 0903   BASOSABS 0.1 06/09/2018 0903   Iron/TIBC/Ferritin/ %Sat No results found for: IRON, TIBC, FERRITIN, IRONPCTSAT Lipid Panel     Component Value Date/Time   CHOL 160 06/12/2019 1429   CHOL 186 01/16/2019 1317   TRIG 105.0 06/12/2019 1429   HDL 41.40 06/12/2019 1429   HDL 49 01/16/2019 1317   CHOLHDL 4 06/12/2019 1429   VLDL 21.0 06/12/2019 1429   LDLCALC 98 06/12/2019 1429   LDLCALC 122 (H) 01/16/2019 1317   Hepatic Function Panel     Component Value Date/Time   PROT 6.6 06/12/2019 1429   ALBUMIN 4.1 06/12/2019 1429   AST 12 06/12/2019 1429   ALT 9 06/12/2019 1429   ALKPHOS 60 06/12/2019 1429   BILITOT  0.4 06/12/2019 1429   BILIDIR 0.1 12/31/2010 0933      Component Value Date/Time   TSH 2.04 06/09/2018 0903   TSH 1.68 06/03/2017 1044   TSH 1.57 05/29/2016 1036      OBESITY BEHAVIORAL INTERVENTION VISIT  Today's visit was # 23   Starting weight: 194 lbs Starting date: 09/07/18 Today's weight : 182 lbs Today's date: 09/28/2019 Total lbs lost to date: 12    ASK: We discussed the diagnosis of obesity with Ashlee Swanson today and Ashlee Swanson agreed to give [place] permission to discuss obesity behavioral modification therapy today.  ASSESS: Ashlee Swanson has the diagnosis of obesity and her BMI today is 31.22 Ashlee Swanson is in the action stage of change   ADVISE: Ashlee Swanson was educated on the multiple health risks of obesity as well as the benefit of weight loss to improve her health. She was advised of the need for long term treatment and the importance of lifestyle modifications to improve her current health and to decrease her risk of future health problems.  AGREE: Multiple dietary modification options and treatment options were discussed and  Ashlee Swanson agreed to follow the recommendations documented in the above note.  ARRANGE: Ashlee Swanson was educated on the importance of frequent visits to treat obesity as outlined per CMS and USPSTF guidelines and agreed to schedule her next follow up appointment today.  I, Trixie Dredge, am acting as transcriptionist for Ilene Qua, MD  I have reviewed the above documentation for accuracy and completeness, and I agree with the above. - Ilene Qua, MD

## 2019-10-19 ENCOUNTER — Ambulatory Visit (INDEPENDENT_AMBULATORY_CARE_PROVIDER_SITE_OTHER): Payer: 59 | Admitting: Family Medicine

## 2019-10-26 ENCOUNTER — Other Ambulatory Visit: Payer: Self-pay

## 2019-10-26 ENCOUNTER — Ambulatory Visit (INDEPENDENT_AMBULATORY_CARE_PROVIDER_SITE_OTHER): Payer: 59 | Admitting: Family Medicine

## 2019-10-26 ENCOUNTER — Encounter (INDEPENDENT_AMBULATORY_CARE_PROVIDER_SITE_OTHER): Payer: Self-pay | Admitting: Family Medicine

## 2019-10-26 VITALS — BP 148/79 | HR 73 | Temp 98.5°F | Ht 64.0 in | Wt 184.0 lb

## 2019-10-26 DIAGNOSIS — E8881 Metabolic syndrome: Secondary | ICD-10-CM

## 2019-10-26 DIAGNOSIS — E669 Obesity, unspecified: Secondary | ICD-10-CM

## 2019-10-26 DIAGNOSIS — Z6831 Body mass index (BMI) 31.0-31.9, adult: Secondary | ICD-10-CM

## 2019-10-30 NOTE — Progress Notes (Signed)
Office: [phone]  /  Fax: [phone]   HPI:   Chief Complaint: OBESITY Ashlee Swanson is here to discuss her progress with her obesity treatment plan. She is on the Category 2 plan and is following her eating plan approximately 65 % of the time. She states she is doing yoga 60 minutes 2 times per week. Ashlee Swanson had extensive dental and sinus surgery October 26th and she has been unable to eat normal food. She has had a lot of stress due to trying to catch up at work since she has been off because of her dental surgery, Her weight is 184 lb (83.5 kg) today and has had a weight gain of 2 pounds over a period of 4 weeks since her last visit. She has lost 10 lbs since starting treatment with [place].  Insulin Resistance Ashlee Swanson has a diagnosis of insulin resistance based on her elevated fasting insulin level >5. Although Ashlee Swanson's blood glucose readings are still under good control, insulin resistance puts her at greater risk of metabolic syndrome and diabetes. Ashlee Swanson admits to polyphagia at night. She is not taking metformin currently and she continues to work on diet and exercise to decrease risk of diabetes. Lab Results  Component Value Date   HGBA1C 5.2 06/12/2019    ASSESSMENT AND PLAN:  Insulin resistance  Class 1 obesity with serious comorbidity and body mass index (BMI) of 31.0 to 31.9 in adult, unspecified obesity type  PLAN:  Insulin Resistance Ashlee Swanson will continue to work on weight loss, exercise, and decreasing simple carbohydrates in her diet to help decrease the risk of diabetes. We discussed metformin and side effects. She will think about this. Ashlee Swanson will continue with the meal plan and follow up with [place] as directed to monitor her progress.  Obesity Ashlee Swanson is currently in the action stage of change. As such, her goal is to continue with weight loss efforts She has agreed to keep a food journal with 1200 to 1300 calories and 80 to 85 grams of protein daily and follow the Category  2 plan Ashlee Swanson will continue doing yoga for 60 minutes, 2 times per week for weight loss and overall health benefits. We discussed the following Behavioral Modification Strategies today: planning for success, keep a strict food journal and increasing lean protein intake  Ashlee Swanson will switch to journaling while she is unable to eat regular food.  Ashlee Swanson has agreed to follow up with our clinic in 4 weeks. She was informed of the importance of frequent follow up visits to maximize her success with intensive lifestyle modifications for her multiple health conditions.  ALLERGIES: Allergies  Allergen Reactions  . Minocycline Hives  . Codeine Nausea Only    Abdominal pain, shakes  . Tetracycline Hives    MEDICATIONS: Current Outpatient Medications on File Prior to Visit  Medication Sig Dispense Refill  . Ergocalciferol 50 MCG (2000 UT) TABS Take 1 tablet by mouth daily. 30 tablet   . Eszopiclone 3 MG TABS Take 1 tablet (3 mg total) by mouth at bedtime as needed. Take immediately before bedtime 30 tablet 0  . Ibuprofen-diphenhydrAMINE Cit (ADVIL PM PO) Take by mouth.    . polyethylene glycol (MIRALAX / GLYCOLAX) packet Take 17 g by mouth daily.     No current facility-administered medications on file prior to visit.     PAST MEDICAL HISTORY: Past Medical History:  Diagnosis Date  . Arthritis    In the Back  . Back pain   . Bunion   .  DDD (degenerative disc disease), lumbar   . High cholesterol   . IBS (irritable bowel syndrome)   . Insomnia   . Kidney stone   . Lactose intolerance   . Snoring     PAST SURGICAL HISTORY: Past Surgical History:  Procedure Laterality Date  . BUNIONECTOMY Left 1999   modified mcbride bunionectony    SOCIAL HISTORY: Social History   Tobacco Use  . Smoking status: Former Smoker    Packs/day: 0.25    Years: 10.00    Pack years: 2.50    Quit date: 05/29/2004    Years since quitting: 15.4  . Smokeless tobacco: Never Used  Substance Use Topics   . Alcohol use: Yes    Alcohol/week: 0.0 standard drinks    Comment: socially  . Drug use: No    FAMILY HISTORY: Family History  Problem Relation Age of Onset  . Colon cancer Paternal Aunt   . Breast cancer Maternal Aunt   . Heart disease Unknown   . Stroke Maternal Grandmother   . Hypertension Mother   . Hypertension Father   . Hypertension Brother   . Stroke Paternal Grandfather     ROS: Review of Systems  Constitutional: Negative for weight loss.  Endo/Heme/Allergies:       Positive for polyphagia  Psychiatric/Behavioral:       Positive for Stress    PHYSICAL EXAM: Blood pressure (!) 148/79, pulse 73, temperature 98.5 F (36.9 C), temperature source Oral, height 5\' 4"  (1.626 m), weight 184 lb (83.5 kg), SpO2 97 %. Body mass index is 31.58 kg/m. Physical Exam Vitals signs reviewed.  Constitutional:      Appearance: Normal appearance. She is well-developed. She is obese.  Cardiovascular:     Rate and Rhythm: Normal rate.  Pulmonary:     Effort: Pulmonary effort is normal.  Musculoskeletal: Normal range of motion.  Skin:    General: Skin is warm and dry.  Neurological:     Mental Status: She is alert and oriented to person, place, and time.  Psychiatric:        Mood and Affect: Mood normal.        Behavior: Behavior normal.     RECENT LABS AND TESTS: BMET    Component Value Date/Time   NA 139 06/12/2019 1429   K 4.5 06/12/2019 1429   CL 103 06/12/2019 1429   CO2 29 06/12/2019 1429   GLUCOSE 82 06/12/2019 1429   BUN 14 06/12/2019 1429   CREATININE 0.84 06/12/2019 1429   CALCIUM 9.6 06/12/2019 1429   GFRNONAA 74 12/31/2008 1110   GFRAA 90 12/31/2008 1110   Lab Results  Component Value Date   HGBA1C 5.2 06/12/2019   HGBA1C 5.2 09/07/2018   Lab Results  Component Value Date   INSULIN 6.8 09/07/2018   CBC    Component Value Date/Time   WBC 7.9 06/09/2018 0903   RBC 4.11 06/09/2018 0903   HGB 14.2 06/09/2018 0903   HCT 40.7 06/09/2018 0903    PLT 353.0 06/09/2018 0903   MCV 99.1 06/09/2018 0903   MCHC 34.9 06/09/2018 0903   RDW 12.3 06/09/2018 0903   LYMPHSABS 2.7 06/09/2018 0903   MONOABS 0.6 06/09/2018 0903   EOSABS 0.1 06/09/2018 0903   BASOSABS 0.1 06/09/2018 0903   Iron/TIBC/Ferritin/ %Sat No results found for: IRON, TIBC, FERRITIN, IRONPCTSAT Lipid Panel     Component Value Date/Time   CHOL 160 06/12/2019 1429   CHOL 186 01/16/2019 1317   TRIG 105.0 06/12/2019 1429  HDL 41.40 06/12/2019 1429   HDL 49 01/16/2019 1317   CHOLHDL 4 06/12/2019 1429   VLDL 21.0 06/12/2019 1429   LDLCALC 98 06/12/2019 1429   LDLCALC 122 (H) 01/16/2019 1317   Hepatic Function Panel     Component Value Date/Time   PROT 6.6 06/12/2019 1429   ALBUMIN 4.1 06/12/2019 1429   AST 12 06/12/2019 1429   ALT 9 06/12/2019 1429   ALKPHOS 60 06/12/2019 1429   BILITOT 0.4 06/12/2019 1429   BILIDIR 0.1 12/31/2010 0933      Component Value Date/Time   TSH 2.04 06/09/2018 0903   TSH 1.68 06/03/2017 1044   TSH 1.57 05/29/2016 1036     Ref. Range 06/12/2019 14:29  VITD Latest Ref Range: 30.00 - 100.00 ng/mL 60.25    OBESITY BEHAVIORAL INTERVENTION VISIT  Today's visit was # 24  Starting weight: 194 lbs Starting date: 09/07/2018 Today's weight : 184 lbs Today's date: 10/26/2019 Total lbs lost to date: 10    10/26/2019  Height 5\' 4"  (1.626 m)  Weight 184 lb (83.5 kg)  BMI (Calculated) 31.57  BLOOD PRESSURE - SYSTOLIC [id number]  BLOOD PRESSURE - DIASTOLIC 79   Body Fat % [id number] %  Total Body Water (lbs) 76.6 lbs    ASK: We discussed the diagnosis of obesity with Ashlee Swanson today and Ashlee Swanson agreed to give [place] permission to discuss obesity behavioral modification therapy today.  ASSESS: Sumaia has the diagnosis of obesity and her BMI today is 31.57 Lera is in the action stage of change   ADVISE: Sharmin was educated on the multiple health risks of obesity as well as the benefit of weight loss to improve her health. She  was advised of the need for long term treatment and the importance of lifestyle modifications to improve her current health and to decrease her risk of future health problems.  AGREE: Multiple dietary modification options and treatment options were discussed and  Tykeyah agreed to follow the recommendations documented in the above note.  ARRANGE: Vallerie was educated on the importance of frequent visits to treat obesity as outlined per CMS and USPSTF guidelines and agreed to schedule her next follow up appointment today.  Corey Skains, am acting as Location manager for [employer], FNP-C.  I have reviewed the above documentation for accuracy and completeness, and I agree with the above.  - Ashlee Sherard, FNP-C.

## 2019-10-31 ENCOUNTER — Encounter (INDEPENDENT_AMBULATORY_CARE_PROVIDER_SITE_OTHER): Payer: Self-pay | Admitting: Family Medicine

## 2019-11-23 ENCOUNTER — Other Ambulatory Visit: Payer: Self-pay

## 2019-11-23 ENCOUNTER — Ambulatory Visit (INDEPENDENT_AMBULATORY_CARE_PROVIDER_SITE_OTHER): Payer: 59 | Admitting: Family Medicine

## 2019-11-23 ENCOUNTER — Encounter (INDEPENDENT_AMBULATORY_CARE_PROVIDER_SITE_OTHER): Payer: Self-pay | Admitting: Family Medicine

## 2019-11-23 VITALS — BP 141/89 | HR 66 | Temp 98.3°F | Ht 64.0 in | Wt 183.0 lb

## 2019-11-23 DIAGNOSIS — E8881 Metabolic syndrome: Secondary | ICD-10-CM

## 2019-11-23 DIAGNOSIS — E669 Obesity, unspecified: Secondary | ICD-10-CM | POA: Diagnosis not present

## 2019-11-23 DIAGNOSIS — I10 Essential (primary) hypertension: Secondary | ICD-10-CM

## 2019-11-23 DIAGNOSIS — Z6831 Body mass index (BMI) 31.0-31.9, adult: Secondary | ICD-10-CM

## 2019-11-23 DIAGNOSIS — Z9189 Other specified personal risk factors, not elsewhere classified: Secondary | ICD-10-CM | POA: Diagnosis not present

## 2019-11-23 MED ORDER — METFORMIN HCL 500 MG PO TABS: 500.0000 mg | ORAL_TABLET | Freq: Every day | ORAL | 0 refills | Status: DC

## 2019-11-23 MED ORDER — METFORMIN HCL 500 MG PO TABS: 500.0000 mg | ORAL_TABLET | Freq: Every day | ORAL | 1 refills | Status: DC

## 2019-11-27 NOTE — Progress Notes (Signed)
Office: [phone]  /  Fax: [phone]   HPI:   Chief Complaint: OBESITY Ashlee Swanson is here to discuss her progress with her obesity treatment plan. She is on the Category 2 plan and is following her eating plan approximately 65 to 70 % of the time. She states she is doing yoga 60 minutes 2 times per week. Ashlee Swanson prepared Thanksgiving at home. She was off the plan somewhat due to leftovers. She she reports getting her protein in most days. Her weight is 183 lb (83 kg) today and has had a weight loss of 1 pound over a period of 4 weeks since her last visit. She has lost 11 lbs since starting treatment with [place].  Hypertension Ashlee Swanson is a 48 y.o. female with hypertension. She has had two elevated blood pressures in the office including today. She has not previously been diagnosed with HTN.  Ashlee Swanson denies chest pain or shortness of breath on exertion. She is working weight loss to help control her blood pressure with the goal of decreasing her risk of heart attack and stroke.  BP Readings from Last 3 Encounters:  11/23/19 (!) 141/89  10/26/19 (!) 148/79  09/28/19 129/82    Insulin Resistance Ashlee Swanson has a diagnosis of insulin resistance based on her elevated fasting insulin level >5.  She has polyphagia in the evening. We discussed metformin at the last visit, and she would like to try it. She continues to work on diet and exercise to decrease risk of diabetes.  At risk for cardiovascular disease Ashlee Swanson is at a higher than average risk for cardiovascular disease due to obesity, hypertension and insulin resistance. She currently denies any chest pain.  ASSESSMENT AND PLAN:  Essential hypertension  Insulin resistance - Plan: metFORMIN (GLUCOPHAGE) 500 MG tablet, DISCONTINUED: metFORMIN (GLUCOPHAGE) 500 MG tablet  At risk for heart disease  Class 1 obesity with serious comorbidity and body mass index (BMI) of 31.0 to 31.9 in adult, unspecified obesity  type  PLAN:  Hypertension Ashlee Swanson will continue working on healthy weight loss and exercise to improve blood pressure control. Ashlee Swanson agreed with this plan and agreed to follow up as directed. We will hold off on medication for now but I advised her we may need to add medication if BPs continue to be elevated. She will buy a blood pressure cuff at home and she will check her blood pressure 3 times per week.   Insulin Resistance Ashlee Swanson will continue to work on weight loss, exercise, and decreasing simple carbohydrates in her diet to help decrease the risk of diabetes. Ashlee Swanson agreed to start metformin 500 mg with dinner #30 with no refills and follow up with [place] as directed to monitor her progress.  Cardiovascular risk counseling Ashlee Swanson was given (~15 minutes) coronary artery disease prevention counseling today. She is 48 y.o. female and has risk factors for heart disease including obesity, hypertension and insulin resistance. We discussed intensive lifestyle modifications today with an emphasis on specific weight loss instructions and strategies.   Obesity Ashlee Swanson is currently in the action stage of change. As such, her goal is to continue with weight loss efforts She has agreed to follow the Category 2 plan Ashlee Swanson will continue doing yoga for 60 minutes, 2 times per week for weight loss and overall health benefits. We discussed the following Behavioral Modification Strategies today: planning for success, better snacking choices and increasing lean protein intake  Ashlee Swanson has agreed to follow up with our clinic in 5 weeks.  She was informed of the importance of frequent follow up visits to maximize her success with intensive lifestyle modifications for her multiple health conditions.  ALLERGIES: Allergies  Allergen Reactions   Minocycline Hives   Codeine Nausea Only    Abdominal pain, shakes   Tetracycline Hives    MEDICATIONS: Current Outpatient Medications on File Prior to Visit   Medication Sig Dispense Refill   Ergocalciferol 50 MCG (2000 UT) TABS Take 1 tablet by mouth daily. 30 tablet    Eszopiclone 3 MG TABS Take 1 tablet (3 mg total) by mouth at bedtime as needed. Take immediately before bedtime 30 tablet 0   Ibuprofen-diphenhydrAMINE Cit (ADVIL PM PO) Take by mouth.     polyethylene glycol (MIRALAX / GLYCOLAX) packet Take 17 g by mouth daily.     No current facility-administered medications on file prior to visit.     PAST MEDICAL HISTORY: Past Medical History:  Diagnosis Date   Arthritis    In the Back   Back pain    Bunion    DDD (degenerative disc disease), lumbar    High cholesterol    IBS (irritable bowel syndrome)    Insomnia    Kidney stone    Lactose intolerance    Snoring     PAST SURGICAL HISTORY: Past Surgical History:  Procedure Laterality Date   BUNIONECTOMY Left 1999   modified mcbride bunionectony    SOCIAL HISTORY: Social History   Tobacco Use   Smoking status: Former Smoker    Packs/day: 0.25    Years: 10.00    Pack years: 2.50    Quit date: 05/29/2004    Years since quitting: 15.5   Smokeless tobacco: Never Used  Substance Use Topics   Alcohol use: Yes    Alcohol/week: 0.0 standard drinks    Comment: socially   Drug use: No    FAMILY HISTORY: Family History  Problem Relation Age of Onset   Colon cancer Paternal 84    Breast cancer Maternal Aunt    Heart disease Unknown    Stroke Maternal Grandmother    Hypertension Mother    Hypertension Father    Hypertension Brother    Stroke Paternal Grandfather     ROS: Review of Systems  Constitutional: Positive for weight loss.  Respiratory: Negative for shortness of breath (on exertion).   Cardiovascular: Negative for chest pain.  Endo/Heme/Allergies:       Positive for polyphagia    PHYSICAL EXAM: Blood pressure (!) 141/89, pulse 66, temperature 98.3 F (36.8 C), temperature source Oral, height 5\' 4"  (1.626 m), weight 183 lb  (83 kg), SpO2 99 %. Body mass index is 31.41 kg/m. Physical Exam Vitals signs reviewed.  Constitutional:      Appearance: Normal appearance. She is well-developed. She is obese.  Cardiovascular:     Rate and Rhythm: Normal rate.  Pulmonary:     Effort: Pulmonary effort is normal.  Musculoskeletal: Normal range of motion.  Skin:    General: Skin is warm and dry.  Neurological:     Mental Status: She is alert and oriented to person, place, and time.  Psychiatric:        Mood and Affect: Mood normal.        Behavior: Behavior normal.     RECENT LABS AND TESTS: BMET    Component Value Date/Time   NA 139 06/12/2019 1429   K 4.5 06/12/2019 1429   CL 103 06/12/2019 1429   CO2 29 06/12/2019 1429  GLUCOSE 82 06/12/2019 1429   BUN 14 06/12/2019 1429   CREATININE 0.84 06/12/2019 1429   CALCIUM 9.6 06/12/2019 1429   GFRNONAA 74 12/31/2008 1110   GFRAA 90 12/31/2008 1110   Lab Results  Component Value Date   HGBA1C 5.2 06/12/2019   HGBA1C 5.2 09/07/2018   Lab Results  Component Value Date   INSULIN 6.8 09/07/2018   CBC    Component Value Date/Time   WBC 7.9 06/09/2018 0903   RBC 4.11 06/09/2018 0903   HGB 14.2 06/09/2018 0903   HCT 40.7 06/09/2018 0903   PLT 353.0 06/09/2018 0903   MCV 99.1 06/09/2018 0903   MCHC 34.9 06/09/2018 0903   RDW 12.3 06/09/2018 0903   LYMPHSABS 2.7 06/09/2018 0903   MONOABS 0.6 06/09/2018 0903   EOSABS 0.1 06/09/2018 0903   BASOSABS 0.1 06/09/2018 0903   Iron/TIBC/Ferritin/ %Sat No results found for: IRON, TIBC, FERRITIN, IRONPCTSAT Lipid Panel     Component Value Date/Time   CHOL 160 06/12/2019 1429   CHOL 186 01/16/2019 1317   TRIG 105.0 06/12/2019 1429   HDL 41.40 06/12/2019 1429   HDL 49 01/16/2019 1317   CHOLHDL 4 06/12/2019 1429   VLDL 21.0 06/12/2019 1429   LDLCALC 98 06/12/2019 1429   LDLCALC 122 (H) 01/16/2019 1317   Hepatic Function Panel     Component Value Date/Time   PROT 6.6 06/12/2019 1429   ALBUMIN 4.1  06/12/2019 1429   AST 12 06/12/2019 1429   ALT 9 06/12/2019 1429   ALKPHOS 60 06/12/2019 1429   BILITOT 0.4 06/12/2019 1429   BILIDIR 0.1 12/31/2010 0933      Component Value Date/Time   TSH 2.04 06/09/2018 0903   TSH 1.68 06/03/2017 1044   TSH 1.57 05/29/2016 1036     Ref. Range 06/12/2019 14:29  VITD Latest Ref Range: 30.00 - 100.00 ng/mL 60.25    OBESITY BEHAVIORAL INTERVENTION VISIT  Today's visit was # 25  Starting weight: 194 lbs Starting date: 09/07/2018 Today's weight : 183 lbs Today's date: 11/23/2019 Total lbs lost to date: 11    11/23/2019  Height 5\' 4"  (1.626 m)  Weight 183 lb (83 kg)  BMI (Calculated) 31.4  BLOOD PRESSURE - SYSTOLIC [id number]  BLOOD PRESSURE - DIASTOLIC 89   Body Fat % 99991111 %  Total Body Water (lbs) 75 lbs    ASK: We discussed the diagnosis of obesity with Ashlee Swanson today and Ashlee Swanson agreed to give [place] permission to discuss obesity behavioral modification therapy today.  ASSESS: Ashlee Swanson has the diagnosis of obesity and her BMI today is 31.4 Ashlee Swanson is in the action stage of change   ADVISE: Ashlee Swanson was educated on the multiple health risks of obesity as well as the benefit of weight loss to improve her health. She was advised of the need for long term treatment and the importance of lifestyle modifications to improve her current health and to decrease her risk of future health problems.  AGREE: Multiple dietary modification options and treatment options were discussed and  Ashlee Swanson agreed to follow the recommendations documented in the above note.  ARRANGE: Ashlee Swanson was educated on the importance of frequent visits to treat obesity as outlined per CMS and USPSTF guidelines and agreed to schedule her next follow up appointment today.  I, Ashlee Swanson, am acting as transcriptionist for Ashlee Schwab, FNP-C  I have reviewed the above documentation for accuracy and completeness, and I agree with the above.  - Ashlee Riolo, FNP-C.

## 2019-11-28 ENCOUNTER — Encounter (INDEPENDENT_AMBULATORY_CARE_PROVIDER_SITE_OTHER): Payer: Self-pay | Admitting: Family Medicine

## 2019-11-28 DIAGNOSIS — I10 Essential (primary) hypertension: Secondary | ICD-10-CM | POA: Insufficient documentation

## 2019-12-12 ENCOUNTER — Other Ambulatory Visit: Payer: Self-pay

## 2019-12-12 ENCOUNTER — Ambulatory Visit (INDEPENDENT_AMBULATORY_CARE_PROVIDER_SITE_OTHER): Payer: 59 | Admitting: Family Medicine

## 2019-12-12 ENCOUNTER — Encounter: Payer: Self-pay | Admitting: Family Medicine

## 2019-12-12 VITALS — BP 120/86 | HR 78 | Temp 97.7°F | Resp 18 | Ht 64.0 in | Wt 187.6 lb

## 2019-12-12 DIAGNOSIS — Z23 Encounter for immunization: Secondary | ICD-10-CM | POA: Diagnosis not present

## 2019-12-12 DIAGNOSIS — Z0000 Encounter for general adult medical examination without abnormal findings: Secondary | ICD-10-CM | POA: Diagnosis not present

## 2019-12-12 NOTE — Progress Notes (Signed)
Patient ID: Ashlee Swanson, female    DOB: [birth year]  Age: 48 y.o. MRN: [id number]    Subjective:  Subjective  HPI Ashlee Swanson presents for cpe.  No new complaints   Review of Systems  Constitutional: Negative for activity change, appetite change, chills, diaphoresis, fatigue, fever and unexpected weight change.  HENT: Negative for congestion and hearing loss.   Eyes: Negative for pain, discharge, redness and visual disturbance.  Respiratory: Negative for cough, chest tightness, shortness of breath and wheezing.   Cardiovascular: Negative for chest pain, palpitations and leg swelling.  Gastrointestinal: Negative for abdominal distention, abdominal pain, blood in stool, constipation, diarrhea, nausea and vomiting.  Endocrine: Negative for cold intolerance, heat intolerance, polydipsia, polyphagia and polyuria.  Genitourinary: Negative for difficulty urinating, dyspareunia, dysuria, flank pain, frequency, genital sores, hematuria, menstrual problem, pelvic pain, urgency, vaginal discharge and vaginal pain.  Musculoskeletal: Negative for back pain and myalgias.  Skin: Negative for rash.  Allergic/Immunologic: Negative for environmental allergies.  Neurological: Negative for dizziness, weakness, light-headedness, numbness and headaches.  Hematological: Does not bruise/bleed easily.  Psychiatric/Behavioral: Negative for behavioral problems, dysphoric mood and suicidal ideas. The patient is not nervous/anxious.     History Past Medical History:  Diagnosis Date  . Arthritis    In the Back  . Back pain   . Bunion   . DDD (degenerative disc disease), lumbar   . High cholesterol   . IBS (irritable bowel syndrome)   . Insomnia   . Kidney stone   . Lactose intolerance   . Snoring     She has a past surgical history that includes Bunionectomy (Left, 1999).   Her family history includes Breast cancer in her maternal aunt; Colon cancer in her paternal aunt; Heart  disease in her unknown relative; Hypertension in her brother, father, and mother; Stroke in her maternal grandmother and paternal grandfather.She reports that she quit smoking about 15 years ago. She has a 2.50 pack-year smoking history. She has never used smokeless tobacco. She reports current alcohol use. She reports that she does not use drugs.  Current Outpatient Medications on File Prior to Visit  Medication Sig Dispense Refill  . Ergocalciferol 50 MCG (2000 UT) TABS Take 1 tablet by mouth daily. 30 tablet   . Eszopiclone 3 MG TABS Take 1 tablet (3 mg total) by mouth at bedtime as needed. Take immediately before bedtime 30 tablet 0  . Ibuprofen-diphenhydrAMINE Cit (ADVIL PM PO) Take by mouth.    . polyethylene glycol (MIRALAX / GLYCOLAX) packet Take 17 g by mouth daily.     No current facility-administered medications on file prior to visit.     Objective:  Objective  Physical Exam Vitals and nursing note reviewed.  Constitutional:      General: She is not in acute distress.    Appearance: She is well-developed. She is not diaphoretic.  HENT:     Head: Normocephalic and atraumatic.     Right Ear: External ear normal.     Left Ear: External ear normal.     Nose: Nose normal.  Eyes:     General:        Right eye: No discharge.        Left eye: No discharge.     Conjunctiva/sclera: Conjunctivae normal.     Pupils: Pupils are equal, round, and reactive to light.  Neck:     Thyroid: No thyromegaly.     Vascular: No JVD.  Cardiovascular:     Rate and  Rhythm: Normal rate and regular rhythm.     Heart sounds: Normal heart sounds. No murmur.  Pulmonary:     Effort: Pulmonary effort is normal. No respiratory distress.     Breath sounds: Normal breath sounds. No wheezing or rales.  Chest:     Chest wall: No tenderness.  Abdominal:     General: Bowel sounds are normal. There is no distension.     Palpations: Abdomen is soft. There is no mass.     Tenderness: There is no abdominal  tenderness. There is no guarding or rebound.  Genitourinary:    Vagina: Normal.  Musculoskeletal:        General: No tenderness. Normal range of motion.     Cervical back: Normal range of motion and neck supple.  Lymphadenopathy:     Cervical: No cervical adenopathy.  Skin:    General: Skin is warm and dry.     Findings: No erythema or rash.  Neurological:     Mental Status: She is alert and oriented to person, place, and time.     Cranial Nerves: No cranial nerve deficit.     Deep Tendon Reflexes: Reflexes are normal and symmetric.  Psychiatric:        Behavior: Behavior normal.        Thought Content: Thought content normal.        Judgment: Judgment normal.    BP 120/86 (BP Location: Right Arm, Patient Position: Sitting, Cuff Size: Normal)   Pulse 78   Temp 97.7 F (36.5 C) (Temporal)   Resp 18   Ht 5\' 4"  (1.626 m)   Wt 187 lb 9.6 oz (85.1 kg)   SpO2 98%   BMI 32.20 kg/m  Wt Readings from Last 3 Encounters:  12/12/19 187 lb 9.6 oz (85.1 kg)  11/23/19 183 lb (83 kg)  10/26/19 184 lb (83.5 kg)     Lab Results  Component Value Date   WBC 7.9 06/09/2018   HGB 14.2 06/09/2018   HCT 40.7 06/09/2018   PLT 353.0 06/09/2018   GLUCOSE 82 06/12/2019   CHOL 160 06/12/2019   TRIG 105.0 06/12/2019   HDL 41.40 06/12/2019   LDLCALC 98 06/12/2019   ALT 9 06/12/2019   AST 12 06/12/2019   NA 139 06/12/2019   K 4.5 06/12/2019   CL 103 06/12/2019   CREATININE 0.84 06/12/2019   BUN 14 06/12/2019   CO2 29 06/12/2019   TSH 2.04 06/09/2018   HGBA1C 5.2 06/12/2019    No results found.   Assessment & Plan:  Plan  I have discontinued Ashlee Swanson. Ashlee Swanson's metFORMIN. I am also having her maintain her Ibuprofen-diphenhydrAMINE Cit (ADVIL PM PO), polyethylene glycol, Ergocalciferol, and Eszopiclone.  No orders of the defined types were placed in this encounter.   Problem List Items Addressed This Visit      Unprioritized   Preventative health care - Primary    ghm  utd Check labs  See avs        Other Visit Diagnoses    Need for vaccination for DTaP       Relevant Orders   Tdap vaccine (Completed)      Follow-up: Return in about 6 months (around 06/11/2020), or if symptoms worsen or fail to improve, for annual exam, fasting.  Ann Held, DO

## 2019-12-12 NOTE — Assessment & Plan Note (Signed)
ghm utd Check labs  See avs  

## 2019-12-12 NOTE — Patient Instructions (Signed)

## 2019-12-15 ENCOUNTER — Other Ambulatory Visit (INDEPENDENT_AMBULATORY_CARE_PROVIDER_SITE_OTHER): Payer: Self-pay | Admitting: Family Medicine

## 2019-12-15 DIAGNOSIS — E8881 Metabolic syndrome: Secondary | ICD-10-CM

## 2019-12-28 ENCOUNTER — Other Ambulatory Visit: Payer: Self-pay

## 2019-12-28 ENCOUNTER — Ambulatory Visit (INDEPENDENT_AMBULATORY_CARE_PROVIDER_SITE_OTHER): Payer: 59 | Admitting: Family Medicine

## 2019-12-28 ENCOUNTER — Encounter (INDEPENDENT_AMBULATORY_CARE_PROVIDER_SITE_OTHER): Payer: Self-pay | Admitting: Family Medicine

## 2019-12-28 VITALS — BP 139/83 | HR 71 | Temp 98.4°F | Ht 64.0 in | Wt 183.0 lb

## 2019-12-28 DIAGNOSIS — Z9189 Other specified personal risk factors, not elsewhere classified: Secondary | ICD-10-CM | POA: Diagnosis not present

## 2019-12-28 DIAGNOSIS — F3289 Other specified depressive episodes: Secondary | ICD-10-CM | POA: Diagnosis not present

## 2019-12-28 DIAGNOSIS — I10 Essential (primary) hypertension: Secondary | ICD-10-CM

## 2019-12-28 DIAGNOSIS — Z6831 Body mass index (BMI) 31.0-31.9, adult: Secondary | ICD-10-CM

## 2019-12-28 DIAGNOSIS — E8881 Metabolic syndrome: Secondary | ICD-10-CM | POA: Diagnosis not present

## 2019-12-28 DIAGNOSIS — E669 Obesity, unspecified: Secondary | ICD-10-CM

## 2019-12-28 DIAGNOSIS — E559 Vitamin D deficiency, unspecified: Secondary | ICD-10-CM | POA: Diagnosis not present

## 2019-12-28 MED ORDER — BUPROPION HCL ER (SR) 150 MG PO TB12: 150.0000 mg | ORAL_TABLET | Freq: Every day | ORAL | 0 refills | Status: DC

## 2020-01-02 NOTE — Progress Notes (Signed)
Chief Complaint:   Ashlee Swanson is here to discuss her progress with her obesity treatment plan along with follow-up of her obesity related diagnoses. Ashlee Swanson is on the Category 2 Plan and keeping a food journal of 1300 to 1600 calories and 85 grams of protein and states she is following her eating plan approximately 65% of the time. Ashlee Swanson states she is doing yoga and cardio exercise 30 to 60 minutes 2 times per week.  Today's visit was #: 26 Starting weight: 194 lbs Starting date: 09/07/2018 Today's weight: 183 lbs Today's date: 12/28/2019 Total lbs lost to date: 11 Total lbs lost since last in-office visit: 0  Interim History: Ashlee Swanson is happy with weight maintenance but she has been working on getting back on the plan. She has worked on reducing her sodium intake to hopefully decrease her blood pressure.  Subjective:   Insulin resistance Metformin was started at the last visit, but it caused gas and diarrhea, so she took for two weeks, but stopped it. She admits to polyphagia after dinner. She will even snack on things she doesn't like.  Vitamin D deficiency Ashlee Swanson is maintained on OTC vitamin D 2,000 IU daily, and her vitamin D level is at goal.  Essential hypertension Her blood pressure is noram today. She is monitoring her blood pressure at home with a new cuff, but she is unsure if she is doing it correctly. She is not currently on medication for HTN. BP Readings from Last 3 Encounters:  12/28/19 139/83  12/12/19 120/86  11/23/19 (!) 141/89    Other depression with emotional eating (new) Ashlee Swanson feels she eats at times when she is not hungry. She admits to having a stressful year, but she denies depression. Her work has been stressful.  At risk for osteoporosis Ashlee Swanson is at higher risk of osteopenia and osteoporosis due to Vitamin D deficiency.   Assessment/Plan:   Insulin resistance Lareese will continue to work on weight loss, exercise, and  decreasing simple carbohydrates to help decrease the risk of diabetes. Ashlee Swanson will discontinue metformin and follow-up with [place] as directed to closely monitor her progress.  Vitamin D deficiency Low Vitamin D level contributes to fatigue and are associated with obesity, breast, and colon cancer. Ashlee Swanson will continue OTC vitamin D, and she agrees start calcium 1,200 mg daily. She will follow-up for routine testing of vitamin D, at least 2-3 times per year to avoid over-replacement.  Essential hypertension Ashlee Swanson is working on healthy weight loss and exercise to improve blood pressure control. She will continue to monitor her blood pressure at home. I demonstrated how to check her blood pressure with the cuff she brought to the office.   Other depression with emotional eating (new)  Behavior modification techniques were discussed today to help Ashlee Swanson deal with her emotional/non-hunger eating behaviors. Ashlee Swanson agrees to start Bupropion 150 mg daily #30 with no refills. Orders and follow up as documented in patient record.   At risk for osteoporosis Ashlee Swanson was given approximately 15 minutes of osteoporosis prevention counseling today. Ashlee Swanson is at risk for osteopenia and osteoporosis due to her Vitamin D deficiency. She was encouraged to take her Vitamin D and follow her higher calcium diet and increase strengthening exercise to help strengthen her bones and decrease her risk of osteopenia and osteoporosis.  Obesity Ashlee Swanson is currently in the action stage of change. As such, her goal is to continue with weight loss efforts. She has agreed to on the Category 2  Plan and keeping a food journal and adhering to recommended goals of 1300 to 1600 calories and 85 grams of protein daily.   Ashlee Swanson will continue yoga and cardio exercise for 30 to 60 minutes, 2 times per week.  We discussed the following behavioral modification strategies today: decreasing simple carbohydrates, ways to avoid night time snacking,  better snacking choices and planning for success.  Ashlee Swanson has agreed to follow-up with our clinic in 3 weeks. She was informed of the importance of frequent follow-up visits to maximize her success with intensive lifestyle modifications for her multiple health conditions.   Objective:   Blood pressure 139/83, pulse 71, temperature 98.4 F (36.9 C), temperature source Oral, height 5\' 4"  (1.626 m), weight 183 lb (83 kg), SpO2 98 %. Body mass index is 31.41 kg/m.  General: Cooperative, alert, well developed, in no acute distress. HEENT: Conjunctivae and lids unremarkable. Neck: No thyromegaly.  Cardiovascular: Regular rhythm.  Lungs: Normal work of breathing. Extremities: No edema.  Neurologic: No focal deficits.   Lab Results  Component Value Date   CREATININE 0.84 06/12/2019   BUN 14 06/12/2019   NA 139 06/12/2019   K 4.5 06/12/2019   CL 103 06/12/2019   CO2 29 06/12/2019   Lab Results  Component Value Date   ALT 9 06/12/2019   AST 12 06/12/2019   ALKPHOS 60 06/12/2019   BILITOT 0.4 06/12/2019   Lab Results  Component Value Date   HGBA1C 5.2 06/12/2019   HGBA1C 5.2 09/07/2018   Lab Results  Component Value Date   INSULIN 6.8 09/07/2018   Lab Results  Component Value Date   TSH 2.04 06/09/2018   Lab Results  Component Value Date   CHOL 160 06/12/2019   HDL 41.40 06/12/2019   LDLCALC 98 06/12/2019   TRIG 105.0 06/12/2019   CHOLHDL 4 06/12/2019   Lab Results  Component Value Date   WBC 7.9 06/09/2018   HGB 14.2 06/09/2018   HCT 40.7 06/09/2018   MCV 99.1 06/09/2018   PLT 353.0 06/09/2018   No results found for: IRON, TIBC, FERRITIN   Ref. Range 06/12/2019 14:29  VITD Latest Ref Range: 30.00 - 100.00 ng/mL 60.25    Attestation Statements:   Reviewed by clinician on day of visit: allergies, medications, problem list, medical history, surgical history, family history, social history, and previous encounter notes.  Corey Skains, am  acting as Location manager for [employer], FNP-C.  I have reviewed the above documentation for accuracy and completeness, and I agree with the above. -  Georgianne Fick, FNP

## 2020-01-03 ENCOUNTER — Encounter (INDEPENDENT_AMBULATORY_CARE_PROVIDER_SITE_OTHER): Payer: Self-pay | Admitting: Family Medicine

## 2020-01-03 DIAGNOSIS — F329 Major depressive disorder, single episode, unspecified: Secondary | ICD-10-CM | POA: Insufficient documentation

## 2020-01-03 DIAGNOSIS — F32A Depression, unspecified: Secondary | ICD-10-CM | POA: Insufficient documentation

## 2020-01-18 ENCOUNTER — Ambulatory Visit (INDEPENDENT_AMBULATORY_CARE_PROVIDER_SITE_OTHER): Payer: 59 | Admitting: Family Medicine

## 2020-01-18 ENCOUNTER — Other Ambulatory Visit: Payer: Self-pay

## 2020-01-18 ENCOUNTER — Encounter (INDEPENDENT_AMBULATORY_CARE_PROVIDER_SITE_OTHER): Payer: Self-pay | Admitting: Family Medicine

## 2020-01-18 VITALS — BP 134/66 | HR 66 | Temp 98.3°F | Ht 64.0 in | Wt 182.0 lb

## 2020-01-18 DIAGNOSIS — E559 Vitamin D deficiency, unspecified: Secondary | ICD-10-CM | POA: Diagnosis not present

## 2020-01-18 DIAGNOSIS — Z9189 Other specified personal risk factors, not elsewhere classified: Secondary | ICD-10-CM | POA: Diagnosis not present

## 2020-01-18 DIAGNOSIS — E7849 Other hyperlipidemia: Secondary | ICD-10-CM

## 2020-01-18 DIAGNOSIS — Z6831 Body mass index (BMI) 31.0-31.9, adult: Secondary | ICD-10-CM

## 2020-01-18 DIAGNOSIS — E8881 Metabolic syndrome: Secondary | ICD-10-CM | POA: Diagnosis not present

## 2020-01-18 DIAGNOSIS — F3289 Other specified depressive episodes: Secondary | ICD-10-CM

## 2020-01-18 DIAGNOSIS — E669 Obesity, unspecified: Secondary | ICD-10-CM

## 2020-01-18 MED ORDER — BUPROPION HCL ER (SR) 150 MG PO TB12: 150.0000 mg | ORAL_TABLET | Freq: Every day | ORAL | 0 refills | Status: DC

## 2020-01-18 NOTE — Progress Notes (Signed)
Chief Complaint:   Ashlee Swanson is here to discuss her progress with her obesity treatment plan along with follow-up of her obesity related diagnoses. Ashlee Swanson is on the Category 2 Plan and states she is following her eating plan approximately 80% of the time. Ashlee Swanson states she is doing yoga/cardio 30-60 minutes 4 times per week.  Today's visit was #: 77 Starting weight: 194 lbs Starting date: 09/07/2018 Today's weight: 182 lbs Today's date: 01/18/2020 Total lbs lost to date: 12 Total lbs lost since last in-office visit: 1  Interim History: Ashlee Swanson reports being off the plan when eating out. Snacking is decreased since starting bupropion. She is using Hello [employer] a few days a week for dinner.  Subjective:   Vitamin D deficiency. Cresta's last Vitamin D level was at goal (60.25 on 06/12/2019). She is on OTC Vitamin D 1,000. She denies fatigue.  Insulin resistance. Ashlee Swanson has a diagnosis of insulin resistance based on her elevated fasting insulin level >5. She continues to work on diet and exercise to decrease her risk of diabetes. Ashlee Swanson is not on metformin. No polyphagia.  Lab Results  Component Value Date   INSULIN 6.8 09/07/2018   Lab Results  Component Value Date   HGBA1C 5.2 06/12/2019   Other hyperlipidemia. Ashlee Swanson is not on a statin. LDL was at goal at last check; HDL low at 41.   Lab Results  Component Value Date   CHOL 160 06/12/2019   HDL 41.40 06/12/2019   LDLCALC 98 06/12/2019   TRIG 105.0 06/12/2019   CHOLHDL 4 06/12/2019   Lab Results  Component Value Date   ALT 9 06/12/2019   AST 12 06/12/2019   ALKPHOS 60 06/12/2019   BILITOT 0.4 06/12/2019   The 10-year ASCVD risk score [name] DC Jr., et al., 2013) is: 1.3%   Values used to calculate the score:     Age: 49 years     Sex: Female     Is Non-Hispanic African American: No     Diabetic: No     Tobacco smoker: No     Systolic Blood Pressure: Q000111Q mmHg     Is BP treated: No     HDL  Cholesterol: 41.4 mg/dL     Total Cholesterol: 160 mg/dL  Other depression with emotional eating.  She has been working on behavior modification techniques to help reduce her emotional eating and has been somewhat successful. She shows no sign of suicidal or homicidal ideations. Bupropion was started at her last visit. She feels it is helping with cravings and denies side effects other than a "tightened" abdomen when hungry. She feels like she has a bit more energy.  At increased risk of exposure to COVID-19 virus. The patient is at higher risk of COVID-19 infection due to higher infection rates locally. She is following all measures and is planning on getting her COVID vaccine when the time comes.  Assessment/Plan:   Vitamin D deficiency. Low Vitamin D level contributes to fatigue and are associated with obesity, breast, and colon cancer. She agrees to continue to take OTC  Vitamin D 2,000 and will have routine testing of VITAMIN D 25 Hydroxy (Vit-D Deficiency, Fractures) today.  Insulin resistance.  Ashlee Swanson will continue to work on weight loss, exercise, and decreasing simple carbohydrates to help decrease the risk of diabetes. Ashlee Swanson agreed to follow-up with [place] as directed to closely monitor her progress. Comprehensive metabolic panel, Hemoglobin A1c, Insulin, random labs ordered.  Other hyperlipidemia.  Cardiovascular risk and specific lipid/LDL goals reviewed.  We discussed several lifestyle modifications today and Ahriyah will continue to work on diet, exercise and weight loss efforts. Orders and follow up as documented in patient record. Lipid Panel With LDL/HDL Ratio labs ordered today.     Other depression with emotional eating.  Behavior modification techniques were discussed today to help Nikela deal with her emotional/non-hunger eating behaviors.  Orders and follow up as documented in patient record. Refill as given for buPROPion (WELLBUTRIN SR) 150 MG 12 hr tablet QD #30 with 0  refills.  At increased risk of exposure to COVID-19 virus. Ashlee Swanson was given approximately 15 minutes of COVID prevention counseling today. She was encouraged to get her COVID vaccine. She will follow all measures.  Counseling  COVID-19 is a respiratory infection that is caused by a virus. It can cause serious infections, such as pneumonia, acute respiratory distress syndrome, acute respiratory failure, or sepsis.  You are more likely to develop a serious illness if you are 71 years of age or older, have a weak immune system, live in a nursing home, have chronic disease, or have obesity.  Get vaccinated as soon as they are available to you.  For our most current information, please visit [email or web address].  Wash your hands often with soap and water for 20 seconds. If soap and water are not available, use alcohol-based hand sanitizer.  Wear a face mask. Make sure your mask covers your nose and mouth.  Maintain at least 6 feet distance from others when in public.    Class 1 obesity with serious comorbidity and body mass index (BMI) of 31.0 to 31.9 in adult, unspecified obesity type.  Ashlee Swanson is currently in the action stage of change. As such, her goal is to continue with weight loss efforts. She has agreed to the Category 2 Plan and journal 1200-1300 calories + 80 grams of protein daily.   Exercise goals: Ashlee Swanson will continue her current exercise regimen.  Behavioral modification strategies: planning for success.  Ashlee Swanson has agreed to follow-up with our clinic in 3 weeks. She was informed of the importance of frequent follow-up visits to maximize her success with intensive lifestyle modifications for her multiple health conditions.   Ashlee Swanson was informed we would discuss her lab results at her next visit unless there is a critical issue that needs to be addressed sooner. Ashlee Swanson agreed to keep her next visit at the agreed upon time to discuss these results.  Objective:   Blood  pressure 134/66, pulse 66, temperature 98.3 F (36.8 C), temperature source Oral, height 5\' 4"  (1.626 m), weight 182 lb (82.6 kg), SpO2 99 %. Body mass index is 31.24 kg/m.  General: Cooperative, alert, well developed, in no acute distress. HEENT: Conjunctivae and lids unremarkable. Cardiovascular: Regular rhythm.  Lungs: Normal work of breathing. Neurologic: No focal deficits.   Lab Results  Component Value Date   CREATININE 0.84 06/12/2019   BUN 14 06/12/2019   NA 139 06/12/2019   K 4.5 06/12/2019   CL 103 06/12/2019   CO2 29 06/12/2019   Lab Results  Component Value Date   ALT 9 06/12/2019   AST 12 06/12/2019   ALKPHOS 60 06/12/2019   BILITOT 0.4 06/12/2019   Lab Results  Component Value Date   HGBA1C 5.2 06/12/2019   HGBA1C 5.2 09/07/2018   Lab Results  Component Value Date   INSULIN 6.8 09/07/2018   Lab Results  Component Value Date   TSH 2.04 06/09/2018  Lab Results  Component Value Date   CHOL 160 06/12/2019   HDL 41.40 06/12/2019   LDLCALC 98 06/12/2019   TRIG 105.0 06/12/2019   CHOLHDL 4 06/12/2019   Lab Results  Component Value Date   WBC 7.9 06/09/2018   HGB 14.2 06/09/2018   HCT 40.7 06/09/2018   MCV 99.1 06/09/2018   PLT 353.0 06/09/2018   No results found for: IRON, TIBC, FERRITIN  Attestation Statements:   Reviewed by clinician on day of visit: allergies, medications, problem list, medical history, surgical history, family history, social history, and previous encounter notes.  IMichaelene Song, am acting as Location manager for [employer], FNP   I have reviewed the above documentation for accuracy and completeness, and I agree with the above. -  Georgianne Fick, FNP

## 2020-01-19 LAB — COMPREHENSIVE METABOLIC PANEL
ALT: 9 IU/L (ref 0–32)
AST: 16 IU/L (ref 0–40)
Albumin/Globulin Ratio: 1.6 (ref 1.2–2.2)
Albumin: 4.4 g/dL (ref 3.8–4.8)
Alkaline Phosphatase: 71 IU/L (ref 39–117)
BUN/Creatinine Ratio: 15 (ref 9–23)
BUN: 16 mg/dL (ref 6–24)
Bilirubin Total: 0.4 mg/dL (ref 0.0–1.2)
CO2: 24 mmol/L (ref 20–29)
Calcium: 9.9 mg/dL (ref 8.7–10.2)
Chloride: 104 mmol/L (ref 96–106)
Creatinine, Ser: 1.04 mg/dL — ABNORMAL HIGH (ref 0.57–1.00)
GFR calc Af Amer: 73 mL/min/{1.73_m2} (ref >59)
GFR calc non Af Amer: 63 mL/min/{1.73_m2} (ref >59)
Globulin, Total: 2.7 g/dL (ref 1.5–4.5)
Glucose: 91 mg/dL (ref 65–99)
Potassium: 4.7 mmol/L (ref 3.5–5.2)
Sodium: 141 mmol/L (ref 134–144)
Total Protein: 7.1 g/dL (ref 6.0–8.5)

## 2020-01-19 LAB — INSULIN, RANDOM: INSULIN: 7 u[IU]/mL (ref 2.6–24.9)

## 2020-01-19 LAB — LIPID PANEL WITH LDL/HDL RATIO
Cholesterol, Total: 197 mg/dL (ref 100–199)
HDL: 49 mg/dL (ref >39)
LDL Chol Calc (NIH): 132 mg/dL — ABNORMAL HIGH (ref 0–99)
LDL/HDL Ratio: 2.7 ratio (ref 0.0–3.2)
Triglycerides: 89 mg/dL (ref 0–149)
VLDL Cholesterol Cal: 16 mg/dL (ref 5–40)

## 2020-01-19 LAB — HEMOGLOBIN A1C
Est. average glucose Bld gHb Est-mCnc: 97 mg/dL
Hgb A1c MFr Bld: 5 % (ref 4.8–5.6)

## 2020-01-19 LAB — VITAMIN D 25 HYDROXY (VIT D DEFICIENCY, FRACTURES): Vit D, 25-Hydroxy: 33.8 ng/mL (ref 30.0–100.0)

## 2020-02-07 ENCOUNTER — Encounter (INDEPENDENT_AMBULATORY_CARE_PROVIDER_SITE_OTHER): Payer: Self-pay

## 2020-02-07 HISTORY — PX: SINUS LIFT WITH BONE GRAFT: SHX5306

## 2020-02-08 ENCOUNTER — Ambulatory Visit (INDEPENDENT_AMBULATORY_CARE_PROVIDER_SITE_OTHER): Payer: 59 | Admitting: Family Medicine

## 2020-02-14 ENCOUNTER — Other Ambulatory Visit: Payer: Self-pay | Admitting: Family Medicine

## 2020-02-14 ENCOUNTER — Other Ambulatory Visit: Payer: Self-pay

## 2020-02-14 MED ORDER — ESZOPICLONE 3 MG PO TABS: 3.0000 mg | ORAL_TABLET | Freq: Every evening | ORAL | 2 refills | Status: DC | PRN

## 2020-02-14 NOTE — Progress Notes (Unsigned)
**  Rx was printed. Please resend**  Requesting: Eszopiclone Contract: 06/09/2018 UDS: 06/09/2018 Last OV: 12/12/2019 Next OV: 06/11/20 Last Refill: 08/24/2019, #30--0 RF Database:   Please advise

## 2020-02-15 ENCOUNTER — Encounter (INDEPENDENT_AMBULATORY_CARE_PROVIDER_SITE_OTHER): Payer: Self-pay | Admitting: Family Medicine

## 2020-02-15 ENCOUNTER — Other Ambulatory Visit: Payer: Self-pay | Admitting: Family Medicine

## 2020-02-15 ENCOUNTER — Other Ambulatory Visit: Payer: Self-pay

## 2020-02-15 ENCOUNTER — Telehealth (INDEPENDENT_AMBULATORY_CARE_PROVIDER_SITE_OTHER): Payer: 59 | Admitting: Family Medicine

## 2020-02-15 DIAGNOSIS — E7849 Other hyperlipidemia: Secondary | ICD-10-CM

## 2020-02-15 DIAGNOSIS — E559 Vitamin D deficiency, unspecified: Secondary | ICD-10-CM | POA: Diagnosis not present

## 2020-02-15 DIAGNOSIS — E669 Obesity, unspecified: Secondary | ICD-10-CM | POA: Diagnosis not present

## 2020-02-15 DIAGNOSIS — F3289 Other specified depressive episodes: Secondary | ICD-10-CM | POA: Diagnosis not present

## 2020-02-15 DIAGNOSIS — Z6831 Body mass index (BMI) 31.0-31.9, adult: Secondary | ICD-10-CM

## 2020-02-15 MED ORDER — ESZOPICLONE 3 MG PO TABS: 3.0000 mg | ORAL_TABLET | Freq: Every evening | ORAL | 2 refills | Status: DC | PRN

## 2020-02-15 MED ORDER — VITAMIN D (ERGOCALCIFEROL) 1.25 MG (50000 UNIT) PO CAPS: 50000.0000 [IU] | ORAL_CAPSULE | ORAL | 0 refills | Status: DC

## 2020-02-15 MED ORDER — BUPROPION HCL ER (SR) 150 MG PO TB12: 150.0000 mg | ORAL_TABLET | Freq: Every day | ORAL | 0 refills | Status: DC

## 2020-02-15 NOTE — Patient Instructions (Signed)
The 10-year ASCVD risk score [name] DC [name], et al., 2013) is: 1.4%   Values used to calculate the score:     Age: 49 years     Sex: Female     Is Non-Hispanic African American: No     Diabetic: No     Tobacco smoker: No     Systolic Blood Pressure: Q000111Q mmHg     Is BP treated: No     HDL Cholesterol: 49 mg/dL     Total Cholesterol: 197 mg/dL

## 2020-02-15 NOTE — Progress Notes (Signed)
TeleHealth Visit:  Due to the COVID-19 pandemic, this visit was completed with telemedicine (audio/video) technology to reduce patient and provider exposure as well as to preserve personal protective equipment.   Ashlee Swanson has verbally consented to this TeleHealth visit. The patient is located at home, the provider is located at the [facility] office. The participants in this visit include the listed provider and patient. The visit was conducted today via Webex.  Chief Complaint: OBESITY Ashlee Swanson is here to discuss her progress with her obesity treatment plan along with follow-up of her obesity related diagnoses. Ashlee Swanson is on the Category 2 Plan and journaling 1200-1300 calories and 80 grams of protein and states she is following her eating plan approximately 80% of the time. Ashlee Swanson states she is doing yoga 60 minutes 2 times per week and cardio strengthening 30 minutes 2 times per week.   Today's visit was #: 28 Starting weight: 194 lbs Starting date: 09/07/2018  Interim History: Ashlee Swanson reports she has lost 1 lb and states weight today is 182 lbs on her home scale.  Subjective:   Vitamin D deficiency. Vitamin D level is not at goal and has decreased to 33.8 on 01/18/2020 on 2,000 IU Vitamin D daily. She admits to fatigue.  Other depression with emotional eating.  Ashlee Swanson feels bupropion is helping her to realize when she is actually hungry vs having a craving. She is taking the bupropion at noon because she has found it works better for her evening cravings if she takes it later in the day.. She denies insomnia.  Other hyperlipidemia. LDL has increased to 132 (previously 98). HDL also increased and is 49. Triglycerides within normal limits. 10-year ASCVD score is 1.4.   Lab Results  Component Value Date   CHOL 197 01/18/2020   HDL 49 01/18/2020   LDLCALC 132 (H) 01/18/2020   TRIG 89 01/18/2020   CHOLHDL 4 06/12/2019   Lab Results  Component Value Date    ALT 9 01/18/2020   AST 16 01/18/2020   ALKPHOS 71 01/18/2020   BILITOT 0.4 01/18/2020   The 10-year ASCVD risk score [name] DC Jr., et al., 2013) is: 1.4%   Values used to calculate the score:     Age: 49 years     Sex: Female     Is Non-Hispanic African American: No     Diabetic: No     Tobacco smoker: No     Systolic Blood Pressure: Q000111Q mmHg     Is BP treated: No     HDL Cholesterol: 49 mg/dL     Total Cholesterol: 197 mg/dL  Assessment/Plan:   Vitamin D deficiency. Low Vitamin D level contributes to fatigue and are associated with obesity, breast, and colon cancer. She was given a new prescription for Vitamin D, Ergocalciferol, (DRISDOL) 1.25 MG (50000 UNIT) CAPS capsule every week #4 with 0 refills and will follow-up for routine testing of Vitamin D, at least 2-3 times per year to avoid over-replacement.    Other depression with emotional eating.   She was given a refill on her buPROPion (WELLBUTRIN SR) 150 MG 12 hr tablet QAM #30 with 0 refills.  Other hyperlipidemia. Cardiovascular risk and specific lipid/LDL goals reviewed.  We discussed several lifestyle modifications today and Ashlee Swanson will continue to work on diet, exercise and weight loss efforts. Orders and follow up as documented in patient record. No statin is indicated. She will continue her meal plan.  Class 1 obesity with serious  comorbidity and body mass index (BMI) of 31.0 to 31.9 in adult, unspecified obesity type.  Ashlee Swanson is currently in the action stage of change. As such, her goal is to continue with weight loss efforts. She has agreed to the Category 2 Plan or journal 1200-1300 calories + 80 grams of protein daily.   Exercise goals: Ashlee Swanson will continue her current exercise regimen.  Behavioral modification strategies: increasing lean protein intake, decreasing simple carbohydrates and planning for success.  Ashlee Swanson has agreed to follow-up with our clinic in 2 weeks. She was informed of the importance of  frequent follow-up visits to maximize her success with intensive lifestyle modifications for her multiple health conditions.  Objective:   VITALS: Per patient if applicable, see vitals. GENERAL: Alert and in no acute distress. CARDIOPULMONARY: No increased WOB. Speaking in clear sentences.  PSYCH: Pleasant and cooperative. Speech normal rate and rhythm. Affect is appropriate. Insight and judgement are appropriate. Attention is focused, linear, and appropriate.  NEURO: Oriented as arrived to appointment on time with no prompting.   Lab Results  Component Value Date   CREATININE 1.04 (H) 01/18/2020   BUN 16 01/18/2020   NA 141 01/18/2020   K 4.7 01/18/2020   CL 104 01/18/2020   CO2 24 01/18/2020   Lab Results  Component Value Date   ALT 9 01/18/2020   AST 16 01/18/2020   ALKPHOS 71 01/18/2020   BILITOT 0.4 01/18/2020   Lab Results  Component Value Date   HGBA1C 5.0 01/18/2020   HGBA1C 5.2 06/12/2019   HGBA1C 5.2 09/07/2018   Lab Results  Component Value Date   INSULIN 7.0 01/18/2020   INSULIN 6.8 09/07/2018   Lab Results  Component Value Date   TSH 2.04 06/09/2018   Lab Results  Component Value Date   CHOL 197 01/18/2020   HDL 49 01/18/2020   LDLCALC 132 (H) 01/18/2020   TRIG 89 01/18/2020   CHOLHDL 4 06/12/2019   Lab Results  Component Value Date   WBC 7.9 06/09/2018   HGB 14.2 06/09/2018   HCT 40.7 06/09/2018   MCV 99.1 06/09/2018   PLT 353.0 06/09/2018   No results found for: IRON, TIBC, FERRITIN  Attestation Statements:   Reviewed by clinician on day of visit: allergies, medications, problem list, medical history, surgical history, family history, social history, and previous encounter notes.  IMichaelene Song, am acting as Location manager for [employer], FNP   I have reviewed the above documentation for accuracy and completeness, and I agree with the above. - Georgianne Fick, FNP

## 2020-02-29 ENCOUNTER — Ambulatory Visit (INDEPENDENT_AMBULATORY_CARE_PROVIDER_SITE_OTHER): Payer: 59 | Admitting: Family Medicine

## 2020-02-29 ENCOUNTER — Other Ambulatory Visit: Payer: Self-pay

## 2020-02-29 ENCOUNTER — Encounter (INDEPENDENT_AMBULATORY_CARE_PROVIDER_SITE_OTHER): Payer: Self-pay | Admitting: Family Medicine

## 2020-02-29 VITALS — BP 118/72 | HR 66 | Temp 98.2°F | Ht 64.0 in | Wt 181.0 lb

## 2020-02-29 DIAGNOSIS — E669 Obesity, unspecified: Secondary | ICD-10-CM | POA: Diagnosis not present

## 2020-02-29 DIAGNOSIS — Z6831 Body mass index (BMI) 31.0-31.9, adult: Secondary | ICD-10-CM | POA: Diagnosis not present

## 2020-02-29 DIAGNOSIS — F3289 Other specified depressive episodes: Secondary | ICD-10-CM

## 2020-03-04 ENCOUNTER — Encounter (INDEPENDENT_AMBULATORY_CARE_PROVIDER_SITE_OTHER): Payer: Self-pay | Admitting: Family Medicine

## 2020-03-04 NOTE — Progress Notes (Signed)
Chief Complaint:   OBESITY Ashlee Swanson is here to discuss her progress with her obesity treatment plan along with follow-up of her obesity related diagnoses. Ashlee Swanson is on keeping a food journal and adhering to recommended goals of 1200-1300 calories and 80 grams of protein daily and states she is following her eating plan approximately 75% of the time. Ashlee Swanson states she is doing yoga and cardio for 60-90 minutes 4 times per week.  Today's visit was #: 50  Starting weight: 194 lbs Starting date: 09/07/2018 Today's weight: 181 lbs Today's date: 02/29/2020 Total lbs lost to date: 13 Total lbs lost since last in-office visit: 1  Interim History: Ashlee Swanson denies excessive hunger. She is following Category 2 and journaling the food she eats. She meets her protein goal daily.  Subjective:   1. Other depression with emotional eating  Ashlee Swanson notes bupropion is still helping with cravings. Mood is stable.  Assessment/Plan:   1. Other depression with emotional eating  Behavior modification techniques were discussed today to help Ashlee Swanson deal with her emotional/non-hunger eating behaviors. Ashlee Swanson agreed to continue bupropion. Orders and follow up as documented in patient record.   2. Class 1 obesity with serious comorbidity and body mass index (BMI) of 31.0 to 31.9 in adult, unspecified obesity type Ashlee Swanson is currently in the action stage of change. As such, her goal is to continue with weight loss efforts. She has agreed to the Category 2 Plan or keeping a food journal and adhering to recommended goals of 1200-1300 calories and 80 grams of protein daily.   Exercise goals: As is.  Behavioral modification strategies: increasing lean protein intake, decreasing simple carbohydrates and keeping a strict food journal.  Ashlee Swanson has agreed to follow-up with our clinic in 2 to 3 weeks. She was informed of the importance of frequent follow-up visits to maximize her success with intensive lifestyle  modifications.  Objective:   Blood pressure 118/72, pulse 66, temperature 98.2 F (36.8 C), temperature source Oral, height 5\' 4"  (1.626 m), weight 181 lb (82.1 kg), SpO2 98 %. Body mass index is 31.07 kg/m.  General: Cooperative, alert, well developed, in no acute distress. HEENT: Conjunctivae and lids unremarkable. Cardiovascular: Regular rhythm.  Lungs: Normal work of breathing. Neurologic: No focal deficits.   Lab Results  Component Value Date   CREATININE 1.04 (H) 01/18/2020   BUN 16 01/18/2020   NA 141 01/18/2020   K 4.7 01/18/2020   CL 104 01/18/2020   CO2 24 01/18/2020   Lab Results  Component Value Date   ALT 9 01/18/2020   AST 16 01/18/2020   ALKPHOS 71 01/18/2020   BILITOT 0.4 01/18/2020   Lab Results  Component Value Date   HGBA1C 5.0 01/18/2020   HGBA1C 5.2 06/12/2019   HGBA1C 5.2 09/07/2018   Lab Results  Component Value Date   INSULIN 7.0 01/18/2020   INSULIN 6.8 09/07/2018   Lab Results  Component Value Date   TSH 2.04 06/09/2018   Lab Results  Component Value Date   CHOL 197 01/18/2020   HDL 49 01/18/2020   LDLCALC 132 (H) 01/18/2020   TRIG 89 01/18/2020   CHOLHDL 4 06/12/2019   Lab Results  Component Value Date   WBC 7.9 06/09/2018   HGB 14.2 06/09/2018   HCT 40.7 06/09/2018   MCV 99.1 06/09/2018   PLT 353.0 06/09/2018   No results found for: IRON, TIBC, FERRITIN  Attestation Statements:   Reviewed by clinician on day of visit: allergies, medications, problem  list, medical history, surgical history, family history, social history, and previous encounter notes.   Wilhemena Durie, am acting as Location manager for [employer], FNP-C.  I have reviewed the above documentation for accuracy and completeness, and I agree with the above. -  Georgianne Fick, FNP

## 2020-03-11 ENCOUNTER — Other Ambulatory Visit (INDEPENDENT_AMBULATORY_CARE_PROVIDER_SITE_OTHER): Payer: Self-pay | Admitting: Family Medicine

## 2020-03-11 DIAGNOSIS — F3289 Other specified depressive episodes: Secondary | ICD-10-CM

## 2020-03-18 ENCOUNTER — Other Ambulatory Visit: Payer: Self-pay

## 2020-03-18 ENCOUNTER — Ambulatory Visit (INDEPENDENT_AMBULATORY_CARE_PROVIDER_SITE_OTHER): Payer: 59 | Admitting: Family Medicine

## 2020-03-18 VITALS — BP 143/84 | HR 73 | Temp 98.2°F | Ht 64.0 in | Wt 180.0 lb

## 2020-03-18 DIAGNOSIS — E669 Obesity, unspecified: Secondary | ICD-10-CM

## 2020-03-18 DIAGNOSIS — Z6830 Body mass index (BMI) 30.0-30.9, adult: Secondary | ICD-10-CM | POA: Diagnosis not present

## 2020-03-18 DIAGNOSIS — F3289 Other specified depressive episodes: Secondary | ICD-10-CM | POA: Diagnosis not present

## 2020-03-18 DIAGNOSIS — E559 Vitamin D deficiency, unspecified: Secondary | ICD-10-CM

## 2020-03-18 MED ORDER — VITAMIN D (ERGOCALCIFEROL) 1.25 MG (50000 UNIT) PO CAPS: 50000.0000 [IU] | ORAL_CAPSULE | ORAL | 0 refills | Status: DC

## 2020-03-19 ENCOUNTER — Encounter (INDEPENDENT_AMBULATORY_CARE_PROVIDER_SITE_OTHER): Payer: Self-pay | Admitting: Family Medicine

## 2020-03-19 NOTE — Progress Notes (Signed)
Chief Complaint:   OBESITY Ashlee Swanson is here to discuss her progress with her obesity treatment plan along with follow-up of her obesity related diagnoses. Ashlee Swanson is on the Category 2 Plan or keeping a food journal and adhering to recommended goals of 1200-1300 calories and 80 grams of protein daily and states she is following her eating plan approximately 70% of the time. Ashlee Swanson states she is doing yoga and cardio for 30-60 minutes 2-4 times per week.  Today's visit was #: 30 Starting weight: 194 lbs Starting date: 09/07/2018 Today's weight: 180 lbs Today's date: 03/18/2020 Total lbs lost to date: 14 Total lbs lost since last in-office visit: 1  Interim History: [name] notes she has not stuck to the plan as well as usual. She is using [employer] a few days per week for dinner.  Subjective:   1. Vitamin D deficiency Ashlee Swanson's Vit D level is not at goal. Last Vit D level was 33.8. She is on prescription Vit D.  2. Other depression with emotional eating  Ashlee Swanson's cravings are well controlled on bupropion and she feels this works well.  Assessment/Plan:   1. Vitamin D deficiency Low Vitamin D level contributes to fatigue and are associated with obesity, breast, and colon cancer. We will refill prescription Vitamin D for 1 month. [name] will follow-up for routine testing of Vitamin D, at least 2-3 times per year to avoid over-replacement.  - Vitamin D, Ergocalciferol, (DRISDOL) 1.25 MG (50000 UNIT) CAPS capsule; Take 1 capsule (50,000 Units total) by mouth every 7 (seven) days.  Dispense: 4 capsule; Refill: 0  2. Other depression with emotional eating   [name] agreed to continue bupropion. Orders and follow up as documented in patient record.   3. Class 1 obesity with serious comorbidity and body mass index (BMI) of 30.0 to 30.9 in adult, unspecified obesity type [name] is currently in the action stage of change. As such, her goal is to continue with weight loss efforts. She has  agreed to the Category 2 Plan or keeping a food journal and adhering to recommended goals of 1200-1300 calories and 85 grams of protein daily.   Exercise goals: As is.  Behavioral modification strategies: better snacking choices, dealing with family or coworker sabotage, avoiding temptations and planning for success.  Ashlee Swanson has agreed to follow-up with our clinic in 3 weeks. She was informed of the importance of frequent follow-up visits to maximize her success with intensive lifestyle modifications for her multiple health conditions.   Objective:   Blood pressure (!) 143/84, pulse 73, temperature 98.2 F (36.8 C), temperature source Oral, height 5\' 4"  (1.626 m), weight 180 lb (81.6 kg), SpO2 98 %. Body mass index is 30.9 kg/m.  General: Cooperative, alert, well developed, in no acute distress. HEENT: Conjunctivae and lids unremarkable. Cardiovascular: Regular rhythm.  Lungs: Normal work of breathing. Neurologic: No focal deficits.   Lab Results  Component Value Date   CREATININE 1.04 (H) 01/18/2020   BUN 16 01/18/2020   NA 141 01/18/2020   K 4.7 01/18/2020   CL 104 01/18/2020   CO2 24 01/18/2020   Lab Results  Component Value Date   ALT 9 01/18/2020   AST 16 01/18/2020   ALKPHOS 71 01/18/2020   BILITOT 0.4 01/18/2020   Lab Results  Component Value Date   HGBA1C 5.0 01/18/2020   HGBA1C 5.2 06/12/2019   HGBA1C 5.2 09/07/2018   Lab Results  Component Value Date   INSULIN 7.0 01/18/2020   INSULIN  6.8 09/07/2018   Lab Results  Component Value Date   TSH 2.04 06/09/2018   Lab Results  Component Value Date   CHOL 197 01/18/2020   HDL 49 01/18/2020   LDLCALC 132 (H) 01/18/2020   TRIG 89 01/18/2020   CHOLHDL 4 06/12/2019   Lab Results  Component Value Date   WBC 7.9 06/09/2018   HGB 14.2 06/09/2018   HCT 40.7 06/09/2018   MCV 99.1 06/09/2018   PLT 353.0 06/09/2018   No results found for: IRON, TIBC, FERRITIN  Attestation Statements:   Reviewed by  clinician on day of visit: allergies, medications, problem list, medical history, surgical history, family history, social history, and previous encounter notes.   Wilhemena Durie, am acting as Location manager for [employer], FNP-C.  I have reviewed the above documentation for accuracy and completeness, and I agree with the above. -  Georgianne Fick, FNP

## 2020-03-25 ENCOUNTER — Other Ambulatory Visit (INDEPENDENT_AMBULATORY_CARE_PROVIDER_SITE_OTHER): Payer: Self-pay | Admitting: Family Medicine

## 2020-03-25 DIAGNOSIS — F3289 Other specified depressive episodes: Secondary | ICD-10-CM

## 2020-03-25 MED ORDER — BUPROPION HCL ER (SR) 150 MG PO TB12: 150.0000 mg | ORAL_TABLET | Freq: Every day | ORAL | 0 refills | Status: DC

## 2020-03-26 ENCOUNTER — Encounter: Payer: Self-pay | Admitting: Family Medicine

## 2020-03-28 ENCOUNTER — Ambulatory Visit: Payer: 59 | Attending: Internal Medicine

## 2020-03-28 DIAGNOSIS — Z23 Encounter for immunization: Secondary | ICD-10-CM

## 2020-03-28 NOTE — Progress Notes (Signed)
   Covid-19 Vaccination Clinic  Name:  Ashlee Swanson    MRN: [id number] DOB: [birth year]  03/28/2020  [name] was observed post Covid-19 immunization for 15 minutes without incident. She was provided with Vaccine Information Sheet and instruction to access the V-Safe system.   [name] was instructed to call 911 with any severe reactions post vaccine: Marland Kitchen Difficulty breathing  . Swelling of face and throat  . A fast heartbeat  . A bad rash all over body  . Dizziness and weakness   Immunizations Administered    Name Date Dose VIS Date Route   Pfizer COVID-19 Vaccine 03/28/2020 12:35 PM 0.3 mL 12/01/2019 Intramuscular   Manufacturer: [place]   Lot: [id number]   [place]: [id number]

## 2020-04-08 ENCOUNTER — Encounter (INDEPENDENT_AMBULATORY_CARE_PROVIDER_SITE_OTHER): Payer: Self-pay | Admitting: Family Medicine

## 2020-04-08 ENCOUNTER — Other Ambulatory Visit: Payer: Self-pay

## 2020-04-08 ENCOUNTER — Ambulatory Visit (INDEPENDENT_AMBULATORY_CARE_PROVIDER_SITE_OTHER): Payer: 59 | Admitting: Family Medicine

## 2020-04-08 VITALS — BP 167/78 | HR 72 | Temp 98.2°F | Ht 64.0 in | Wt 181.0 lb

## 2020-04-08 DIAGNOSIS — E559 Vitamin D deficiency, unspecified: Secondary | ICD-10-CM | POA: Diagnosis not present

## 2020-04-08 DIAGNOSIS — E669 Obesity, unspecified: Secondary | ICD-10-CM

## 2020-04-08 DIAGNOSIS — F3289 Other specified depressive episodes: Secondary | ICD-10-CM | POA: Diagnosis not present

## 2020-04-08 DIAGNOSIS — Z9189 Other specified personal risk factors, not elsewhere classified: Secondary | ICD-10-CM | POA: Diagnosis not present

## 2020-04-08 DIAGNOSIS — I10 Essential (primary) hypertension: Secondary | ICD-10-CM

## 2020-04-08 DIAGNOSIS — Z6831 Body mass index (BMI) 31.0-31.9, adult: Secondary | ICD-10-CM

## 2020-04-08 MED ORDER — VITAMIN D (ERGOCALCIFEROL) 1.25 MG (50000 UNIT) PO CAPS: 50000.0000 [IU] | ORAL_CAPSULE | ORAL | 0 refills | Status: DC

## 2020-04-08 MED ORDER — BUPROPION HCL ER (SR) 150 MG PO TB12: 150.0000 mg | ORAL_TABLET | Freq: Every day | ORAL | 0 refills | Status: DC

## 2020-04-09 NOTE — Progress Notes (Signed)
Chief Complaint:   OBESITY Ashlee Swanson is here to discuss her progress with her obesity treatment plan along with follow-up of her obesity related diagnoses. Ashlee Swanson is on the Category 2 Plan and keeping a food journal and adhering to recommended goals of 1200-1300 calories and 85 grams of protein daily and states she is following her eating plan approximately 60% of the time. Ashlee Swanson states she is doing 0 minutes 0 times per week.  Today's visit was #: 53 Starting weight: 194 lbs Starting date: 09/07/2018 Today's weight: 181 lbs Today's date: 04/08/2020 Total lbs lost to date: 13 Total lbs lost since last in-office visit: 0  Interim History: Ashlee Swanson notes she has been off the plan due to Easter. She also notes increased fatigue due to flare of her allergies. She has not been exercising.  She has not been journaling consistently. She reports that she is now back on the plan.  Subjective:   1. Essential hypertension Ashlee Swanson is not on antihypertensives. Blood pressure is elevated today. BP Readings from Last 3 Encounters:  04/08/20 (!) 167/78  03/18/20 (!) 143/84  02/29/20 118/72     2. Vitamin D deficiency Ashlee Swanson is on OTC Vit D daily and prescription Vit D.  3. Other depression with emotional eating  Ashlee Swanson feels bupropion is helping with hunger and cravings.  4. At risk for osteoporosis Ashlee Swanson is at higher risk of osteopenia and osteoporosis due to family history of osteoporosis. She is taking calcium daily.  Assessment/Plan:   1. Essential hypertension Ashlee Swanson is working on healthy weight loss and exercise to improve blood pressure control. We will continue to monitor and will watch for signs of hypotension as she continues her lifestyle modifications.  2. Vitamin D deficiency Low Vitamin D level contributes to fatigue and are associated with obesity, breast, and colon cancer. We will refill prescription Vitamin D for 1 month. Ashlee Swanson will follow-up for routine testing of  Vitamin D, at least 2-3 times per year to avoid over-replacement.  - Vitamin D, Ergocalciferol, (DRISDOL) 1.25 MG (50000 UNIT) CAPS capsule; Take 1 capsule (50,000 Units total) by mouth every 7 (seven) days.  Dispense: 4 capsule; Refill: 0  3. Other depression with emotional eating  Behavior modification techniques were discussed today to help Ashlee Swanson deal with her emotional/non-hunger eating behaviors. We will refill bupropion SR for 1 month. Orders and follow up as documented in patient record.   - buPROPion (WELLBUTRIN SR) 150 MG 12 hr tablet; Take 1 tablet (150 mg total) by mouth daily.  Dispense: 30 tablet; Refill: 0  4. At risk for osteoporosis Ashlee Swanson was given approximately 15 minutes of osteoporosis prevention counseling today. Ashlee Swanson is at risk for osteopenia and osteoporosis due to her Vitamin D deficiency. She was encouraged to take her Vitamin D and follow her higher calcium diet and increase strengthening exercise to help strengthen her bones and decrease her risk of osteopenia and osteoporosis.  Repetitive spaced learning was employed today to elicit superior memory formation and behavioral change.  5. Class 1 obesity with serious comorbidity and body mass index (BMI) of 31.0 to 31.9 in adult, unspecified obesity type Ashlee Swanson is currently in the action stage of change. As such, her goal is to continue with weight loss efforts. She has agreed to the Category 2 Plan or keeping a food journal and adhering to recommended goals of 1200-1300 calories and 85 grams of protein daily.   Exercise goals: Ashlee Swanson will start back exercising.  Behavioral modification strategies: increasing lean  protein intake, decreasing simple carbohydrates and planning for success.  Ashlee Swanson has agreed to follow-up with our clinic in 3 weeks. She was informed of the importance of frequent follow-up visits to maximize her success with intensive lifestyle modifications for her multiple health conditions.    Objective:   Blood pressure (!) 167/78, pulse 72, temperature 98.2 F (36.8 C), temperature source Oral, height 5\' 4"  (1.626 m), weight 181 lb (82.1 kg), SpO2 96 %. Body mass index is 31.07 kg/m.  General: Cooperative, alert, well developed, in no acute distress. HEENT: Conjunctivae and lids unremarkable. Cardiovascular: Regular rhythm.  Lungs: Normal work of breathing. Neurologic: No focal deficits.   Lab Results  Component Value Date   CREATININE 1.04 (H) 01/18/2020   BUN 16 01/18/2020   NA 141 01/18/2020   K 4.7 01/18/2020   CL 104 01/18/2020   CO2 24 01/18/2020   Lab Results  Component Value Date   ALT 9 01/18/2020   AST 16 01/18/2020   ALKPHOS 71 01/18/2020   BILITOT 0.4 01/18/2020   Lab Results  Component Value Date   HGBA1C 5.0 01/18/2020   HGBA1C 5.2 06/12/2019   HGBA1C 5.2 09/07/2018   Lab Results  Component Value Date   INSULIN 7.0 01/18/2020   INSULIN 6.8 09/07/2018   Lab Results  Component Value Date   TSH 2.04 06/09/2018   Lab Results  Component Value Date   CHOL 197 01/18/2020   HDL 49 01/18/2020   LDLCALC 132 (H) 01/18/2020   TRIG 89 01/18/2020   CHOLHDL 4 06/12/2019   Lab Results  Component Value Date   WBC 7.9 06/09/2018   HGB 14.2 06/09/2018   HCT 40.7 06/09/2018   MCV 99.1 06/09/2018   PLT 353.0 06/09/2018   No results found for: IRON, TIBC, FERRITIN  Attestation Statements:   Reviewed by clinician on day of visit: allergies, medications, problem list, medical history, surgical history, family history, social history, and previous encounter notes.   Ashlee Swanson, am acting as Location manager for [employer], FNP-C.  I have reviewed the above documentation for accuracy and completeness, and I agree with the above. -  Ashlee Fick, FNP

## 2020-04-10 ENCOUNTER — Encounter (INDEPENDENT_AMBULATORY_CARE_PROVIDER_SITE_OTHER): Payer: Self-pay | Admitting: Family Medicine

## 2020-04-22 ENCOUNTER — Ambulatory Visit: Payer: 59 | Attending: Internal Medicine

## 2020-04-22 DIAGNOSIS — Z23 Encounter for immunization: Secondary | ICD-10-CM

## 2020-04-22 NOTE — Progress Notes (Signed)
   Covid-19 Vaccination Clinic  Name:  SOLYMAR TREES    MRN: [id number] DOB: 12-31-1970  04/22/2020  [name] was observed post Covid-19 immunization for 15 minutes without incident. She was provided with Vaccine Information Sheet and instruction to access the V-Safe system.   Ms. Inzer was instructed to call 911 with any severe reactions post vaccine: Marland Kitchen Difficulty breathing  . Swelling of face and throat  . A fast heartbeat  . A bad rash all over body  . Dizziness and weakness   Immunizations Administered    Name Date Dose VIS Date Route   Pfizer COVID-19 Vaccine 04/22/2020  9:18 AM 0.3 mL 02/14/2019 Intramuscular   Manufacturer: [place]   Lot: [id number]   [place]: [id number]

## 2020-04-29 ENCOUNTER — Other Ambulatory Visit: Payer: Self-pay

## 2020-04-29 ENCOUNTER — Ambulatory Visit (INDEPENDENT_AMBULATORY_CARE_PROVIDER_SITE_OTHER): Payer: 59 | Admitting: Family Medicine

## 2020-04-29 ENCOUNTER — Encounter (INDEPENDENT_AMBULATORY_CARE_PROVIDER_SITE_OTHER): Payer: Self-pay | Admitting: Family Medicine

## 2020-04-29 VITALS — BP 130/78 | HR 70 | Temp 97.8°F | Ht 64.0 in | Wt 180.0 lb

## 2020-04-29 DIAGNOSIS — I10 Essential (primary) hypertension: Secondary | ICD-10-CM | POA: Diagnosis not present

## 2020-04-29 DIAGNOSIS — F3289 Other specified depressive episodes: Secondary | ICD-10-CM

## 2020-04-29 DIAGNOSIS — E669 Obesity, unspecified: Secondary | ICD-10-CM

## 2020-04-29 DIAGNOSIS — Z9189 Other specified personal risk factors, not elsewhere classified: Secondary | ICD-10-CM

## 2020-04-29 DIAGNOSIS — Z6830 Body mass index (BMI) 30.0-30.9, adult: Secondary | ICD-10-CM

## 2020-04-29 MED ORDER — BUPROPION HCL ER (SR) 200 MG PO TB12: 200.0000 mg | ORAL_TABLET | Freq: Every day | ORAL | 0 refills | Status: DC

## 2020-04-29 NOTE — Progress Notes (Signed)
Chief Complaint:   OBESITY Ashlee Swanson is here to discuss her progress with her obesity treatment plan along with follow-up of her obesity related diagnoses. Ashlee Swanson is on the Category 2 Plan or keeping a food journal and adhering to recommended goals of 1200-1300 calories and 85 grams of protein daily and states she is following her eating plan approximately 70% of the time. Ashlee Swanson states she is doing yoga for 60 minutes 2 times per week.  Today's visit was #: 72 Starting weight: 194 lbs Starting date: 09/07/2018 Today's weight: 180 lbs Today's date: 04/29/2020 Total lbs lost to date: 14 Total lbs lost since last in-office visit: 1  Interim History: Ashlee Swanson notes she is eating more at night and not exercising as much as she had been. She is sticking to the plan well at breakfast and lunch. She has snacks in the home for her 67 yo son that are tempting to her.   Subjective:   1. Essential hypertension Ashlee Swanson's blood pressure is better today. It had been elevated the last 2 visits. She denies headaches, chest pain, or shortness of breath. She is working on decreasing salt. BP Readings from Last 3 Encounters:  04/29/20 130/78  04/08/20 (!) 167/78  03/18/20 (!) 143/84    2. Other depression with emotional eating  Ashlee Swanson notes increased night cravings. She is trying to eat healthy but notes increased stress in her life.  3. At risk for heart disease Ashlee Swanson is at a higher than average risk for cardiovascular disease due to obesity, hypertension, and family history of stroke and myocardial infarction.  Assessment/Plan:   1. Essential hypertension Ashlee Swanson is working on healthy weight loss and exercise to improve blood pressure control. We will continue to monitor.  2. Other depression with emotional eating  Behavior modification techniques were discussed today to help Ashlee Swanson deal with her emotional/non-hunger eating behaviors. Ashlee Swanson agreed to increase bupropion SR to 200 mg q daily with  no refills. Orders and follow up as documented in patient record.   - buPROPion (WELLBUTRIN SR) 200 MG 12 hr tablet; Take 1 tablet (200 mg total) by mouth daily.  Dispense: 30 tablet; Refill: 0  3. At risk for heart disease Ashlee Swanson was given approximately 15 minutes of coronary artery disease prevention counseling today. She is 49 y.o. female and has risk factors for heart disease including obesity, hypertension, and family history of stroke and myocardial infarction. We discussed intensive lifestyle modifications today with an emphasis on specific weight loss instructions and strategies.   Repetitive spaced learning was employed today to elicit superior memory formation and behavioral change.  4. Class 1 obesity with serious comorbidity and body mass index (BMI) of 30.0 to 30.9 in adult, unspecified obesity type Ashlee Swanson is currently in the action stage of change. As such, her goal is to continue with weight loss efforts. She has agreed to the Category 2 Plan.   Exercise goals: Ashlee Swanson is to add cardio 1 time per week.  Behavioral modification strategies: emotional eating strategies.  Ashlee Swanson has agreed to follow-up with our clinic in 3 weeks. She was informed of the importance of frequent follow-up visits to maximize her success with intensive lifestyle modifications for her multiple health conditions.   Objective:   Blood pressure 130/78, pulse 70, temperature 97.8 F (36.6 C), temperature source Oral, height 5\' 4"  (1.626 m), weight 180 lb (81.6 kg), SpO2 97 %. Body mass index is 30.9 kg/m.  General: Cooperative, alert, well developed, in no acute distress. HEENT:  Conjunctivae and lids unremarkable. Cardiovascular: Regular rhythm.  Lungs: Normal work of breathing. Neurologic: No focal deficits.   Lab Results  Component Value Date   CREATININE 1.04 (H) 01/18/2020   BUN 16 01/18/2020   NA 141 01/18/2020   K 4.7 01/18/2020   CL 104 01/18/2020   CO2 24 01/18/2020   Lab Results   Component Value Date   ALT 9 01/18/2020   AST 16 01/18/2020   ALKPHOS 71 01/18/2020   BILITOT 0.4 01/18/2020   Lab Results  Component Value Date   HGBA1C 5.0 01/18/2020   HGBA1C 5.2 06/12/2019   HGBA1C 5.2 09/07/2018   Lab Results  Component Value Date   INSULIN 7.0 01/18/2020   INSULIN 6.8 09/07/2018   Lab Results  Component Value Date   TSH 2.04 06/09/2018   Lab Results  Component Value Date   CHOL 197 01/18/2020   HDL 49 01/18/2020   LDLCALC 132 (H) 01/18/2020   TRIG 89 01/18/2020   CHOLHDL 4 06/12/2019   Lab Results  Component Value Date   WBC 7.9 06/09/2018   HGB 14.2 06/09/2018   HCT 40.7 06/09/2018   MCV 99.1 06/09/2018   PLT 353.0 06/09/2018   No results found for: IRON, TIBC, FERRITIN  Attestation Statements:   Reviewed by clinician on day of visit: allergies, medications, problem list, medical history, surgical history, family history, social history, and previous encounter notes.   Wilhemena Durie, am acting as Location manager for [employer], FNP-C.  I have reviewed the above documentation for accuracy and completeness, and I agree with the above. -  Georgianne Fick, FNP

## 2020-05-06 ENCOUNTER — Other Ambulatory Visit (INDEPENDENT_AMBULATORY_CARE_PROVIDER_SITE_OTHER): Payer: Self-pay | Admitting: Family Medicine

## 2020-05-06 DIAGNOSIS — E559 Vitamin D deficiency, unspecified: Secondary | ICD-10-CM

## 2020-05-21 ENCOUNTER — Ambulatory Visit (INDEPENDENT_AMBULATORY_CARE_PROVIDER_SITE_OTHER): Payer: 59 | Admitting: Family Medicine

## 2020-05-21 ENCOUNTER — Other Ambulatory Visit: Payer: Self-pay

## 2020-05-21 ENCOUNTER — Encounter (INDEPENDENT_AMBULATORY_CARE_PROVIDER_SITE_OTHER): Payer: Self-pay | Admitting: Family Medicine

## 2020-05-21 VITALS — BP 141/54 | HR 78 | Temp 98.7°F | Ht 64.0 in | Wt 179.0 lb

## 2020-05-21 DIAGNOSIS — E669 Obesity, unspecified: Secondary | ICD-10-CM

## 2020-05-21 DIAGNOSIS — F3289 Other specified depressive episodes: Secondary | ICD-10-CM | POA: Diagnosis not present

## 2020-05-21 DIAGNOSIS — Z6830 Body mass index (BMI) 30.0-30.9, adult: Secondary | ICD-10-CM | POA: Diagnosis not present

## 2020-05-21 DIAGNOSIS — E559 Vitamin D deficiency, unspecified: Secondary | ICD-10-CM

## 2020-05-21 MED ORDER — VITAMIN D (ERGOCALCIFEROL) 1.25 MG (50000 UNIT) PO CAPS: 50000.0000 [IU] | ORAL_CAPSULE | ORAL | 0 refills | Status: DC

## 2020-05-21 MED ORDER — BUPROPION HCL ER (SR) 200 MG PO TB12: 200.0000 mg | ORAL_TABLET | Freq: Every day | ORAL | 0 refills | Status: DC

## 2020-05-21 NOTE — Progress Notes (Signed)
Chief Complaint:   OBESITY Ashlee Swanson is here to discuss her progress with her obesity treatment plan along with follow-up of her obesity related diagnoses. Ashlee Swanson is on the Category 2 Plan and states she is following her eating plan approximately 75% of the time. Ashlee Swanson states she is doing yoga for 60 minutes 2 times per week, and cardio for 30 minutes 1 time per week.  Today's visit was #: 55 Starting weight: 194 lbs Starting date: 09/07/2018 Today's weight: 179 lbs Today's date: 05/21/2020 Total lbs lost to date: 15 Total lbs lost since last in-office visit: 1  Interim History: Ashlee Swanson feels she is doing well on the plan. She is eating plenty of protein. Her hunger is well controlled. She is working on adding more cardio to her exercise regimen.  Subjective:   1. Vitamin D deficiency Ashlee Swanson is on OTC Vit D 2,000 IU and prescription Vit D. Her last Vit D level was low at 33.8.  2. Other depression with emotional eating  Ashlee Swanson's dose of bupropion was increased at her last office visit, and she feels it is helpful. She denies worsening insomnia.  Assessment/Plan:   1. Vitamin D deficiency Low Vitamin D level contributes to fatigue and are associated with obesity, breast, and colon cancer. We will refill prescription Vitamin D for 1 month. Ashlee Swanson will follow-up for routine testing of Vitamin D, at least 2-3 times per year to avoid over-replacement.  - Vitamin D, Ergocalciferol, (DRISDOL) 1.25 MG (50000 UNIT) CAPS capsule; Take 1 capsule (50,000 Units total) by mouth every 7 (seven) days.  Dispense: 4 capsule; Refill: 0  2. Other depression with emotional eating  . We will refill bupropion for 1 month. Orders and follow up as documented in patient record.   - buPROPion (WELLBUTRIN SR) 200 MG 12 hr tablet; Take 1 tablet (200 mg total) by mouth daily.  Dispense: 30 tablet; Refill: 0  3. Class 1 obesity with serious comorbidity and body mass index (BMI) of 30.0 to 30.9 in adult,  unspecified obesity type Ashlee Swanson is currently in the action stage of change. As such, her goal is to continue with weight loss efforts. She has agreed to the Category 2 Plan.   Exercise goals: As is, and increase cardio.  Behavioral modification strategies: decreasing simple carbohydrates.  Ashlee Swanson has agreed to follow-up with our clinic in 3 weeks. She was informed of the importance of frequent follow-up visits to maximize her success with intensive lifestyle modifications for her multiple health conditions.   Objective:   Blood pressure (!) 141/54, pulse 78, temperature 98.7 F (37.1 C), height 5\' 4"  (1.626 m), weight 179 lb (81.2 kg), SpO2 98 %. Body mass index is 30.73 kg/m.  General: Cooperative, alert, well developed, in no acute distress. HEENT: Conjunctivae and lids unremarkable. Cardiovascular: Regular rhythm.  Lungs: Normal work of breathing. Neurologic: No focal deficits.   Lab Results  Component Value Date   CREATININE 1.04 (H) 01/18/2020   BUN 16 01/18/2020   NA 141 01/18/2020   K 4.7 01/18/2020   CL 104 01/18/2020   CO2 24 01/18/2020   Lab Results  Component Value Date   ALT 9 01/18/2020   AST 16 01/18/2020   ALKPHOS 71 01/18/2020   BILITOT 0.4 01/18/2020   Lab Results  Component Value Date   HGBA1C 5.0 01/18/2020   HGBA1C 5.2 06/12/2019   HGBA1C 5.2 09/07/2018   Lab Results  Component Value Date   INSULIN 7.0 01/18/2020   INSULIN 6.8  09/07/2018   Lab Results  Component Value Date   TSH 2.04 06/09/2018   Lab Results  Component Value Date   CHOL 197 01/18/2020   HDL 49 01/18/2020   LDLCALC 132 (H) 01/18/2020   TRIG 89 01/18/2020   CHOLHDL 4 06/12/2019   Lab Results  Component Value Date   WBC 7.9 06/09/2018   HGB 14.2 06/09/2018   HCT 40.7 06/09/2018   MCV 99.1 06/09/2018   PLT 353.0 06/09/2018   No results found for: IRON, TIBC, FERRITIN  Attestation Statements:   Reviewed by clinician on day of visit: allergies, medications,  problem list, medical history, surgical history, family history, social history, and previous encounter notes.   Wilhemena Durie, am acting as Location manager for [employer], FNP-C.  I have reviewed the above documentation for accuracy and completeness, and I agree with the above. -  Georgianne Fick, FNP

## 2020-06-10 ENCOUNTER — Ambulatory Visit (INDEPENDENT_AMBULATORY_CARE_PROVIDER_SITE_OTHER): Payer: 59 | Admitting: Family Medicine

## 2020-06-11 ENCOUNTER — Ambulatory Visit (INDEPENDENT_AMBULATORY_CARE_PROVIDER_SITE_OTHER): Payer: 59 | Admitting: Family Medicine

## 2020-06-11 ENCOUNTER — Encounter: Payer: Self-pay | Admitting: Family Medicine

## 2020-06-11 ENCOUNTER — Other Ambulatory Visit: Payer: Self-pay

## 2020-06-11 VITALS — BP 110/88 | HR 85 | Temp 97.1°F | Resp 18 | Ht 64.0 in | Wt 182.6 lb

## 2020-06-11 DIAGNOSIS — Z0000 Encounter for general adult medical examination without abnormal findings: Secondary | ICD-10-CM

## 2020-06-11 DIAGNOSIS — Z1159 Encounter for screening for other viral diseases: Secondary | ICD-10-CM | POA: Diagnosis not present

## 2020-06-11 LAB — VITAMIN D 25 HYDROXY (VIT D DEFICIENCY, FRACTURES): VITD: 69.5 ng/mL (ref 30.00–100.00)

## 2020-06-11 LAB — CBC WITH DIFFERENTIAL/PLATELET
Basophils Absolute: 0 10*3/uL (ref 0.0–0.1)
Basophils Relative: 0.6 % (ref 0.0–3.0)
Eosinophils Absolute: 0.1 10*3/uL (ref 0.0–0.7)
Eosinophils Relative: 0.9 % (ref 0.0–5.0)
HCT: 41.8 % (ref 36.0–46.0)
Hemoglobin: 14.2 g/dL (ref 12.0–15.0)
Lymphocytes Relative: 37.8 % (ref 12.0–46.0)
Lymphs Abs: 2.8 10*3/uL (ref 0.7–4.0)
MCHC: 34.1 g/dL (ref 30.0–36.0)
MCV: 99.9 fl (ref 78.0–100.0)
Monocytes Absolute: 0.6 10*3/uL (ref 0.1–1.0)
Monocytes Relative: 7.5 % (ref 3.0–12.0)
Neutro Abs: 3.9 10*3/uL (ref 1.4–7.7)
Neutrophils Relative %: 53.2 % (ref 43.0–77.0)
Platelets: 352 10*3/uL (ref 150.0–400.0)
RBC: 4.18 Mil/uL (ref 3.87–5.11)
RDW: 12 % (ref 11.5–15.5)
WBC: 7.4 10*3/uL (ref 4.0–10.5)

## 2020-06-11 LAB — LIPID PANEL
Cholesterol: 178 mg/dL (ref 0–200)
HDL: 42.4 mg/dL (ref >39.00)
LDL Cholesterol: 121 mg/dL — ABNORMAL HIGH (ref 0–99)
NonHDL: 135.36
Total CHOL/HDL Ratio: 4
Triglycerides: 70 mg/dL (ref 0.0–149.0)
VLDL: 14 mg/dL (ref 0.0–40.0)

## 2020-06-11 LAB — COMPREHENSIVE METABOLIC PANEL
ALT: 11 U/L (ref 0–35)
AST: 14 U/L (ref 0–37)
Albumin: 4.5 g/dL (ref 3.5–5.2)
Alkaline Phosphatase: 58 U/L (ref 39–117)
BUN: 20 mg/dL (ref 6–23)
CO2: 29 mEq/L (ref 19–32)
Calcium: 9.6 mg/dL (ref 8.4–10.5)
Chloride: 106 mEq/L (ref 96–112)
Creatinine, Ser: 1.05 mg/dL (ref 0.40–1.20)
GFR: 55.56 mL/min — ABNORMAL LOW (ref >60.00)
Glucose, Bld: 94 mg/dL (ref 70–99)
Potassium: 4.4 mEq/L (ref 3.5–5.1)
Sodium: 140 mEq/L (ref 135–145)
Total Bilirubin: 0.5 mg/dL (ref 0.2–1.2)
Total Protein: 7.1 g/dL (ref 6.0–8.3)

## 2020-06-11 LAB — TSH: TSH: 1.49 u[IU]/mL (ref 0.35–4.50)

## 2020-06-11 NOTE — Progress Notes (Signed)
+ctive:     Ashlee Swanson is a 49 y.o. female and is here for a comprehensive physical exam. The patient reports no problems.  Social History   Socioeconomic History  . Marital status: Married    Spouse name: Not on file  . Number of children: Not on file  . Years of education: Not on file  . Highest education level: Not on file  Occupational History    Comment: [employer]  Tobacco Use  . Smoking status: Former Smoker    Packs/day: 0.25    Years: 10.00    Pack years: 2.50    Quit date: 05/29/2004    Years since quitting: 16.0  . Smokeless tobacco: Never Used  Substance and Sexual Activity  . Alcohol use: Yes    Alcohol/week: 0.0 standard drinks    Comment: socially  . Drug use: No  . Sexual activity: Not on file  Other Topics Concern  . Not on file  Social History Narrative   Exercise- 4 days a week   Social Determinants of Health   Financial Resource Strain:   . Difficulty of Paying Living Expenses:   Food Insecurity:   . Worried About Charity fundraiser in the Last Year:   . Arboriculturist in the Last Year:   Transportation Needs:   . Film/video editor (Medical):   Marland Kitchen Lack of Transportation (Non-Medical):   Physical Activity:   . Days of Exercise per Week:   . Minutes of Exercise per Session:   Stress:   . Feeling of Stress :   Social Connections:   . Frequency of Communication with Friends and Family:   . Frequency of Social Gatherings with Friends and Family:   . Attends Religious Services:   . Active Member of Clubs or Organizations:   . Attends Archivist Meetings:   Marland Kitchen Marital Status:   Intimate Partner Violence:   . Fear of Current or Ex-Partner:   . Emotionally Abused:   Marland Kitchen Physically Abused:   . Sexually Abused:    Health Maintenance  Topic Date Due  . Hepatitis C Screening  Never done  . INFLUENZA VACCINE  07/21/2020  . PAP SMEAR-Modifier  11/08/2020  . MAMMOGRAM  03/26/2021  . TETANUS/TDAP  12/11/2029  .  COVID-19 Vaccine  Completed  . HIV Screening  Completed    The following portions of the patient's history were reviewed and updated as appropriate:  She  has a past medical history of Arthritis, Back pain, Bunion, DDD (degenerative disc disease), lumbar, High cholesterol, IBS (irritable bowel syndrome), Insomnia, Kidney stone, Lactose intolerance, and Snoring. She does not have any pertinent problems on file. She  has a past surgical history that includes Bunionectomy (Left, 1999). Her family history includes Breast cancer in her maternal aunt; Colon cancer in her paternal aunt; Heart disease in an other family member; Hypertension in her brother, father, and mother; Stroke in her maternal grandmother and paternal grandfather. She  reports that she quit smoking about 16 years ago. She has a 2.50 pack-year smoking history. She has never used smokeless tobacco. She reports current alcohol use. She reports that she does not use drugs. She has a current medication list which includes the following prescription(s): bupropion, cholecalciferol, eszopiclone, ibuprofen-diphenhydramine cit, polyethylene glycol, and vitamin d (ergocalciferol). Current Outpatient Medications on File Prior to Visit  Medication Sig Dispense Refill  . buPROPion (WELLBUTRIN SR) 200 MG 12 hr tablet Take 1 tablet (200 mg total) by  mouth daily. 30 tablet 0  . cholecalciferol (VITAMIN D3) 25 MCG (1000 UT) tablet Take 1,000 Units by mouth 2 (two) times daily.    . Eszopiclone 3 MG TABS Take 1 tablet (3 mg total) by mouth at bedtime as needed. Take immediately before bedtime 30 tablet 2  . Ibuprofen-diphenhydrAMINE Cit (ADVIL PM PO) Take by mouth.    . polyethylene glycol (MIRALAX / GLYCOLAX) packet Take 17 g by mouth daily.    . Vitamin D, Ergocalciferol, (DRISDOL) 1.25 MG (50000 UNIT) CAPS capsule Take 1 capsule (50,000 Units total) by mouth every 7 (seven) days. 4 capsule 0   No current facility-administered medications on file  prior to visit.   She is allergic to minocycline, codeine, and tetracycline..  Review of Systems Review of Systems  Constitutional: Negative for activity change, appetite change and fatigue.  HENT: Negative for hearing loss, congestion, tinnitus and ear discharge.  dentist q64m Eyes: Negative for visual disturbance (see optho q1y -- vision corrected to 20/20 with glasses).  Respiratory: Negative for cough, chest tightness and shortness of breath.   Cardiovascular: Negative for chest pain, palpitations and leg swelling.  Gastrointestinal: Negative for abdominal pain, diarrhea, constipation and abdominal distention.  Genitourinary: Negative for urgency, frequency, decreased urine volume and difficulty urinating.  Musculoskeletal: Negative for back pain, arthralgias and gait problem.  Skin: Negative for color change, pallor and rash.  Neurological: Negative for dizziness, light-headedness, numbness and headaches.  Hematological: Negative for adenopathy. Does not bruise/bleed easily.  Psychiatric/Behavioral: Negative for suicidal ideas, confusion, sleep disturbance, self-injury, dysphoric mood, decreased concentration and agitation.       Objective:    BP 110/88 (BP Location: Right Arm, Patient Position: Sitting, Cuff Size: Normal)   Pulse 85   Temp (!) 97.1 F (36.2 C) (Temporal)   Resp 18   Ht 5\' 4"  (1.626 m)   Wt 182 lb 9.6 oz (82.8 kg)   SpO2 98%   BMI 31.34 kg/m  General appearance: alert, cooperative, appears stated age and no distress Head: Normocephalic, without obvious abnormality, atraumatic Eyes: conjunctivae/corneas clear. PERRL, EOM's intact. Fundi benign. Throat: lips, mucosa, and tongue normal; teeth and gums normal Neck: no adenopathy, no carotid bruit, no JVD, supple, symmetrical, trachea midline and thyroid not enlarged, symmetric, no tenderness/mass/nodules Back: symmetric, no curvature. ROM normal. No CVA tenderness. Lungs: clear to auscultation  bilaterally Breasts: gyn Heart: regular rate and rhythm, S1, S2 normal, no murmur, click, rub or gallop Abdomen: soft, non-tender; bowel sounds normal; no masses,  no organomegaly Pelvic: deferred--gyn Extremities: extremities normal, atraumatic, no cyanosis or edema Pulses: 2+ and symmetric Skin: Skin color, texture, turgor normal. No rashes or lesions Lymph nodes: Cervical, supraclavicular, and axillary nodes normal. Neurologic: Alert and oriented X 3, normal strength and tone. Normal symmetric reflexes. Normal coordination and gait    Assessment:    Healthy female exam.      Plan:    ghm utd Check labs  See After Visit Summary for Counseling Recommendations    1. Morbid obesity ([place]) Healthy weight and wellness  - Lipid panel - CBC with Differential/Platelet - TSH - Comprehensive metabolic panel - Vitamin D (25 hydroxy) - Insulin, random  2. Preventative health care See above  - Lipid panel - CBC with Differential/Platelet - TSH - Comprehensive metabolic panel - Vitamin D (25 hydroxy) - Insulin, random  3. Need for hepatitis C screening test   - Hepatitis C antibody

## 2020-06-11 NOTE — Patient Instructions (Signed)
Preventive Care 50-50 Years Old, Female Preventive care refers to visits with your health care provider and lifestyle choices that can promote health and wellness. This includes:  A yearly physical exam. This may also be called an annual well check.  Regular dental visits and eye exams.  Immunizations.  Screening for certain conditions.  Healthy lifestyle choices, such as eating a healthy diet, getting regular exercise, not using drugs or products that contain nicotine and tobacco, and limiting alcohol use. What can I expect for my preventive care visit? Physical exam Your health care provider will check your:  Height and weight. This may be used to calculate body mass index (BMI), which tells if you are at a healthy weight.  Heart rate and blood pressure.  Skin for abnormal spots. Counseling Your health care provider may ask you questions about your:  Alcohol, tobacco, and drug use.  Emotional well-being.  Home and relationship well-being.  Sexual activity.  Eating habits.  Work and work Statistician.  Method of birth control.  Menstrual cycle.  Pregnancy history. What immunizations do I need?  Influenza (flu) vaccine  This is recommended every year. Tetanus, diphtheria, and pertussis (Tdap) vaccine  You may need a Td booster every 10 years. Varicella (chickenpox) vaccine  You may need this if you have not been vaccinated. Zoster (shingles) vaccine  You may need this after age 9. Measles, mumps, and rubella (MMR) vaccine  You may need at least one dose of MMR if you were born in 1957 or later. You may also need a second dose. Pneumococcal conjugate (PCV13) vaccine  You may need this if you have certain conditions and were not previously vaccinated. Pneumococcal polysaccharide (PPSV23) vaccine  You may need one or two doses if you smoke cigarettes or if you have certain conditions. Meningococcal conjugate (MenACWY) vaccine  You may need this if you  have certain conditions. Hepatitis A vaccine  You may need this if you have certain conditions or if you travel or work in places where you may be exposed to hepatitis A. Hepatitis B vaccine  You may need this if you have certain conditions or if you travel or work in places where you may be exposed to hepatitis B. Haemophilus influenzae type b (Hib) vaccine  You may need this if you have certain conditions. Human papillomavirus (HPV) vaccine  If recommended by your health care provider, you may need three doses over 6 months. You may receive vaccines as individual doses or as more than one vaccine together in one shot (combination vaccines). Talk with your health care provider about the risks and benefits of combination vaccines. What tests do I need? Blood tests  Lipid and cholesterol levels. These may be checked every 5 years, or more frequently if you are over 50 years old.  Hepatitis C test.  Hepatitis B test. Screening  Lung cancer screening. You may have this screening every year starting at age 50 if you have a 30-pack-year history of smoking and currently smoke or have quit within the past 15 years.  Colorectal cancer screening. All adults should have this screening starting at age 50 and continuing until age 33. Your health care provider may recommend screening at age 50 if you are at increased risk. You will have tests every 1-10 years, depending on your results and the type of screening test.  Diabetes screening. This is done by checking your blood sugar (glucose) after you have not eaten for a while (fasting). You may have this  done every 1-3 years.  Mammogram. This may be done every 1-2 years. Talk with your health care provider about when you should start having regular mammograms. This may depend on whether you have a family history of breast cancer.  BRCA-related cancer screening. This may be done if you have a family history of breast, ovarian, tubal, or peritoneal  cancers.  Pelvic exam and Pap test. This may be done every 3 years starting at age 50. Starting at age 50, this may be done every 5 years if you have a Pap test in combination with an HPV test. Other tests  Sexually transmitted disease (STD) testing.  Bone density scan. This is done to screen for osteoporosis. You may have this scan if you are at high risk for osteoporosis. Follow these instructions at home: Eating and drinking  Eat a diet that includes fresh fruits and vegetables, whole grains, lean protein, and low-fat dairy.  Take vitamin and mineral supplements as recommended by your health care provider.  Do not drink alcohol if: ? Your health care provider tells you not to drink. ? You are pregnant, may be pregnant, or are planning to become pregnant.  If you drink alcohol: ? Limit how much you have to 0-1 drink a day. ? Be aware of how much alcohol is in your drink. In the [place] one drink equals one 12 oz bottle of beer (355 mL), one 5 oz glass of wine (148 mL), or one 1 oz glass of hard liquor (44 mL). Lifestyle  Take daily care of your teeth and gums.  Stay active. Exercise for at least 30 minutes on 5 or more days each week.  Do not use any products that contain nicotine or tobacco, such as cigarettes, e-cigarettes, and chewing tobacco. If you need help quitting, ask your health care provider.  If you are sexually active, practice safe sex. Use a condom or other form of birth control (contraception) in order to prevent pregnancy and STIs (sexually transmitted infections).  If told by your health care provider, take low-dose aspirin daily starting at age 50. What's next?  Visit your health care provider once a year for a well check visit.  Ask your health care provider how often you should have your eyes and teeth checked.  Stay up to date on all vaccines. This information is not intended to replace advice given to you by your health care provider. Make sure you  discuss any questions you have with your health care provider. Document Revised: 08/18/2018 Document Reviewed: 08/18/2018 Elsevier Patient Education  2020 [employer].

## 2020-06-12 LAB — HEPATITIS C ANTIBODY
Hepatitis C Ab: NONREACTIVE
SIGNAL TO CUT-OFF: 0.01 (ref <1.00)

## 2020-06-12 LAB — INSULIN, RANDOM: Insulin: 6 u[IU]/mL

## 2020-06-18 ENCOUNTER — Other Ambulatory Visit: Payer: Self-pay

## 2020-06-18 ENCOUNTER — Encounter (INDEPENDENT_AMBULATORY_CARE_PROVIDER_SITE_OTHER): Payer: Self-pay | Admitting: Family Medicine

## 2020-06-18 ENCOUNTER — Ambulatory Visit (INDEPENDENT_AMBULATORY_CARE_PROVIDER_SITE_OTHER): Payer: 59 | Admitting: Family Medicine

## 2020-06-18 VITALS — BP 143/78 | HR 74 | Temp 98.0°F | Ht 64.0 in | Wt 179.0 lb

## 2020-06-18 DIAGNOSIS — Z6830 Body mass index (BMI) 30.0-30.9, adult: Secondary | ICD-10-CM | POA: Diagnosis not present

## 2020-06-18 DIAGNOSIS — F3289 Other specified depressive episodes: Secondary | ICD-10-CM | POA: Diagnosis not present

## 2020-06-18 DIAGNOSIS — E559 Vitamin D deficiency, unspecified: Secondary | ICD-10-CM

## 2020-06-18 DIAGNOSIS — E669 Obesity, unspecified: Secondary | ICD-10-CM

## 2020-06-18 MED ORDER — BUPROPION HCL ER (SR) 200 MG PO TB12: 200.0000 mg | ORAL_TABLET | Freq: Every day | ORAL | 0 refills | Status: DC

## 2020-06-19 ENCOUNTER — Encounter (INDEPENDENT_AMBULATORY_CARE_PROVIDER_SITE_OTHER): Payer: Self-pay | Admitting: Family Medicine

## 2020-06-19 NOTE — Progress Notes (Signed)
Chief Complaint:   OBESITY Analyssa is here to discuss her progress with her obesity treatment plan along with follow-up of her obesity related diagnoses. Ashlee Swanson is on the Category 2 Plan and states she is following her eating plan approximately 70% of the time. Ashlee Swanson states she is doing yoga and cardio for 60-120 minutes 4 times per week.  Today's visit was #: 89 Starting weight: 194 lbs Starting date: 09/07/2018 Today's weight: 179 lbs Today's date: 06/18/2020 Total lbs lost to date: 15 Total lbs lost since last in-office visit: 0  Interim History: Ashlee Swanson is eating more calories when she exercises as allotted by [employer]. She notes making poor food choices for snacking at night. 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  Subjective:   1. Vitamin D deficiency Ashlee Swanson Vit D level is at goal. She is on prescription Vit D.  2. Other depression with emotional eating  Ashlee Swanson notes making poor snack choices. She has snacks that are meant for her son's lunches.  Assessment/Plan:   1. Vitamin D deficiency Low Vitamin D level contributes to fatigue and are associated with obesity, breast, and colon cancer. Ashlee Swanson agreed to discontinue prescription Vitamin D, and start OTC Vit D3 5,000 IU daily. She will follow-up for routine testing of Vitamin D, at least 2-3 times per year to avoid over-replacement.  2. Other depression with emotional eating  Behavior modification techniques were discussed today to help Ashlee Swanson deal with her emotional/non-hunger eating behaviors. We will refill bupropion for 1 month. Orders and follow up as documented in patient record.   - buPROPion (WELLBUTRIN SR) 200 MG 12 hr tablet; Take 1 tablet (200 mg total) by mouth daily.  Dispense: 30 tablet; Refill: 0  3. Class 1 obesity with serious comorbidity and body mass index (BMI) of 30.0 to 30.9 in adult, unspecified obesity  type Ashlee Swanson is currently in the action stage of change. As such, her goal is to continue with weight loss efforts. She has agreed to the Category 2 Plan or keeping a food journal and adhering to recommended goals of 1300-1400 calories and 85 grams of protein daily.   Exercise goals: As is.  Behavioral modification strategies: decreasing simple carbohydrates and dealing with family or coworker sabotage.  Ashlee Swanson has agreed to follow-up with our clinic in 4 weeks. She was informed of the importance of frequent follow-up visits to maximize her success with intensive lifestyle modifications for her multiple health conditions.   Objective:   Blood pressure (!) 143/78, pulse 74, temperature 98 F (36.7 C), temperature source Oral, height 5\' 4"  (1.626 m), weight 179 lb (81.2 kg), SpO2 98 %. Body mass index is 30.73 kg/m.  General: Cooperative, alert, well developed, in no acute distress. HEENT: Conjunctivae and lids unremarkable. Cardiovascular: Regular rhythm.  Lungs: Normal work of breathing. Neurologic: No focal deficits.   Lab Results  Component Value Date   CREATININE 1.05 06/11/2020   BUN 20 06/11/2020   NA 140 06/11/2020   K 4.4 06/11/2020   CL 106 06/11/2020   CO2 29 06/11/2020   Lab Results  Component Value Date   ALT 11 06/11/2020   AST 14 06/11/2020   ALKPHOS 58 06/11/2020   BILITOT 0.5 06/11/2020   Lab Results  Component Value Date   HGBA1C 5.0 01/18/2020   HGBA1C 5.2 06/12/2019   HGBA1C 5.2 09/07/2018   Lab Results  Component  Value Date   INSULIN 7.0 01/18/2020   INSULIN 6.8 09/07/2018   Lab Results  Component Value Date   TSH 1.49 06/11/2020   Lab Results  Component Value Date   CHOL 178 06/11/2020   HDL 42.40 06/11/2020   LDLCALC 121 (H) 06/11/2020   TRIG 70.0 06/11/2020   CHOLHDL 4 06/11/2020   Lab Results  Component Value Date   WBC 7.4 06/11/2020   HGB 14.2 06/11/2020   HCT 41.8 06/11/2020   MCV 99.9 06/11/2020   PLT 352.0 06/11/2020   No  results found for: IRON, TIBC, FERRITIN  Attestation Statements:   Reviewed by clinician on day of visit: allergies, medications, problem list, medical history, surgical history, family history, social history, and previous encounter notes.   Wilhemena Durie, am acting as Location manager for [employer], FNP-C.  I have reviewed the above documentation for accuracy and completeness, and I agree with the above. -  Georgianne Fick, FNP

## 2020-07-16 ENCOUNTER — Other Ambulatory Visit: Payer: Self-pay

## 2020-07-16 ENCOUNTER — Encounter (INDEPENDENT_AMBULATORY_CARE_PROVIDER_SITE_OTHER): Payer: Self-pay | Admitting: Family Medicine

## 2020-07-16 ENCOUNTER — Ambulatory Visit (INDEPENDENT_AMBULATORY_CARE_PROVIDER_SITE_OTHER): Payer: 59 | Admitting: Family Medicine

## 2020-07-16 VITALS — BP 153/76 | HR 68 | Temp 98.2°F | Ht 64.0 in | Wt 181.0 lb

## 2020-07-16 DIAGNOSIS — E8881 Metabolic syndrome: Secondary | ICD-10-CM | POA: Diagnosis not present

## 2020-07-16 DIAGNOSIS — F3289 Other specified depressive episodes: Secondary | ICD-10-CM | POA: Diagnosis not present

## 2020-07-16 DIAGNOSIS — E669 Obesity, unspecified: Secondary | ICD-10-CM

## 2020-07-16 DIAGNOSIS — Z6831 Body mass index (BMI) 31.0-31.9, adult: Secondary | ICD-10-CM

## 2020-07-16 MED ORDER — BUPROPION HCL ER (SR) 200 MG PO TB12: 200.0000 mg | ORAL_TABLET | Freq: Every day | ORAL | 0 refills | Status: DC

## 2020-07-16 NOTE — Progress Notes (Signed)
Chief Complaint:   OBESITY Ashlee Swanson is here to discuss her progress with her obesity treatment plan along with follow-up of her obesity related diagnoses. Ashlee Swanson is on the Category 2 Plan or keeping a food journal and adhering to recommended goals of 1300-1400 calories and 85 grams of protein daily and states she is following her eating plan approximately 60% of the time. Ashlee Swanson states she is doing cardio and yoga for 30-60 minutes 2-4 times per week.  Today's visit was #: 3 Starting weight: 194 lbs Starting date: 09/07/2018 Today's weight: 181 lbs Today's date: 07/16/2020 Total lbs lost to date: 13 Total lbs lost since last in-office visit: 0  Interim History: Ashlee Swanson has been on vacation and was off the plan. She snacked on carbohydrates and she had more alcohol than usual. She is now back on the plan. She recently got a rower and has started using it.   Subjective:   1. Insulin resistance Anacaren has a diagnosis of insulin resistance based on her elevated fasting insulin level >5. She denies polyphagia, and she is not on metformin. She continues to work on diet and exercise to decrease her risk of diabetes.  Lab Results  Component Value Date   INSULIN 7.0 01/18/2020   INSULIN 6.8 09/07/2018   Lab Results  Component Value Date   HGBA1C 5.0 01/18/2020   2. Other depression with emotional eating  Ashlee Swanson's mood is stable on bupropion. Cravings are controlled.  Assessment/Plan:   1. Insulin resistance Sevin will continue her meal plan, and will continue to work on weight loss, exercise, and decreasing simple carbohydrates to help decrease the risk of diabetes. Charmelle agreed to follow-up with [place] as directed to closely monitor her progress.  2. Other depression with emotional eating  Behavior modification techniques were discussed today to help Ashlee Swanson deal with her emotional/non-hunger eating behaviors. We will refill bupropion for 1 month. Orders and follow up as documented in  patient record.   - buPROPion (WELLBUTRIN SR) 200 MG 12 hr tablet; Take 1 tablet (200 mg total) by mouth daily.  Dispense: 30 tablet; Refill: 0  3. Class 1 obesity with serious comorbidity and body mass index (BMI) of 31.0 to 31.9 in adult, unspecified obesity type Ashlee Swanson is currently in the action stage of change. As such, her goal is to continue with weight loss efforts. She has agreed to the Category 2 Plan or keeping a food journal and adhering to recommended goals of 1300-1400 calories and 85 grams of protein daily.   Exercise goals: As is.  Behavioral modification strategies: increasing lean protein intake and decreasing simple carbohydrates.  Ashlee Swanson has agreed to follow-up with our clinic in 3 weeks. She was informed of the importance of frequent follow-up visits to maximize her success with intensive lifestyle modifications for her multiple health conditions.   Objective:   Blood pressure (!) 153/76, pulse 68, temperature 98.2 F (36.8 C), temperature source Oral, height 5\' 4"  (1.626 m), weight 181 lb (82.1 kg), SpO2 99 %. Body mass index is 31.07 kg/m.  General: Cooperative, alert, well developed, in no acute distress. HEENT: Conjunctivae and lids unremarkable. Cardiovascular: Regular rhythm.  Lungs: Normal work of breathing. Neurologic: No focal deficits.   Lab Results  Component Value Date   CREATININE 1.05 06/11/2020   BUN 20 06/11/2020   NA 140 06/11/2020   K 4.4 06/11/2020   CL 106 06/11/2020   CO2 29 06/11/2020   Lab Results  Component Value Date   ALT  11 06/11/2020   AST 14 06/11/2020   ALKPHOS 58 06/11/2020   BILITOT 0.5 06/11/2020   Lab Results  Component Value Date   HGBA1C 5.0 01/18/2020   HGBA1C 5.2 06/12/2019   HGBA1C 5.2 09/07/2018   Lab Results  Component Value Date   INSULIN 7.0 01/18/2020   INSULIN 6.8 09/07/2018   Lab Results  Component Value Date   TSH 1.49 06/11/2020   Lab Results  Component Value Date   CHOL 178 06/11/2020    HDL 42.40 06/11/2020   LDLCALC 121 (H) 06/11/2020   TRIG 70.0 06/11/2020   CHOLHDL 4 06/11/2020   Lab Results  Component Value Date   WBC 7.4 06/11/2020   HGB 14.2 06/11/2020   HCT 41.8 06/11/2020   MCV 99.9 06/11/2020   PLT 352.0 06/11/2020   No results found for: IRON, TIBC, FERRITIN  Attestation Statements:   Reviewed by clinician on day of visit: allergies, medications, problem list, medical history, surgical history, family history, social history, and previous encounter notes.   Wilhemena Durie, am acting as Location manager for [employer], FNP-C.  I have reviewed the above documentation for accuracy and completeness, and I agree with the above. -  Georgianne Fick, FNP

## 2020-08-06 ENCOUNTER — Ambulatory Visit (INDEPENDENT_AMBULATORY_CARE_PROVIDER_SITE_OTHER): Payer: 59 | Admitting: Family Medicine

## 2020-08-14 ENCOUNTER — Encounter (INDEPENDENT_AMBULATORY_CARE_PROVIDER_SITE_OTHER): Payer: Self-pay | Admitting: Family Medicine

## 2020-08-14 ENCOUNTER — Other Ambulatory Visit: Payer: Self-pay

## 2020-08-14 ENCOUNTER — Ambulatory Visit (INDEPENDENT_AMBULATORY_CARE_PROVIDER_SITE_OTHER): Payer: 59 | Admitting: Family Medicine

## 2020-08-14 VITALS — BP 156/83 | HR 71 | Temp 98.2°F | Ht 64.0 in | Wt 178.0 lb

## 2020-08-14 DIAGNOSIS — Z9189 Other specified personal risk factors, not elsewhere classified: Secondary | ICD-10-CM

## 2020-08-14 DIAGNOSIS — F3289 Other specified depressive episodes: Secondary | ICD-10-CM | POA: Diagnosis not present

## 2020-08-14 DIAGNOSIS — E669 Obesity, unspecified: Secondary | ICD-10-CM | POA: Diagnosis not present

## 2020-08-14 DIAGNOSIS — Z6830 Body mass index (BMI) 30.0-30.9, adult: Secondary | ICD-10-CM

## 2020-08-14 DIAGNOSIS — I10 Essential (primary) hypertension: Secondary | ICD-10-CM

## 2020-08-14 MED ORDER — CHLORTHALIDONE 25 MG PO TABS: 12.5000 mg | ORAL_TABLET | Freq: Every day | ORAL | 0 refills | Status: DC

## 2020-08-14 NOTE — Progress Notes (Signed)
The 10-year ASCVD risk score [name] DC [name], et al., 2013) is: 2.7%   Values used to calculate the score:     Age: 49 years     Sex: Female     Is Non-Hispanic African American: No     Diabetic: No     Tobacco smoker: No     Systolic Blood Pressure: 676 mmHg     Is BP treated: Yes     HDL Cholesterol: 42.4 mg/dL     Total Cholesterol: 178 mg/dL

## 2020-08-14 NOTE — Progress Notes (Signed)
Chief Complaint:   OBESITY Ashlee Swanson is here to discuss her progress with her obesity treatment plan along with follow-up of her obesity related diagnoses. Ashlee Swanson is on the Category 2 Plan or keeping a food journal and adhering to recommended goals of 1300-1400 calories and 85 grams of protein daily and states she is following her eating plan approximately 70% of the time. Ashlee Swanson states she is yoga and cardio for 30-60 minutes 1-2 times per week.  Today's visit was #: 57 Starting weight: 194 lbs Starting date: 09/07/2018 Today's weight: 178 lbs Today's date: 08/14/2020 Total lbs lost to date: 16 Total lbs lost since last in-office visit: 3  Interim History: Ashlee Swanson has been using her new rowing machine. She took another beach trip but she managed to stay on the plan mostly. She turned off the step tracker on her phone so she would not use the extra calories it allows for.  Subjective:   1. Essential hypertension Nakiya's blood pressure has been elevated the last 2 visits. It has been elevated sporadically over the past few years. She has a strong family history of stroke and hypertension. Cardiovascular ROS: no chest pain or dyspnea on exertion.  BP Readings from Last 3 Encounters:  08/14/20 (!) 156/83  07/16/20 (!) 153/76  06/18/20 (!) 143/78   Lab Results  Component Value Date   CREATININE 1.05 06/11/2020   CREATININE 1.04 (H) 01/18/2020   CREATININE 0.84 06/12/2019   2. Other depression with emotional eating  Ashlee Swanson stopped bupropion for 3 days to see if this increased her blood pressure but her blood pressure was still elevated. She started back on bupropion. She notes decreased cravings with bupropion.  3. At risk for heart disease Ashlee Swanson is at a higher than average risk for cardiovascular disease due to obesity and hypertension.   Assessment/Plan:   1. Essential hypertension Madolin is working on healthy weight loss and exercise to improve blood pressure control. We  will watch for signs of hypotension as she continues her lifestyle modifications. Lonnette agreed to start chlorthalidone 25 mg (she is to take 1/2 tablet at 12.5 mg daily) with no refills.  - chlorthalidone (HYGROTON) 25 MG tablet; Take 0.5 tablets (12.5 mg total) by mouth daily.  Dispense: 15 tablet; Refill: 0  2. Other depression with emotional eating  Behavior modification techniques were discussed today to help Ashlee Swanson deal with her emotional/non-hunger eating behaviors. Ashlee Swanson will continue bupropion as is. Orders and follow up as documented in patient record.   3. At risk for heart disease Ashlee Swanson was given approximately 15 minutes of coronary artery disease prevention counseling today. She is 49 y.o. female and has risk factors for heart disease including obesity. We discussed intensive lifestyle modifications today with an emphasis on specific weight loss instructions and strategies.   Repetitive spaced learning was employed today to elicit superior memory formation and behavioral change.  4. Class 1 obesity with serious comorbidity and body mass index (BMI) of 30.0 to 30.9 in adult, unspecified obesity type Ashlee Swanson is currently in the action stage of change. As such, her goal is to continue with weight loss efforts. She has agreed to keeping a food journal and adhering to recommended goals of 1300-1400 calories and 85 grams of protein daily.   Exercise goals: As is.  Behavioral modification strategies: increasing lean protein intake and decreasing simple carbohydrates.  Ashlee Swanson has agreed to follow-up with our clinic in 3 to 4 weeks. She was informed of the importance of frequent  follow-up visits to maximize her success with intensive lifestyle modifications for her multiple health conditions.   Objective:   Blood pressure (!) 156/83, pulse 71, temperature 98.2 F (36.8 C), temperature source Oral, height 5\' 4"  (1.626 m), weight 178 lb (80.7 kg), SpO2 98 %. Body mass index is 30.55  kg/m.  General: Cooperative, alert, well developed, in no acute distress. HEENT: Conjunctivae and lids unremarkable. Cardiovascular: Regular rhythm.  Lungs: Normal work of breathing. Neurologic: No focal deficits.   Lab Results  Component Value Date   CREATININE 1.05 06/11/2020   BUN 20 06/11/2020   NA 140 06/11/2020   K 4.4 06/11/2020   CL 106 06/11/2020   CO2 29 06/11/2020   Lab Results  Component Value Date   ALT 11 06/11/2020   AST 14 06/11/2020   ALKPHOS 58 06/11/2020   BILITOT 0.5 06/11/2020   Lab Results  Component Value Date   HGBA1C 5.0 01/18/2020   HGBA1C 5.2 06/12/2019   HGBA1C 5.2 09/07/2018   Lab Results  Component Value Date   INSULIN 7.0 01/18/2020   INSULIN 6.8 09/07/2018   Lab Results  Component Value Date   TSH 1.49 06/11/2020   Lab Results  Component Value Date   CHOL 178 06/11/2020   HDL 42.40 06/11/2020   LDLCALC 121 (H) 06/11/2020   TRIG 70.0 06/11/2020   CHOLHDL 4 06/11/2020   Lab Results  Component Value Date   WBC 7.4 06/11/2020   HGB 14.2 06/11/2020   HCT 41.8 06/11/2020   MCV 99.9 06/11/2020   PLT 352.0 06/11/2020   No results found for: IRON, TIBC, FERRITIN  Attestation Statements:   Reviewed by clinician on day of visit: allergies, medications, problem list, medical history, surgical history, family history, social history, and previous encounter notes.   Wilhemena Durie, am acting as Location manager for [employer], FNP-C.  I have reviewed the above documentation for accuracy and completeness, and I agree with the above. -  Georgianne Fick, FNP

## 2020-08-15 ENCOUNTER — Encounter (INDEPENDENT_AMBULATORY_CARE_PROVIDER_SITE_OTHER): Payer: Self-pay | Admitting: Family Medicine

## 2020-09-04 ENCOUNTER — Other Ambulatory Visit: Payer: Self-pay

## 2020-09-04 ENCOUNTER — Ambulatory Visit (INDEPENDENT_AMBULATORY_CARE_PROVIDER_SITE_OTHER): Payer: 59 | Admitting: Family Medicine

## 2020-09-04 ENCOUNTER — Encounter (INDEPENDENT_AMBULATORY_CARE_PROVIDER_SITE_OTHER): Payer: Self-pay | Admitting: Family Medicine

## 2020-09-04 VITALS — BP 124/76 | HR 74 | Temp 98.2°F | Ht 64.0 in | Wt 177.0 lb

## 2020-09-04 DIAGNOSIS — Z9189 Other specified personal risk factors, not elsewhere classified: Secondary | ICD-10-CM

## 2020-09-04 DIAGNOSIS — I10 Essential (primary) hypertension: Secondary | ICD-10-CM | POA: Diagnosis not present

## 2020-09-04 DIAGNOSIS — E669 Obesity, unspecified: Secondary | ICD-10-CM | POA: Diagnosis not present

## 2020-09-04 DIAGNOSIS — F3289 Other specified depressive episodes: Secondary | ICD-10-CM

## 2020-09-04 DIAGNOSIS — Z6830 Body mass index (BMI) 30.0-30.9, adult: Secondary | ICD-10-CM

## 2020-09-04 MED ORDER — CHLORTHALIDONE 25 MG PO TABS: 12.5000 mg | ORAL_TABLET | Freq: Every day | ORAL | 0 refills | Status: DC

## 2020-09-04 MED ORDER — BUPROPION HCL ER (SR) 200 MG PO TB12: 200.0000 mg | ORAL_TABLET | Freq: Every day | ORAL | 0 refills | Status: DC

## 2020-09-05 NOTE — Progress Notes (Signed)
Chief Complaint:   OBESITY Celisse is here to discuss her progress with her obesity treatment plan along with follow-up of her obesity related diagnoses. Breena is on keeping a food journal and adhering to recommended goals of 1300-1400 calories and 85 grams of protein daily and states she is following her eating plan approximately 70% of the time. Kymoni states she is doing yoga and cardio for 30-60 minutes 2-3 times per week.  Today's visit was #: 58 Starting weight: 194 lbs Starting date: 09/07/2018 Today's weight: 177 lbs Today's date: 09/04/2020 Total lbs lost to date: 17 Total lbs lost since last in-office visit: 1  Interim History: Yocelin did not adhere well to the plan or exercise last week. This week she reports she has done better. She is still not using [employer] now which keeps her from eating the extra calories allowed when she exercises. She is not always getting her protein in.  Subjective:   1. Essential hypertension Shirlean's blood pressure has improved today. She is on chlorthalidone 12.5 mg, added at her last office visit.  BP Readings from Last 3 Encounters:  09/04/20 124/76  08/14/20 (!) 156/83  07/16/20 (!) 153/76   Lab Results  Component Value Date   CREATININE 1.05 06/11/2020   CREATININE 1.04 (H) 01/18/2020   CREATININE 0.84 06/12/2019   2. Other depression with emotional eating  Brittni's is on bupropion 200 mg daily and takes it at 3 pm. She denies insomnia.   3. At risk for deficient intake of food The patient is at a higher than average risk of deficient intake of food due to lack of protein.  Assessment/Plan:   1. Essential hypertension  We will refill chlorthalidone for 1 month.  - chlorthalidone (HYGROTON) 25 MG tablet; Take 0.5 tablets (12.5 mg total) by mouth daily.  Dispense: 15 tablet; Refill: 0  2. Other depression with emotional eating   We will refill bupropion for 1 month.   - buPROPion (WELLBUTRIN SR) 200 MG 12 hr  tablet; Take 1 tablet (200 mg total) by mouth daily.  Dispense: 30 tablet; Refill: 0  3. At risk for deficient intake of food Anushka was given approximately 15 minutes of deficit intake of food prevention counseling today. Bich is at risk for eating too little proteinb based on current food recall. She was encouraged to focus on meeting caloric and protein goals according to her recommended meal plan.   4. Class 1 obesity with serious comorbidity and body mass index (BMI) of 30.0 to 30.9 in adult, unspecified obesity type Adelma is currently in the action stage of change. As such, her goal is to continue with weight loss efforts. She has agreed to the Category 2 Plan.   Exercise goals: As is.  Behavioral modification strategies: increasing lean protein intake, decreasing simple carbohydrates and travel eating strategies.  Dajanae has agreed to follow-up with our clinic in 3 to 4 weeks.  Objective:   Blood pressure 124/76, pulse 74, temperature 98.2 F (36.8 C), temperature source Oral, height 5\' 4"  (1.626 m), weight 177 lb (80.3 kg), SpO2 97 %. Body mass index is 30.38 kg/m.  General: Cooperative, alert, well developed, in no acute distress. HEENT: Conjunctivae and lids unremarkable. Cardiovascular: Regular rhythm.  Lungs: Normal work of breathing. Neurologic: No focal deficits.   Lab Results  Component Value Date   CREATININE 1.05 06/11/2020   BUN 20 06/11/2020   NA 140 06/11/2020   K 4.4 06/11/2020   CL 106 06/11/2020  CO2 29 06/11/2020   Lab Results  Component Value Date   ALT 11 06/11/2020   AST 14 06/11/2020   ALKPHOS 58 06/11/2020   BILITOT 0.5 06/11/2020   Lab Results  Component Value Date   HGBA1C 5.0 01/18/2020   HGBA1C 5.2 06/12/2019   HGBA1C 5.2 09/07/2018   Lab Results  Component Value Date   INSULIN 7.0 01/18/2020   INSULIN 6.8 09/07/2018   Lab Results  Component Value Date   TSH 1.49 06/11/2020   Lab Results  Component Value Date   CHOL 178  06/11/2020   HDL 42.40 06/11/2020   LDLCALC 121 (H) 06/11/2020   TRIG 70.0 06/11/2020   CHOLHDL 4 06/11/2020   Lab Results  Component Value Date   WBC 7.4 06/11/2020   HGB 14.2 06/11/2020   HCT 41.8 06/11/2020   MCV 99.9 06/11/2020   PLT 352.0 06/11/2020   No results found for: IRON, TIBC, FERRITIN  Attestation Statements:   Reviewed by clinician on day of visit: allergies, medications, problem list, medical history, surgical history, family history, social history, and previous encounter notes.   Wilhemena Durie, am acting as Location manager for [employer], FNP-C.  I have reviewed the above documentation for accuracy and completeness, and I agree with the above. -  Georgianne Fick, FNP

## 2020-09-07 ENCOUNTER — Other Ambulatory Visit (INDEPENDENT_AMBULATORY_CARE_PROVIDER_SITE_OTHER): Payer: Self-pay | Admitting: Family Medicine

## 2020-09-07 DIAGNOSIS — I10 Essential (primary) hypertension: Secondary | ICD-10-CM

## 2020-09-09 ENCOUNTER — Encounter (INDEPENDENT_AMBULATORY_CARE_PROVIDER_SITE_OTHER): Payer: Self-pay | Admitting: Family Medicine

## 2020-09-25 ENCOUNTER — Other Ambulatory Visit: Payer: Self-pay

## 2020-09-25 ENCOUNTER — Encounter (INDEPENDENT_AMBULATORY_CARE_PROVIDER_SITE_OTHER): Payer: Self-pay | Admitting: Family Medicine

## 2020-09-25 ENCOUNTER — Ambulatory Visit (INDEPENDENT_AMBULATORY_CARE_PROVIDER_SITE_OTHER): Payer: 59 | Admitting: Family Medicine

## 2020-09-25 VITALS — BP 119/79 | HR 68 | Temp 98.3°F | Ht 64.0 in | Wt 179.0 lb

## 2020-09-25 DIAGNOSIS — I10 Essential (primary) hypertension: Secondary | ICD-10-CM | POA: Diagnosis not present

## 2020-09-25 DIAGNOSIS — E669 Obesity, unspecified: Secondary | ICD-10-CM | POA: Diagnosis not present

## 2020-09-25 DIAGNOSIS — Z6830 Body mass index (BMI) 30.0-30.9, adult: Secondary | ICD-10-CM

## 2020-09-25 NOTE — Progress Notes (Signed)
Chief Complaint:   OBESITY Ashlee Swanson is here to discuss her progress with her obesity treatment plan along with follow-up of her obesity related diagnoses. [name] is on the Category 2 Plan and states she is following her eating plan approximately 60% of the time. [name] states she is doing cardio for 30 minutes 2 times per week, and doing yoga for 60 minutes 2 times per week.  Today's visit was #: 87 Starting weight: 194 lbs Starting date: 09/07/2018 Today's weight: 179 lbs Today's date: 09/25/2020 Total lbs lost to date: 15 Total lbs lost since last in-office visit: 0  Interim History: Ashlee Swanson has been off the plan due to recent travel. She has not gotten groceries to get back on the plan. She has not been exercising. She plans on getting groceries today and getting back on the plan.  Subjective:   1. Essential hypertension Ashlee Swanson's blood pressure is well controlled with 12.5 mg of HCTZ daily. I added hydrochlorothiazide added within the last few months. She denies side effects of hydrochlorothiazide.   BP Readings from Last 3 Encounters:  09/25/20 119/79  09/04/20 124/76  08/14/20 (!) 156/83   Lab Results  Component Value Date   CREATININE 1.05 06/11/2020   CREATININE 1.04 (H) 01/18/2020   CREATININE 0.84 06/12/2019   Assessment/Plan:   1. Essential hypertension Ashlee Swanson will continue hydrochlorothiazide 12.5 mg a daily.   2. Class 1 obesity with serious comorbidity and body mass index (BMI) of 30.0 to 30.9 in adult, unspecified obesity type Ashlee Swanson is currently in the action stage of change. As such, her goal is to continue with weight loss efforts. She has agreed to the Category 2 Plan.   Exercise goals: As is.  Behavioral modification strategies: increasing lean protein intake and decreasing simple carbohydrates.  Ashlee Swanson has agreed to follow-up with our clinic in 3 weeks.   Objective:   Blood pressure 119/79, pulse 68, temperature 98.3 F (36.8 C), temperature  source Oral, height 5\' 4"  (1.626 m), weight 179 lb (81.2 kg), SpO2 96 %. Body mass index is 30.73 kg/m.  General: Cooperative, alert, well developed, in no acute distress. HEENT: Conjunctivae and lids unremarkable. Cardiovascular: Regular rhythm.  Lungs: Normal work of breathing. Neurologic: No focal deficits.   Lab Results  Component Value Date   CREATININE 1.05 06/11/2020   BUN 20 06/11/2020   NA 140 06/11/2020   K 4.4 06/11/2020   CL 106 06/11/2020   CO2 29 06/11/2020   Lab Results  Component Value Date   ALT 11 06/11/2020   AST 14 06/11/2020   ALKPHOS 58 06/11/2020   BILITOT 0.5 06/11/2020   Lab Results  Component Value Date   HGBA1C 5.0 01/18/2020   HGBA1C 5.2 06/12/2019   HGBA1C 5.2 09/07/2018   Lab Results  Component Value Date   INSULIN 7.0 01/18/2020   INSULIN 6.8 09/07/2018   Lab Results  Component Value Date   TSH 1.49 06/11/2020   Lab Results  Component Value Date   CHOL 178 06/11/2020   HDL 42.40 06/11/2020   LDLCALC 121 (H) 06/11/2020   TRIG 70.0 06/11/2020   CHOLHDL 4 06/11/2020   Lab Results  Component Value Date   WBC 7.4 06/11/2020   HGB 14.2 06/11/2020   HCT 41.8 06/11/2020   MCV 99.9 06/11/2020   PLT 352.0 06/11/2020   No results found for: IRON, TIBC, FERRITIN  Attestation Statements:   Reviewed by clinician on day of visit: allergies, medications, problem list, medical history, surgical  history, family history, social history, and previous encounter notes.   Wilhemena Durie, am acting as Location manager for [employer], FNP-C.  I have reviewed the above documentation for accuracy and completeness, and I agree with the above. -  Georgianne Fick, FNP

## 2020-09-26 ENCOUNTER — Ambulatory Visit (INDEPENDENT_AMBULATORY_CARE_PROVIDER_SITE_OTHER): Payer: 59 | Admitting: Family Medicine

## 2020-10-02 ENCOUNTER — Other Ambulatory Visit: Payer: Self-pay | Admitting: Family Medicine

## 2020-10-02 ENCOUNTER — Telehealth: Payer: Self-pay | Admitting: Family Medicine

## 2020-10-02 ENCOUNTER — Encounter: Payer: Self-pay | Admitting: Family Medicine

## 2020-10-02 NOTE — Telephone Encounter (Signed)
Requesting: eszopiclone 3mg   Contract: 06/09/2018 UDS: 06/09/2018 Last Visit: 06/11/2020 Next Visit: 06/12/2021 Last Refill: 02/15/2020 #30 and 2RF Pt sig: 1 tab qhs prn  Please Advise

## 2020-10-02 NOTE — Telephone Encounter (Signed)
PDMP okay, Rx sent 

## 2020-10-03 ENCOUNTER — Other Ambulatory Visit: Payer: Self-pay | Admitting: Family Medicine

## 2020-10-03 DIAGNOSIS — G4700 Insomnia, unspecified: Secondary | ICD-10-CM

## 2020-10-03 MED ORDER — ESZOPICLONE 3 MG PO TABS: 3.0000 mg | ORAL_TABLET | Freq: Every evening | ORAL | 1 refills | Status: DC | PRN

## 2020-10-03 NOTE — Telephone Encounter (Signed)
sent 

## 2020-10-03 NOTE — Telephone Encounter (Signed)
Requesting:Eszopiclone  Contract:06/09/2018 IXV:EZBM Last Visit:06/11/2020 Next Visit:06/12/2021 Last Refill: Yesterday  Please Advise

## 2020-10-16 ENCOUNTER — Encounter (INDEPENDENT_AMBULATORY_CARE_PROVIDER_SITE_OTHER): Payer: Self-pay | Admitting: Family Medicine

## 2020-10-16 ENCOUNTER — Ambulatory Visit (INDEPENDENT_AMBULATORY_CARE_PROVIDER_SITE_OTHER): Payer: 59 | Admitting: Family Medicine

## 2020-10-16 ENCOUNTER — Other Ambulatory Visit: Payer: Self-pay

## 2020-10-16 VITALS — BP 132/80 | HR 69 | Temp 97.8°F | Ht 64.0 in | Wt 179.0 lb

## 2020-10-16 DIAGNOSIS — I10 Essential (primary) hypertension: Secondary | ICD-10-CM

## 2020-10-16 DIAGNOSIS — F3289 Other specified depressive episodes: Secondary | ICD-10-CM | POA: Diagnosis not present

## 2020-10-16 DIAGNOSIS — E669 Obesity, unspecified: Secondary | ICD-10-CM

## 2020-10-16 DIAGNOSIS — Z6830 Body mass index (BMI) 30.0-30.9, adult: Secondary | ICD-10-CM | POA: Diagnosis not present

## 2020-10-16 MED ORDER — BUPROPION HCL ER (SR) 200 MG PO TB12: 200.0000 mg | ORAL_TABLET | Freq: Every day | ORAL | 0 refills | Status: DC

## 2020-10-16 MED ORDER — CHLORTHALIDONE 25 MG PO TABS: 12.5000 mg | ORAL_TABLET | Freq: Every day | ORAL | 0 refills | Status: DC

## 2020-10-17 ENCOUNTER — Encounter (INDEPENDENT_AMBULATORY_CARE_PROVIDER_SITE_OTHER): Payer: Self-pay | Admitting: Family Medicine

## 2020-10-17 NOTE — Progress Notes (Signed)
Chief Complaint:   OBESITY Ashlee Swanson is here to discuss her progress with her obesity treatment plan along with follow-up of her obesity related diagnoses. Ashlee Swanson is on the Category 2 Plan and states she is following her eating plan approximately 65% of the time. Ashlee Swanson states she is doing yoga for 1 hour  2 times per week.  Today's visit was #: 45 Starting weight: 194 lbs Starting date: 09/07/2018 Today's weight: 179 lbs Today's date: 10/16/2020 Total lbs lost to date: 15 Total lbs lost since last in-office visit: 0  Interim History: Ashlee Swanson has been omitting her cardio recently. She feels she has been out of her normal groove with eating. She is on the plan at breakfast. She is deviating at lunch. She has a frozen meal at supper 20 grams of protein.  Subjective:   1. Essential hypertension Ashlee Swanson blood pressure is well controlled. She is on chlorthalidone. Cardiovascular ROS: no chest pain or dyspnea on exertion.  BP Readings from Last 3 Encounters:  10/16/20 132/80  09/25/20 119/79  09/04/20 124/76   Lab Results  Component Value Date   CREATININE 1.05 06/11/2020   CREATININE 1.04 (H) 01/18/2020   CREATININE 0.84 06/12/2019   2. Other depression with emotional eating  Ashlee Swanson still feels the bupropion helps a great deal with snacking at night.  Assessment/Plan:   1. Essential hypertension . We will refill chlorthalidone for 1 month, (take 1/2 tablet).  - chlorthalidone (HYGROTON) 25 MG tablet; Take 0.5 tablets (12.5 mg total) by mouth daily.  Dispense: 15 tablet; Refill: 0  2. Other depression with emotional eating  . We will refill bupropion for 1 month.    - buPROPion (WELLBUTRIN SR) 200 MG 12 hr tablet; Take 1 tablet (200 mg total) by mouth daily.  Dispense: 30 tablet; Refill: 0  3. Class 1 obesity with serious comorbidity and body mass index (BMI) of 30.0 to 30.9 in adult, unspecified obesity type Ashlee Swanson is currently in the action stage of change. As such, her  goal is to continue with weight loss efforts. She has agreed to the Category 2 Plan and keeping a food journal and adhering to recommended goals of 400-500 calories and 35 grams of protein at supper daily.   Ashlee Swanson will journal at supper and increase protein in general.  Exercise goals: She is to add back cardio.  Behavioral modification strategies: increasing lean protein intake and decreasing simple carbohydrates.  Ashlee Swanson has agreed to follow-up with our clinic in 3 weeks.   Objective:   Blood pressure 132/80, pulse 69, temperature 97.8 F (36.6 C), height 5\' 4"  (1.626 m), weight 179 lb (81.2 kg), SpO2 96 %. Body mass index is 30.73 kg/m.  General: Cooperative, alert, well developed, in no acute distress. HEENT: Conjunctivae and lids unremarkable. Cardiovascular: Regular rhythm.  Lungs: Normal work of breathing. Neurologic: No focal deficits.   Lab Results  Component Value Date   CREATININE 1.05 06/11/2020   BUN 20 06/11/2020   NA 140 06/11/2020   K 4.4 06/11/2020   CL 106 06/11/2020   CO2 29 06/11/2020   Lab Results  Component Value Date   ALT 11 06/11/2020   AST 14 06/11/2020   ALKPHOS 58 06/11/2020   BILITOT 0.5 06/11/2020   Lab Results  Component Value Date   HGBA1C 5.0 01/18/2020   HGBA1C 5.2 06/12/2019   HGBA1C 5.2 09/07/2018   Lab Results  Component Value Date   INSULIN 7.0 01/18/2020   INSULIN 6.8 09/07/2018  Lab Results  Component Value Date   TSH 1.49 06/11/2020   Lab Results  Component Value Date   CHOL 178 06/11/2020   HDL 42.40 06/11/2020   LDLCALC 121 (H) 06/11/2020   TRIG 70.0 06/11/2020   CHOLHDL 4 06/11/2020   Lab Results  Component Value Date   WBC 7.4 06/11/2020   HGB 14.2 06/11/2020   HCT 41.8 06/11/2020   MCV 99.9 06/11/2020   PLT 352.0 06/11/2020   No results found for: IRON, TIBC, FERRITIN  Attestation Statements:   Reviewed by clinician on day of visit: allergies, medications, problem list, medical history, surgical  history, family history, social history, and previous encounter notes.   Wilhemena Durie, am acting as Location manager for [employer], FNP-C.  I have reviewed the above documentation for accuracy and completeness, and I agree with the above. - Georgianne Fick, FNP

## 2020-11-06 ENCOUNTER — Encounter (INDEPENDENT_AMBULATORY_CARE_PROVIDER_SITE_OTHER): Payer: Self-pay | Admitting: Family Medicine

## 2020-11-06 ENCOUNTER — Ambulatory Visit (INDEPENDENT_AMBULATORY_CARE_PROVIDER_SITE_OTHER): Payer: 59 | Admitting: Family Medicine

## 2020-11-06 ENCOUNTER — Other Ambulatory Visit: Payer: Self-pay

## 2020-11-06 VITALS — BP 122/77 | HR 76 | Temp 98.5°F | Ht 64.0 in | Wt 177.0 lb

## 2020-11-06 DIAGNOSIS — Z6830 Body mass index (BMI) 30.0-30.9, adult: Secondary | ICD-10-CM | POA: Diagnosis not present

## 2020-11-06 DIAGNOSIS — F3289 Other specified depressive episodes: Secondary | ICD-10-CM | POA: Diagnosis not present

## 2020-11-06 DIAGNOSIS — E8881 Metabolic syndrome: Secondary | ICD-10-CM

## 2020-11-06 DIAGNOSIS — E669 Obesity, unspecified: Secondary | ICD-10-CM

## 2020-11-06 MED ORDER — BUPROPION HCL ER (SR) 200 MG PO TB12: 200.0000 mg | ORAL_TABLET | Freq: Every day | ORAL | 0 refills | Status: DC

## 2020-11-07 NOTE — Progress Notes (Signed)
Chief Complaint:   OBESITY Ashlee Swanson is here to discuss her progress with her obesity treatment plan along with follow-up of her obesity related diagnoses. Ashlee Swanson is on the Category 2 Plan and keeping a food journal and adhering to recommended goals of 400-500 calories and 35+ grams of protein at supper daily and states she is following her eating plan approximately 70% of the time. Caylor states she is doing yoga for 60 minutes 2 times per week.  Today's visit was #: 97 Starting weight: 194 lbs Starting date: 09/07/2018 Today's weight: 177 lbs Today's date: 11/06/2020 Total lbs lost to date: 17 Total lbs lost since last in-office visit: 2  Interim History: Ashlee Swanson is sticking to the plan more exactly. She is down 2 lbs today. She struggles to fit in workout time.  Subjective:   1. Insulin resistance Ashlee Swanson has a diagnosis of insulin resistance based on her elevated fasting insulin level >5. Her hunger has improved with more protein.   Lab Results  Component Value Date   INSULIN 7.0 01/18/2020   INSULIN 6.8 09/07/2018   Lab Results  Component Value Date   HGBA1C 5.0 01/18/2020   2. Other depression with emotional eating  Ashlee Swanson's mood is stable. She feels bupropion helps with cravings.  Assessment/Plan:   1. Insulin resistance Ashlee Swanson will continue her meal plan and her exercise regimen.  2. Other depression with emotional eating   We will refill bupropion for 1 month.   - buPROPion (WELLBUTRIN SR) 200 MG 12 hr tablet; Take 1 tablet (200 mg total) by mouth daily.  Dispense: 30 tablet; Refill: 0  3. Class 1 obesity with serious comorbidity and body mass index (BMI) of 30.0 to 30.9 in adult, unspecified obesity type Ashlee Swanson is currently in the action stage of change. As such, her goal is to continue with weight loss efforts. She has agreed to the Category 2 Plan and keeping a food journal and adhering to recommended goals of 400-500 calories and 35 grams of protein at supper  daily.   Handouts were given today: Recipes (holiday), and [employer].  Exercise goals: As is.  Behavioral modification strategies: holiday eating strategies .  Machele has agreed to follow-up with our clinic in 3 weeks.   Objective:   Blood pressure 122/77, pulse 76, temperature 98.5 F (36.9 C), height 5\' 4"  (1.626 m), weight 177 lb (80.3 kg), SpO2 97 %. Body mass index is 30.38 kg/m.  General: Cooperative, alert, well developed, in no acute distress. HEENT: Conjunctivae and lids unremarkable. Cardiovascular: Regular rhythm.  Lungs: Normal work of breathing. Neurologic: No focal deficits.   Lab Results  Component Value Date   CREATININE 1.05 06/11/2020   BUN 20 06/11/2020   NA 140 06/11/2020   K 4.4 06/11/2020   CL 106 06/11/2020   CO2 29 06/11/2020   Lab Results  Component Value Date   ALT 11 06/11/2020   AST 14 06/11/2020   ALKPHOS 58 06/11/2020   BILITOT 0.5 06/11/2020   Lab Results  Component Value Date   HGBA1C 5.0 01/18/2020   HGBA1C 5.2 06/12/2019   HGBA1C 5.2 09/07/2018   Lab Results  Component Value Date   INSULIN 7.0 01/18/2020   INSULIN 6.8 09/07/2018   Lab Results  Component Value Date   TSH 1.49 06/11/2020   Lab Results  Component Value Date   CHOL 178 06/11/2020   HDL 42.40 06/11/2020   LDLCALC 121 (H) 06/11/2020   TRIG 70.0 06/11/2020   CHOLHDL  4 06/11/2020   Lab Results  Component Value Date   WBC 7.4 06/11/2020   HGB 14.2 06/11/2020   HCT 41.8 06/11/2020   MCV 99.9 06/11/2020   PLT 352.0 06/11/2020   No results found for: IRON, TIBC, FERRITIN  Attestation Statements:   Reviewed by clinician on day of visit: allergies, medications, problem list, medical history, surgical history, family history, social history, and previous encounter notes.   Wilhemena Durie, am acting as Location manager for [employer], FNP-C.  I have reviewed the above documentation for accuracy and completeness, and I agree with the above. -   Georgianne Fick, FNP

## 2020-11-11 ENCOUNTER — Encounter (INDEPENDENT_AMBULATORY_CARE_PROVIDER_SITE_OTHER): Payer: Self-pay | Admitting: Family Medicine

## 2020-11-18 ENCOUNTER — Other Ambulatory Visit (INDEPENDENT_AMBULATORY_CARE_PROVIDER_SITE_OTHER): Payer: Self-pay | Admitting: Family Medicine

## 2020-11-18 DIAGNOSIS — I10 Essential (primary) hypertension: Secondary | ICD-10-CM

## 2020-11-18 MED ORDER — CHLORTHALIDONE 25 MG PO TABS: 12.5000 mg | ORAL_TABLET | Freq: Every day | ORAL | 0 refills | Status: DC

## 2020-11-18 NOTE — Telephone Encounter (Signed)
Refill request pt has f/u 11/27/20

## 2020-11-27 ENCOUNTER — Encounter (INDEPENDENT_AMBULATORY_CARE_PROVIDER_SITE_OTHER): Payer: Self-pay | Admitting: Family Medicine

## 2020-11-27 ENCOUNTER — Ambulatory Visit (INDEPENDENT_AMBULATORY_CARE_PROVIDER_SITE_OTHER): Payer: 59 | Admitting: Family Medicine

## 2020-11-27 ENCOUNTER — Other Ambulatory Visit: Payer: Self-pay

## 2020-11-27 VITALS — BP 117/81 | HR 85 | Temp 98.3°F | Ht 64.0 in | Wt 179.0 lb

## 2020-11-27 DIAGNOSIS — Z6830 Body mass index (BMI) 30.0-30.9, adult: Secondary | ICD-10-CM

## 2020-11-27 DIAGNOSIS — I10 Essential (primary) hypertension: Secondary | ICD-10-CM

## 2020-11-27 DIAGNOSIS — E669 Obesity, unspecified: Secondary | ICD-10-CM

## 2020-11-27 DIAGNOSIS — F3289 Other specified depressive episodes: Secondary | ICD-10-CM

## 2020-11-27 MED ORDER — CHLORTHALIDONE 25 MG PO TABS: 12.5000 mg | ORAL_TABLET | Freq: Every day | ORAL | 0 refills | Status: DC

## 2020-11-27 MED ORDER — BUPROPION HCL ER (SR) 200 MG PO TB12: 200.0000 mg | ORAL_TABLET | Freq: Every day | ORAL | 0 refills | Status: DC

## 2020-11-27 MED ORDER — BUPROPION HCL ER (SR) 200 MG PO TB12: 200.0000 mg | ORAL_TABLET | Freq: Two times a day (BID) | ORAL | 0 refills | Status: DC

## 2020-11-28 NOTE — Progress Notes (Signed)
Chief Complaint:   OBESITY Ashlee Swanson is here to discuss her progress with her obesity treatment plan along with follow-up of her obesity related diagnoses. Ashlee Swanson is on the Category 2 Plan and keeping a food journal and adhering to recommended goals of 400-500 calories and 35 grams of protein at supper daily and states she is following her eating plan approximately 60% of the time. Ashlee Swanson states she is doing yoga for 60 minutes 2 times per week.  Today's visit was #: 70 Starting weight: 194 lbs Starting date: 09/07/2018 Today's weight: 179 lbs Today's date: 11/27/2020 Total lbs lost to date: 15 Total lbs lost since last in-office visit: 0  Interim History: Ashlee Swanson feels Thanksgiving and other unusual activities have gotten her out of the normal swing of things. She is up 2 lbs today.  Subjective:   1. Other depression with emotional eating  Ashlee Swanson has had sweets available due to the holiday. She notes cravings have increased especially at night.  2. Essential hypertension Ashlee Swanson's blood pressure is well controlled on chlorthalidone.  BP Readings from Last 3 Encounters:  11/27/20 117/81  11/06/20 122/77  10/16/20 132/80   Lab Results  Component Value Date   CREATININE 1.05 06/11/2020   CREATININE 1.04 (H) 01/18/2020   CREATININE 0.84 06/12/2019   Assessment/Plan:   1. Other depression with emotional eating   Ashlee Swanson agreed to increase bupropion to 200 mg BID with no refills. Orders and follow up as documented in patient record.   - buPROPion (WELLBUTRIN SR) 200 MG 12 hr tablet; Take 1 tablet (200 mg total) by mouth 2 (two) times daily.  Dispense: 60 tablet; Refill: 0  2. Essential hypertension  We will refill chlorthalidone for 1 month.  - chlorthalidone (HYGROTON) 25 MG tablet; Take 0.5 tablets (12.5 mg total) by mouth daily.  Dispense: 15 tablet; Refill: 0  3. Class 1 obesity with serious comorbidity and body mass index (BMI) of 30.0 to 30.9 in adult, unspecified  obesity type Ashlee Swanson is currently in the action stage of change. As such, her goal is to continue with weight loss efforts. She has agreed to the Category 2 Plan and keeping a food journal and adhering to recommended goals of 400-500 calories and 35 grams of protein at supper daily.   Exercise goals: As is.  Behavioral modification strategies: holiday eating strategies .  Ashlee Swanson has agreed to follow-up with our clinic in 4 weeks.   Objective:   Blood pressure 117/81, pulse 85, temperature 98.3 F (36.8 C), height 5\' 4"  (1.626 m), weight 179 lb (81.2 kg), SpO2 99 %. Body mass index is 30.73 kg/m.  General: Cooperative, alert, well developed, in no acute distress. HEENT: Conjunctivae and lids unremarkable. Cardiovascular: Regular rhythm.  Lungs: Normal work of breathing. Neurologic: No focal deficits.   Lab Results  Component Value Date   CREATININE 1.05 06/11/2020   BUN 20 06/11/2020   NA 140 06/11/2020   K 4.4 06/11/2020   CL 106 06/11/2020   CO2 29 06/11/2020   Lab Results  Component Value Date   ALT 11 06/11/2020   AST 14 06/11/2020   ALKPHOS 58 06/11/2020   BILITOT 0.5 06/11/2020   Lab Results  Component Value Date   HGBA1C 5.0 01/18/2020   HGBA1C 5.2 06/12/2019   HGBA1C 5.2 09/07/2018   Lab Results  Component Value Date   INSULIN 7.0 01/18/2020   INSULIN 6.8 09/07/2018   Lab Results  Component Value Date   TSH 1.49 06/11/2020  Lab Results  Component Value Date   CHOL 178 06/11/2020   HDL 42.40 06/11/2020   LDLCALC 121 (H) 06/11/2020   TRIG 70.0 06/11/2020   CHOLHDL 4 06/11/2020   Lab Results  Component Value Date   WBC 7.4 06/11/2020   HGB 14.2 06/11/2020   HCT 41.8 06/11/2020   MCV 99.9 06/11/2020   PLT 352.0 06/11/2020   No results found for: IRON, TIBC, FERRITIN  Attestation Statements:   Reviewed by clinician on day of visit: allergies, medications, problem list, medical history, surgical history, family history, social history, and  previous encounter notes.   Wilhemena Durie, am acting as Location manager for [employer], FNP-C.  I have reviewed the above documentation for accuracy and completeness, and I agree with the above. -  Georgianne Fick, FNP

## 2020-12-02 ENCOUNTER — Encounter (INDEPENDENT_AMBULATORY_CARE_PROVIDER_SITE_OTHER): Payer: Self-pay | Admitting: Family Medicine

## 2020-12-11 ENCOUNTER — Encounter (INDEPENDENT_AMBULATORY_CARE_PROVIDER_SITE_OTHER): Payer: Self-pay

## 2020-12-11 ENCOUNTER — Other Ambulatory Visit (INDEPENDENT_AMBULATORY_CARE_PROVIDER_SITE_OTHER): Payer: Self-pay | Admitting: Family Medicine

## 2020-12-11 DIAGNOSIS — I10 Essential (primary) hypertension: Secondary | ICD-10-CM

## 2020-12-11 NOTE — Telephone Encounter (Signed)
This patient was last seen by Jake Bathe, FNP and currently has an upcoming appt scheduled on 12/26/20 with her.

## 2020-12-11 NOTE — Telephone Encounter (Signed)
Called the patient at 10:00am on 12/11/20 and left a my chart message on 12/11/20 at 10:52am

## 2020-12-21 ENCOUNTER — Other Ambulatory Visit (INDEPENDENT_AMBULATORY_CARE_PROVIDER_SITE_OTHER): Payer: Self-pay | Admitting: Family Medicine

## 2020-12-21 DIAGNOSIS — F3289 Other specified depressive episodes: Secondary | ICD-10-CM

## 2020-12-23 NOTE — Telephone Encounter (Signed)
This patient was last seen by Adah Salvage, FNP and currently has an upcoming appt scheduled on 12/26/20 with her.

## 2020-12-26 ENCOUNTER — Encounter (INDEPENDENT_AMBULATORY_CARE_PROVIDER_SITE_OTHER): Payer: Self-pay | Admitting: Family Medicine

## 2020-12-26 ENCOUNTER — Telehealth (INDEPENDENT_AMBULATORY_CARE_PROVIDER_SITE_OTHER): Payer: 59 | Admitting: Family Medicine

## 2020-12-26 DIAGNOSIS — E669 Obesity, unspecified: Secondary | ICD-10-CM

## 2020-12-26 DIAGNOSIS — F3289 Other specified depressive episodes: Secondary | ICD-10-CM

## 2020-12-26 DIAGNOSIS — Z6830 Body mass index (BMI) 30.0-30.9, adult: Secondary | ICD-10-CM | POA: Diagnosis not present

## 2021-01-01 NOTE — Progress Notes (Signed)
TeleHealth Visit:  Due to the COVID-19 pandemic, this visit was completed with telemedicine (audio/video) technology to reduce patient and provider exposure as well as to preserve personal protective equipment.   Ashlee Swanson has verbally consented to this TeleHealth visit. The patient is located at home, the provider is located at the [facility] office. The participants in this visit include the listed provider and patient. Ashlee Swanson was unable to use realtime audiovisual technology today and the telehealth visit was conducted via telephone (23 minute call).   Chief Complaint: OBESITY Ashlee Swanson is here to discuss her progress with her obesity treatment plan along with follow-up of her obesity related diagnoses. Ashlee Swanson is on the Category 2 Plan and keeping a food journal and adhering to recommended goals of 400-500 calories and 35 grams of protein at supper daily and states she is following her eating plan approximately 60% of the time. Ashlee Swanson states she is doing yoga for 1 hour 1 time per week.   Today's visit was #: 42 Starting weight: 194 lbs Starting date: 09/07/2018  Interim History: Ashlee Swanson thinks she has gained 1 lb over the holidays. She is finally getting back to normal routine after the holidays. She weighs 182 lbs at home today. She had a dental surgery Monday and she is struggling to get in protein this week. Her exercise has been decreased over the past several weeks.  Subjective:   1. Other depression with emotional eating  Ashlee Swanson is on bupropion 200 mg BID for food cravings. Her cravings have been increased due to being off the plan.  Assessment/Plan:   1. Other depression with emotional eating  Behavior modification techniques were discussed today to help Ashlee Swanson deal with her emotional/non-hunger eating behaviors. Ashlee Swanson will continue bupropion as is. Orders and follow up as documented in patient record.   2. Class 1 obesity with serious comorbidity and body mass  index (BMI) of 30.0 to 30.9 in adult, unspecified obesity type Ashlee Swanson is currently in the action stage of change. As such, her goal is to continue with weight loss efforts. She has agreed to the Category 2 Plan.  I encouraged soft proteins.  Exercise goals: Continue normal exercise regimen.  Behavioral modification strategies: increasing lean protein intake and meal planning and cooking strategies.  Ashlee Swanson has agreed to follow-up with our clinic in 3 weeks.   Objective:   VITALS: Per patient if applicable, see vitals. GENERAL: Alert and in no acute distress. CARDIOPULMONARY: No increased WOB. Speaking in clear sentences.  PSYCH: Pleasant and cooperative. Speech normal rate and rhythm. Affect is appropriate. Insight and judgement are appropriate. Attention is focused, linear, and appropriate.  NEURO: Oriented as arrived to appointment on time with no prompting.   Lab Results  Component Value Date   CREATININE 1.05 06/11/2020   BUN 20 06/11/2020   NA 140 06/11/2020   K 4.4 06/11/2020   CL 106 06/11/2020   CO2 29 06/11/2020   Lab Results  Component Value Date   ALT 11 06/11/2020   AST 14 06/11/2020   ALKPHOS 58 06/11/2020   BILITOT 0.5 06/11/2020   Lab Results  Component Value Date   HGBA1C 5.0 01/18/2020   HGBA1C 5.2 06/12/2019   HGBA1C 5.2 09/07/2018   Lab Results  Component Value Date   INSULIN 7.0 01/18/2020   INSULIN 6.8 09/07/2018   Lab Results  Component Value Date   TSH 1.49 06/11/2020   Lab Results  Component Value Date   CHOL 178 06/11/2020  HDL 42.40 06/11/2020   LDLCALC 121 (H) 06/11/2020   TRIG 70.0 06/11/2020   CHOLHDL 4 06/11/2020   Lab Results  Component Value Date   WBC 7.4 06/11/2020   HGB 14.2 06/11/2020   HCT 41.8 06/11/2020   MCV 99.9 06/11/2020   PLT 352.0 06/11/2020   No results found for: IRON, TIBC, FERRITIN  Attestation Statements:   Reviewed by clinician on day of visit: allergies, medications, problem list, medical  history, surgical history, family history, social history, and previous encounter notes.   Trude Mcburney, am acting as Energy manager for [place], FNP-C.  I have reviewed the above documentation for accuracy and completeness, and I agree with the above. - Jesse Sans, FNP

## 2021-01-02 ENCOUNTER — Encounter (INDEPENDENT_AMBULATORY_CARE_PROVIDER_SITE_OTHER): Payer: Self-pay | Admitting: Family Medicine

## 2021-01-08 ENCOUNTER — Other Ambulatory Visit (INDEPENDENT_AMBULATORY_CARE_PROVIDER_SITE_OTHER): Payer: Self-pay | Admitting: Family Medicine

## 2021-01-08 DIAGNOSIS — I10 Essential (primary) hypertension: Secondary | ICD-10-CM

## 2021-01-08 NOTE — Telephone Encounter (Signed)
This patient was last seen by Dawn Whitmire,FNP and currently has an upcoming appt scheduled on 01/16/21 with her.  

## 2021-01-16 ENCOUNTER — Encounter (INDEPENDENT_AMBULATORY_CARE_PROVIDER_SITE_OTHER): Payer: Self-pay | Admitting: Family Medicine

## 2021-01-16 ENCOUNTER — Ambulatory Visit (INDEPENDENT_AMBULATORY_CARE_PROVIDER_SITE_OTHER): Payer: 59 | Admitting: Family Medicine

## 2021-01-16 ENCOUNTER — Other Ambulatory Visit: Payer: Self-pay

## 2021-01-16 VITALS — BP 134/75 | HR 76 | Temp 98.3°F | Ht 64.0 in | Wt 181.0 lb

## 2021-01-16 DIAGNOSIS — E7849 Other hyperlipidemia: Secondary | ICD-10-CM

## 2021-01-16 DIAGNOSIS — Z9189 Other specified personal risk factors, not elsewhere classified: Secondary | ICD-10-CM

## 2021-01-16 DIAGNOSIS — E669 Obesity, unspecified: Secondary | ICD-10-CM

## 2021-01-16 DIAGNOSIS — I10 Essential (primary) hypertension: Secondary | ICD-10-CM | POA: Diagnosis not present

## 2021-01-16 DIAGNOSIS — Z6831 Body mass index (BMI) 31.0-31.9, adult: Secondary | ICD-10-CM

## 2021-01-16 NOTE — Progress Notes (Signed)
The 10-year ASCVD risk score [name] DC [name], et al., 2013) is: 2.2%   Values used to calculate the score:     Age: 50 years     Sex: Female     Is Non-Hispanic African American: No     Diabetic: No     Tobacco smoker: No     Systolic Blood Pressure: 023 mmHg     Is BP treated: Yes     HDL Cholesterol: 42.4 mg/dL     Total Cholesterol: 178 mg/dL

## 2021-01-20 ENCOUNTER — Encounter (INDEPENDENT_AMBULATORY_CARE_PROVIDER_SITE_OTHER): Payer: Self-pay | Admitting: Family Medicine

## 2021-01-20 MED ORDER — CHLORTHALIDONE 25 MG PO TABS: 12.5000 mg | ORAL_TABLET | Freq: Every day | ORAL | 0 refills | Status: DC

## 2021-01-20 NOTE — Progress Notes (Signed)
Chief Complaint:   OBESITY Ashlee Swanson is here to discuss her progress with her obesity treatment plan along with follow-up of her obesity related diagnoses. Ashlee Swanson is on the Category 2 Plan and states she is following her eating plan approximately 70% of the time. Ashlee Swanson states she is doing yoga for 30-60 minutes 3-4 times per week, and doing cardio for 30-60 minutes 1-2 times per week.  Today's visit was #: 25 Starting weight: 194 lbs Starting date: 09/07/2018 Today's weight: 181 lbs Today's date: 01/16/2021 Total lbs lost to date: 13 Total lbs lost since last in-office visit: 0  Interim History: Ashlee Swanson is up 2 lbs. She has worked out more, but she feels her water and protein intake are down. Her weight has plateaued between 179-181 lbs for 6 months. She admits to going about 150 calories over on her snack calories most days.  Subjective:   1. Essential hypertension Ashlee Swanson's blood pressure is well controlled. She denies chest pain or shortness of breath.  BP Readings from Last 3 Encounters:  01/16/21 134/75  11/27/20 117/81  11/06/20 122/77   Lab Results  Component Value Date   CREATININE 1.05 06/11/2020   CREATININE 1.04 (H) 01/18/2020   CREATININE 0.84 06/12/2019   2. Other hyperlipidemia Ashlee Swanson's last LDL was elevated at 132, HDL slightly low, and triglycerides were within normal limits. She is not on statin, and her 10 year ASCVD risk score is 2.2%.   Lab Results  Component Value Date   ALT 11 06/11/2020   AST 14 06/11/2020   ALKPHOS 58 06/11/2020   BILITOT 0.5 06/11/2020   Lab Results  Component Value Date   CHOL 178 06/11/2020   HDL 42.40 06/11/2020   LDLCALC 121 (H) 06/11/2020   TRIG 70.0 06/11/2020   CHOLHDL 4 06/11/2020   3. At risk for heart disease Ashlee Swanson is at a higher than average risk for cardiovascular disease due to obesity, hypertension, and hyperlipidemia.   Assessment/Plan:   1. Essential hypertension We will refill chlorthalidone 25 mg q  daily #30 for 1 month.  2. Other hyperlipidemia  Ashlee Swanson will continue her meal plan, and will increase cardio to 150 minutes per week.   3. At risk for heart disease Ashlee Swanson was given approximately 15 minutes of coronary artery disease prevention counseling today. She is 50 y.o. female and has risk factors for heart disease including obesity. We discussed intensive lifestyle modifications today with an emphasis on specific weight loss instructions and strategies.   Repetitive spaced learning was employed today to elicit superior memory formation and behavioral change.  4. Class 1 obesity with serious comorbidity and body mass index (BMI) of 31.0 to 31.9 in adult, unspecified obesity type Ashlee Swanson is currently in the action stage of change. As such, her goal is to continue with weight loss efforts. She has agreed to the Category 2 Plan.   We discussed sticking to the plan very tightly for continued weight loss.  Exercise goals: For substantial health benefits, adults should do at least 150 minutes (2 hours and 30 minutes) a week of moderate-intensity, or 75 minutes (1 hour and 15 minutes) a week of vigorous-intensity aerobic physical activity, or an equivalent combination of moderate- and vigorous-intensity aerobic activity. Aerobic activity should be performed in episodes of at least 10 minutes, and preferably, it should be spread throughout the week.  Behavioral modification strategies: increasing lean protein intake and decreasing simple carbohydrates.  Ashlee Swanson has agreed to follow-up with our clinic in 3 weeks.  Objective:   Blood pressure 134/75, pulse 76, temperature 98.3 F (36.8 C), height 5\' 4"  (1.626 m), weight 181 lb (82.1 kg), SpO2 99 %. Body mass index is 31.07 kg/m.  General: Cooperative, alert, well developed, in no acute distress. HEENT: Conjunctivae and lids unremarkable. Cardiovascular: Regular rhythm.  Lungs: Normal work of breathing. Neurologic: No focal deficits.    Lab Results  Component Value Date   CREATININE 1.05 06/11/2020   BUN 20 06/11/2020   NA 140 06/11/2020   K 4.4 06/11/2020   CL 106 06/11/2020   CO2 29 06/11/2020   Lab Results  Component Value Date   ALT 11 06/11/2020   AST 14 06/11/2020   ALKPHOS 58 06/11/2020   BILITOT 0.5 06/11/2020   Lab Results  Component Value Date   HGBA1C 5.0 01/18/2020   HGBA1C 5.2 06/12/2019   HGBA1C 5.2 09/07/2018   Lab Results  Component Value Date   INSULIN 7.0 01/18/2020   INSULIN 6.8 09/07/2018   Lab Results  Component Value Date   TSH 1.49 06/11/2020   Lab Results  Component Value Date   CHOL 178 06/11/2020   HDL 42.40 06/11/2020   LDLCALC 121 (H) 06/11/2020   TRIG 70.0 06/11/2020   CHOLHDL 4 06/11/2020   Lab Results  Component Value Date   WBC 7.4 06/11/2020   HGB 14.2 06/11/2020   HCT 41.8 06/11/2020   MCV 99.9 06/11/2020   PLT 352.0 06/11/2020   No results found for: IRON, TIBC, FERRITIN  Attestation Statements:   Reviewed by clinician on day of visit: allergies, medications, problem list, medical history, surgical history, family history, social history, and previous encounter notes.   Wilhemena Durie, am acting as Location manager for [employer], FNP-C.  I have reviewed the above documentation for accuracy and completeness, and I agree with the above. -  Georgianne Fick, FNP

## 2021-01-27 ENCOUNTER — Emergency Department (INDEPENDENT_AMBULATORY_CARE_PROVIDER_SITE_OTHER)
Admission: EM | Admit: 2021-01-27 | Discharge: 2021-01-27 | Disposition: A | Discharge status: Home / Self Care | Payer: 59 | Source: Home / Self Care

## 2021-01-27 ENCOUNTER — Emergency Department (INDEPENDENT_AMBULATORY_CARE_PROVIDER_SITE_OTHER): Payer: 59

## 2021-01-27 ENCOUNTER — Emergency Department: Admit: 2021-01-27 | Discharge status: Absent without leave | Payer: Self-pay

## 2021-01-27 ENCOUNTER — Other Ambulatory Visit: Payer: Self-pay

## 2021-01-27 DIAGNOSIS — R071 Chest pain on breathing: Secondary | ICD-10-CM

## 2021-01-27 DIAGNOSIS — R109 Unspecified abdominal pain: Secondary | ICD-10-CM

## 2021-01-27 DIAGNOSIS — M549 Dorsalgia, unspecified: Secondary | ICD-10-CM

## 2021-01-27 DIAGNOSIS — W009XXA Unspecified fall due to ice and snow, initial encounter: Secondary | ICD-10-CM | POA: Diagnosis not present

## 2021-01-27 DIAGNOSIS — R0781 Pleurodynia: Secondary | ICD-10-CM

## 2021-01-27 HISTORY — DX: Essential (primary) hypertension: I10

## 2021-01-27 NOTE — ED Triage Notes (Signed)
Pt fell at home on ice and landed on her back on the pavement. Hand has slight bruising. Pt states she does not think she hit her head.

## 2021-01-27 NOTE — Discharge Instructions (Signed)
°  You may take 500mg  acetaminophen every 4-6 hours or in combination with ibuprofen 400-600mg  every 6-8 hours as needed for pain and inflammation. Alternate cool and warm compresses to help with pain.  Follow up with primary care later this week if not improving.   Call 911 or have someone drive you to the hospital if symptoms significantly worsening- worsening pain, or new symptoms develop- numbness or weakness in arms or legs, severe headache, change in vision, nausea/vomiting, or other new concerning symptoms develop.

## 2021-01-27 NOTE — ED Triage Notes (Signed)
Pt states she has not taken pain medications, hurts to breath in and raise arms

## 2021-01-27 NOTE — ED Provider Notes (Signed)
[name]    CSN: [id number] Arrival date & time: 01/27/21  1008      History   Chief Complaint Chief Complaint  Patient presents with  . Back Pain    Upper back     HPI Ashlee Swanson is a 50 y.o. female.   HPI Ashlee Swanson is a 50 y.o. female presenting to UC with c/o back pain radiating into bilateral flanks and axillary region with deep breathing and certain movements. Pain started this morning after slip and fall on ice at home.  Denies hitting head or LOC. Pain is throbbing, 6/10.  Denies weakness or numbness in arms or legs.  No medication or treatment tried PTA. Pt also noticed a bruise on her Left hand but unsure if that was there before or after the fall.  Minimal pain in the hand.    Past Medical History:  Diagnosis Date  . Arthritis    In the Back  . Back pain   . Bunion   . DDD (degenerative disc disease), lumbar   . High cholesterol   . Hypertension   . IBS (irritable bowel syndrome)   . Insomnia   . Kidney stone   . Lactose intolerance   . Snoring     Patient Active Problem List   Diagnosis Date Noted  . Depression 01/03/2020  . Preventative health care 12/12/2019  . Essential hypertension 11/28/2019  . Abscess 06/12/2019  . Need for tetanus booster 06/12/2019  . Hot flashes 06/12/2019  . Insulin resistance 04/12/2019  . Other hyperlipidemia 12/29/2018  . Vitamin D deficiency 12/12/2018  . Class 1 obesity with serious comorbidity and body mass index (BMI) of 31.0 to 31.9 in adult 12/12/2018  . Insomnia 05/28/2015  . HOT FLASHES 12/31/2010  . BUNION, RIGHT FOOT 12/31/2010  . NEVI, MULTIPLE 12/31/2008    Past Surgical History:  Procedure Laterality Date  . BUNIONECTOMY Left 1999   modified mcbride bunionectony    OB History    Gravida  1   Para  1   Term      Preterm      AB      Living        SAB      IAB      Ectopic      Multiple      Live Births               Home  Medications    Prior to Admission medications   Medication Sig Start Date End Date Taking? Authorizing Provider  buPROPion [facility] SR) 200 MG 12 hr tablet TAKE 1 TABLET BY MOUTH TWICE A DAY 12/23/20   Whitmire, Dawn W, FNP  chlorthalidone (HYGROTON) 25 MG tablet Take 0.5 tablets (12.5 mg total) by mouth daily. 01/20/21   Whitmire, Joneen Boers, FNP  cholecalciferol (VITAMIN D3) 25 MCG (1000 UT) tablet Take 1,000 Units by mouth 2 (two) times daily.    [provider]  Eszopiclone 3 MG TABS Take 1 tablet (3 mg total) by mouth at bedtime as needed. Take immediately before bedtime 10/03/20   Carollee Herter, Yvonne R, DO  Ibuprofen-diphenhydrAMINE Cit (ADVIL PM PO) Take by mouth.    [provider]  polyethylene glycol (MIRALAX / GLYCOLAX) packet Take 17 g by mouth daily.    [provider]    Family History Family History  Problem Relation Age of Onset  . Colon cancer Paternal Aunt   . Breast cancer Maternal Aunt   .  Heart disease Other   . Stroke Maternal Grandmother   . Hypertension Mother   . Hypertension Father   . Hypertension Brother   . Stroke Paternal Grandfather     Social History Social History   Tobacco Use  . Smoking status: Former Smoker    Packs/day: 0.25    Years: 10.00    Pack years: 2.50    Quit date: 05/29/2004    Years since quitting: 16.6  . Smokeless tobacco: Never Used  Substance Use Topics  . Alcohol use: Yes    Alcohol/week: 0.0 standard drinks    Comment: socially  . Drug use: No     Allergies   Minocycline, Codeine, and Tetracycline   Review of Systems Review of Systems  Eyes: Negative for photophobia and visual disturbance.  Respiratory: Negative for chest tightness and shortness of breath.   Cardiovascular: Positive for chest pain (sides). Negative for palpitations.  Musculoskeletal: Positive for back pain and myalgias. Negative for arthralgias, gait problem, joint swelling, neck pain and neck stiffness.  Skin: Positive  for color change. Negative for wound.  Neurological: Positive for light-headedness (mild). Negative for dizziness, syncope, weakness, numbness and headaches.     Physical Exam Triage Vital Signs ED Triage Vitals  Enc Vitals Group     BP 01/27/21 1021 119/79     Pulse Rate 01/27/21 1021 87     Resp 01/27/21 1021 15     Temp 01/27/21 1021 98.1 F (36.7 C)     Temp Source 01/27/21 1021 Oral     SpO2 01/27/21 1021 98 %     Weight 01/27/21 1018 180 lb (81.6 kg)     Height 01/27/21 1018 5\' 4"  (1.626 m)     Head Circumference --      Peak Flow --      Pain Score 01/27/21 1017 6     Pain Loc --      Pain Edu? --      Excl. in [place]? --    No data found.  Updated Vital Signs BP 119/79 (BP Location: Right Arm)   Pulse 87   Temp 98.1 F (36.7 C) (Oral)   Resp 15   Ht 5\' 4"  (1.626 m)   Wt 180 lb (81.6 kg)   SpO2 98%   BMI 30.90 kg/m   Visual Acuity Right Eye Distance:   Left Eye Distance:   Bilateral Distance:    Right Eye Near:   Left Eye Near:    Bilateral Near:     Physical Exam Vitals and nursing note reviewed.  Constitutional:      General: She is not in acute distress.    Appearance: Normal appearance. She is well-developed and well-nourished. She is not ill-appearing, toxic-appearing or diaphoretic.  HENT:     Head: Normocephalic and atraumatic.     Right Ear: Tympanic membrane and ear canal normal.     Left Ear: Tympanic membrane and ear canal normal.     Nose: Nose normal.     Mouth/Throat:     Mouth: Mucous membranes are moist.     Pharynx: Oropharynx is clear.  Eyes:     Extraocular Movements: Extraocular movements intact and EOM normal.     Conjunctiva/sclera: Conjunctivae normal.     Pupils: Pupils are equal, round, and reactive to light.  Neck:     Comments: No midline bone tenderness, no crepitus or step-offs.  Cardiovascular:     Rate and Rhythm: Normal rate and regular rhythm.  Pulmonary:  Effort: Pulmonary effort is normal. No respiratory  distress.     Breath sounds: Normal breath sounds. No stridor. No wheezing, rhonchi or rales.    Chest:     Chest wall: Tenderness present.    Musculoskeletal:        General: No tenderness. Normal range of motion.     Cervical back: Normal range of motion and neck supple. No rigidity or tenderness. No spinous process tenderness or muscular tenderness. Normal range of motion.     Comments: No spinal tenderness. Full ROM upper and lower extremities bilaterally.  No bony tenderness to shoulders or hips.   Skin:    General: Skin is warm and dry.     Capillary Refill: Capillary refill takes less than 2 seconds.     Findings: Bruising present.     Comments: Left hand: skin in tact. Faint ecchymosis to palm of hand. Full ROM wrist and fingers. No snuffbox tenderness.   Neurological:     General: No focal deficit present.     Mental Status: She is alert and oriented to person, place, and time.     Cranial Nerves: No cranial nerve deficit.     Sensory: No sensory deficit.     Motor: No weakness.     Coordination: Coordination normal.     Gait: Gait normal.  Psychiatric:        Mood and Affect: Mood and affect and mood normal.        Behavior: Behavior normal.      UC Treatments / Results  Labs (all labs ordered are listed, but only abnormal results are displayed) Labs Reviewed - No data to display  EKG   Radiology DG Chest 2 View  Result Date: 01/27/2021 CLINICAL DATA:  Fall today with bilateral ribs and chest pain. Insert initial EXAM: CHEST - 2 VIEW COMPARISON:  None. FINDINGS: The heart size and mediastinal contours are within normal limits. There is no evidence of pulmonary edema, consolidation, pneumothorax, nodule or pleural fluid. The visualized skeletal structures are unremarkable. IMPRESSION: No active cardiopulmonary disease. Electronically Signed   By: Aletta Edouard M.D.   On: 01/27/2021 10:52    Procedures Procedures (including critical care time)  Medications  Ordered in UC Medications - No data to display  Initial Impression / Assessment and Plan / UC Course  I have reviewed the triage vital signs and the nursing notes.  Pertinent labs & imaging results that were available during my care of the patient were reviewed by me and considered in my medical decision making (see chart for details).    Pt declined pain medication in UC No neck or spinal tenderness. No localized bony tenderness. Full ROM upper and lower extremities. Normal neuro exam. Reassured pt of normal CXR Home care discussed F/u with PCP later this week if not improving. Discussed symptoms that warrant emergent care in the ED.   Final Clinical Impressions(s) / UC Diagnoses   Final diagnoses:  Fall due to slipping on ice or snow, initial encounter  Upper back pain  Bilateral flank pain  Chest pain varying with breathing     Discharge Instructions      You may take 500mg  acetaminophen every 4-6 hours or in combination with ibuprofen 400-600mg  every 6-8 hours as needed for pain and inflammation. Alternate cool and warm compresses to help with pain.  Follow up with primary care later this week if not improving.   Call 911 or have someone drive you to the hospital if  symptoms significantly worsening- worsening pain, or new symptoms develop- numbness or weakness in arms or legs, severe headache, change in vision, nausea/vomiting, or other new concerning symptoms develop.      ED Prescriptions    None     PDMP not reviewed this encounter.   Noe Gens, [place] 01/27/21 1208

## 2021-01-29 ENCOUNTER — Ambulatory Visit (INDEPENDENT_AMBULATORY_CARE_PROVIDER_SITE_OTHER): Payer: 59 | Admitting: Family Medicine

## 2021-02-04 ENCOUNTER — Ambulatory Visit (INDEPENDENT_AMBULATORY_CARE_PROVIDER_SITE_OTHER): Payer: 59 | Admitting: Family Medicine

## 2021-02-04 ENCOUNTER — Other Ambulatory Visit: Payer: Self-pay

## 2021-02-04 ENCOUNTER — Encounter (INDEPENDENT_AMBULATORY_CARE_PROVIDER_SITE_OTHER): Payer: Self-pay | Admitting: Family Medicine

## 2021-02-04 VITALS — BP 148/79 | HR 96 | Temp 98.5°F | Ht 64.0 in | Wt 180.0 lb

## 2021-02-04 DIAGNOSIS — E669 Obesity, unspecified: Secondary | ICD-10-CM

## 2021-02-04 DIAGNOSIS — Z6834 Body mass index (BMI) 34.0-34.9, adult: Secondary | ICD-10-CM

## 2021-02-04 DIAGNOSIS — I10 Essential (primary) hypertension: Secondary | ICD-10-CM

## 2021-02-05 NOTE — Progress Notes (Signed)
Chief Complaint:   OBESITY Ashlee Swanson is here to discuss her progress with her obesity treatment plan along with follow-up of her obesity related diagnoses. Ashlee Swanson is on the Category 2 Plan and states she is following her eating plan approximately 80% of the time. Ashlee Swanson states she is doing 0 minutes 0 times per week.  Today's visit was #: 26 Starting weight: 194 lbs Starting date: 09/07/2018 Today's weight: 180 lbs Today's date: 02/04/2021 Total lbs lost to date: 14 Total lbs lost since last in-office visit: 1  Interim History: Ashlee Swanson has been unable to work out due to injuries from a slip and fall on ice. Her eating has been on  plan. Her protein intake has improved. Her hunger is satisfied. However, her extra calories are sometimes over 200 per day. She generally likes to have cereal as a bedtime snack.  Subjective:   1. Essential hypertension Ashlee Swanson's blood pressure is elevated today at 148/79. It has been well controlled at prior visits. . 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 She is on chlorthalidone 25 mg q daily.  BP Readings from Last 3 Encounters:  02/04/21 (!) 148/79  01/27/21 119/79  01/16/21 134/75   Lab Results  Component Value Date   CREATININE 1.05 06/11/2020   CREATININE 1.04 (H) 01/18/2020   CREATININE 0.84 06/12/2019   Assessment/Plan:   1. Essential hypertension Ashlee Swanson will continue chlorthalidone 25 mg daily.  2. Class 1 obesity with serious comorbidity and body mass index (BMI) of 34.0 to 34.9 in adult, unspecified obesity type Ashlee Swanson is currently in the action stage of change. As such, her goal is to continue with weight loss efforts. She has agreed to the Category 2 Plan.   Find lower calorie cereal for snack to keep snack calories <200.  Exercise goals: Restart exercise when she is able.  Behavioral modification strategies: better snacking choices.  Ashlee Swanson has agreed to follow-up with our clinic in 3 weeks.   Objective:    Blood pressure (!) 148/79, pulse 96, temperature 98.5 F (36.9 C), height 5\' 4"  (1.626 m), weight 180 lb (81.6 kg), SpO2 96 %. Body mass index is 30.9 kg/m.  General: Cooperative, alert, well developed, in no acute distress. HEENT: Conjunctivae and lids unremarkable. Cardiovascular: Regular rhythm.  Lungs: Normal work of breathing. Neurologic: No focal deficits.   Lab Results  Component Value Date   CREATININE 1.05 06/11/2020   BUN 20 06/11/2020   NA 140 06/11/2020   K 4.4 06/11/2020   CL 106 06/11/2020   CO2 29 06/11/2020   Lab Results  Component Value Date   ALT 11 06/11/2020   AST 14 06/11/2020   ALKPHOS 58 06/11/2020   BILITOT 0.5 06/11/2020   Lab Results  Component Value Date   HGBA1C 5.0 01/18/2020   HGBA1C 5.2 06/12/2019   HGBA1C 5.2 09/07/2018   Lab Results  Component Value Date   INSULIN 7.0 01/18/2020   INSULIN 6.8 09/07/2018   Lab Results  Component Value Date   TSH 1.49 06/11/2020   Lab Results  Component Value Date   CHOL 178 06/11/2020   HDL 42.40 06/11/2020   LDLCALC 121 (H) 06/11/2020   TRIG 70.0 06/11/2020   CHOLHDL 4 06/11/2020   Lab Results  Component Value Date   WBC 7.4 06/11/2020   HGB 14.2 06/11/2020   HCT 41.8 06/11/2020   MCV 99.9 06/11/2020   PLT 352.0 06/11/2020   No results found for: IRON, TIBC, FERRITIN  Attestation Statements:   Reviewed by clinician on day of visit: allergies, medications, problem list, medical history, surgical history, family history, social history, and previous encounter notes.   Wilhemena Durie, am acting as Location manager for [employer], FNP-C.  I have reviewed the above documentation for accuracy and completeness, and I agree with the above. -  Georgianne Fick, FNP

## 2021-02-15 ENCOUNTER — Other Ambulatory Visit (INDEPENDENT_AMBULATORY_CARE_PROVIDER_SITE_OTHER): Payer: Self-pay | Admitting: Family Medicine

## 2021-02-15 DIAGNOSIS — I10 Essential (primary) hypertension: Secondary | ICD-10-CM

## 2021-02-17 NOTE — Telephone Encounter (Signed)
Last seen by Dawn Whitmire, FNP 

## 2021-02-17 NOTE — Telephone Encounter (Signed)
Refill request

## 2021-02-24 ENCOUNTER — Encounter (INDEPENDENT_AMBULATORY_CARE_PROVIDER_SITE_OTHER): Payer: Self-pay | Admitting: Family Medicine

## 2021-02-24 ENCOUNTER — Ambulatory Visit (INDEPENDENT_AMBULATORY_CARE_PROVIDER_SITE_OTHER): Payer: 59 | Admitting: Family Medicine

## 2021-02-24 ENCOUNTER — Other Ambulatory Visit: Payer: Self-pay

## 2021-02-24 VITALS — BP 132/83 | HR 85 | Temp 98.5°F | Ht 64.0 in | Wt 179.0 lb

## 2021-02-24 DIAGNOSIS — E669 Obesity, unspecified: Secondary | ICD-10-CM

## 2021-02-24 DIAGNOSIS — I10 Essential (primary) hypertension: Secondary | ICD-10-CM

## 2021-02-24 DIAGNOSIS — Z6830 Body mass index (BMI) 30.0-30.9, adult: Secondary | ICD-10-CM | POA: Diagnosis not present

## 2021-02-25 ENCOUNTER — Encounter (INDEPENDENT_AMBULATORY_CARE_PROVIDER_SITE_OTHER): Payer: Self-pay | Admitting: Family Medicine

## 2021-02-25 NOTE — Progress Notes (Signed)
Chief Complaint:   OBESITY Terie is here to discuss her progress with her obesity treatment plan along with follow-up of her obesity related diagnoses. Marge is on the Category 2 Plan and states she is following her eating plan approximately 75% of the time. Mayci states she is doing yoga for 60 minutes 2 times per week.  Today's visit was #: 17 Starting weight: 194 lbs Starting date: 09/07/2018 Today's weight: 179 lbs Today's date: 02/24/2021 Total lbs lost to date: 15 lbs Total lbs lost since last in-office visit: 1 lb  Interim History:  Murriel has been able to resume exercise.  She had a fall a few weeks ago and was very sore.  She notes eating has been mostly on plan except for dinner of mac & cheese.  She is actually cooking fish and vegetables for lunch.  Goal is 175 pounds by July.  Subjective:   1. Essential hypertension Well controlled.  Blood pressure elevated at last office visit.  She is on chlorthalidone.   BP Readings from Last 3 Encounters:  02/24/21 132/83  02/04/21 (!) 148/79  01/27/21 119/79   Assessment/Plan:   1. Essential hypertension Continue chlorthalidone.   2. Class 1 obesity with serious comorbidity and body mass index (BMI) of 30.0 to 30.9 in adult, unspecified obesity type  Eastyn is currently in the action stage of change. As such, her goal is to continue with weight loss efforts. She has agreed to the Category 2 Plan.   Exercise goals: She is considering joining a gym and I encouraged her to add cardio.  Behavioral modification strategies: planning for success.  Rosely has agreed to follow-up with our clinic in 3 weeks.  Objective:   Blood pressure 132/83, pulse 85, temperature 98.5 F (36.9 C), height _0  (1.626 m), weight 179 lb (81.2 kg), SpO2 98 %. Body mass index is 30.73 kg/m.  General: Cooperative, alert, well developed, in no acute distress. HEENT: Conjunctivae and lids unremarkable. Cardiovascular: Regular rhythm.   Lungs: Normal work of breathing. Neurologic: No focal deficits.   Lab Results  Component Value Date   CREATININE 1.05 06/11/2020   BUN 20 06/11/2020   NA 140 06/11/2020   K 4.4 06/11/2020   CL 106 06/11/2020   CO2 29 06/11/2020   Lab Results  Component Value Date   ALT 11 06/11/2020   AST 14 06/11/2020   ALKPHOS 58 06/11/2020   BILITOT 0.5 06/11/2020   Lab Results  Component Value Date   HGBA1C 5.0 01/18/2020   HGBA1C 5.2 06/12/2019   HGBA1C 5.2 09/07/2018   Lab Results  Component Value Date   INSULIN 7.0 01/18/2020   INSULIN 6.8 09/07/2018   Lab Results  Component Value Date   TSH 1.49 06/11/2020   Lab Results  Component Value Date   CHOL 178 06/11/2020   HDL 42.40 06/11/2020   LDLCALC 121 (H) 06/11/2020   TRIG 70.0 06/11/2020   CHOLHDL 4 06/11/2020   Lab Results  Component Value Date   WBC 7.4 06/11/2020   HGB 14.2 06/11/2020   HCT 41.8 06/11/2020   MCV 99.9 06/11/2020   PLT 352.0 06/11/2020   Attestation Statements:   Reviewed by clinician on day of visit: allergies, medications, problem list, medical history, surgical history, family history, social history, and previous encounter notes.  I, Water quality scientist, CMA, am acting as Location manager for [employer], [place].  I have reviewed the above documentation for accuracy and completeness, and I agree with the above. -  Georgianne Fick, FNP

## 2021-03-14 ENCOUNTER — Other Ambulatory Visit (INDEPENDENT_AMBULATORY_CARE_PROVIDER_SITE_OTHER): Payer: Self-pay | Admitting: Family Medicine

## 2021-03-14 DIAGNOSIS — I10 Essential (primary) hypertension: Secondary | ICD-10-CM

## 2021-03-17 ENCOUNTER — Other Ambulatory Visit: Payer: Self-pay

## 2021-03-17 ENCOUNTER — Encounter (INDEPENDENT_AMBULATORY_CARE_PROVIDER_SITE_OTHER): Payer: Self-pay | Admitting: Family Medicine

## 2021-03-17 ENCOUNTER — Ambulatory Visit (INDEPENDENT_AMBULATORY_CARE_PROVIDER_SITE_OTHER): Payer: 59 | Admitting: Family Medicine

## 2021-03-17 VITALS — BP 139/77 | HR 94 | Temp 98.9°F | Ht 64.0 in | Wt 177.0 lb

## 2021-03-17 DIAGNOSIS — E669 Obesity, unspecified: Secondary | ICD-10-CM

## 2021-03-17 DIAGNOSIS — I10 Essential (primary) hypertension: Secondary | ICD-10-CM | POA: Diagnosis not present

## 2021-03-17 DIAGNOSIS — F3289 Other specified depressive episodes: Secondary | ICD-10-CM

## 2021-03-17 DIAGNOSIS — Z6833 Body mass index (BMI) 33.0-33.9, adult: Secondary | ICD-10-CM | POA: Diagnosis not present

## 2021-03-17 MED ORDER — CHLORTHALIDONE 25 MG PO TABS: 12.5000 mg | ORAL_TABLET | Freq: Every day | ORAL | 0 refills | Status: DC

## 2021-03-18 ENCOUNTER — Encounter (INDEPENDENT_AMBULATORY_CARE_PROVIDER_SITE_OTHER): Payer: Self-pay | Admitting: Family Medicine

## 2021-03-18 NOTE — Progress Notes (Signed)
Chief Complaint:   OBESITY Ashlee Swanson is here to discuss her progress with her obesity treatment plan along with follow-up of her obesity related diagnoses. [name] is on the Category 2 Plan and states she is following her eating plan approximately 80% of the time. [name] states she is doing yoga 60 minutes 2 times per week.  Today's visit was #: 73 Starting weight: 194 lbs Starting date: 09/07/2018 Today's weight: 177 lbs Today's date: 03/17/2021 Total lbs lost to date: 17 lbs Total lbs lost since last in-office visit: 2 lbs  Interim History: Ashlee Swanson has had a good few weeks and is down 2 lbs. She will be going on a trip to [place] April 10-13, 2022 and worries about staying on the plan. She feels she needs to drink more water. She does journal and has kept calories around 1300.  Subjective:   1. Essential hypertension [name]'s BP is borderline today but mostly controlled (139/77). She is on Hygro[place] 25 mg 1/2 tab QD.  BP Readings from Last 3 Encounters:  03/17/21 139/77  02/24/21 132/83  02/04/21 (!) 148/79   2. Other depression with emotional eating  Icis is on Wellbutrin 200 mg QD. Her cravings are fairly controlled.   Assessment/Plan:   1. Essential hypertension Refill:  - chlorthalidone (HYGROTON) 25 MG tablet; Take 0.5 tablets (12.5 mg total) by mouth daily.  Dispense: 15 tablet; Refill: 0  2. Other depression with emotional eating   Continue Wellbutrin.  3. Obesity: BMI 30 [name] is currently in the action stage of change. As such, her goal is to continue with weight loss efforts. She has agreed to the Category 2 Plan.   Discussed having spaghetti on a healthy way using [employer].  Exercise goals: As is  Behavioral modification strategies: increasing water intake and meal planning and cooking strategies.  [name] has agreed to follow-up with our clinic in 3 weeks.  Objective:   Blood pressure 139/77, pulse 94, temperature 98.9 F (37.2 C), height 5'  4" (1.626 m), weight 177 lb (80.3 kg), SpO2 97 %. Body mass index is 30.38 kg/m.  General: Cooperative, alert, well developed, in no acute distress. HEENT: Conjunctivae and lids unremarkable. Cardiovascular: Regular rhythm.  Lungs: Normal work of breathing. Neurologic: No focal deficits.   Lab Results  Component Value Date   CREATININE 1.05 06/11/2020   BUN 20 06/11/2020   NA 140 06/11/2020   K 4.4 06/11/2020   CL 106 06/11/2020   CO2 29 06/11/2020   Lab Results  Component Value Date   ALT 11 06/11/2020   AST 14 06/11/2020   ALKPHOS 58 06/11/2020   BILITOT 0.5 06/11/2020   Lab Results  Component Value Date   HGBA1C 5.0 01/18/2020   HGBA1C 5.2 06/12/2019   HGBA1C 5.2 09/07/2018   Lab Results  Component Value Date   INSULIN 7.0 01/18/2020   INSULIN 6.8 09/07/2018   Lab Results  Component Value Date   TSH 1.49 06/11/2020   Lab Results  Component Value Date   CHOL 178 06/11/2020   HDL 42.40 06/11/2020   LDLCALC 121 (H) 06/11/2020   TRIG 70.0 06/11/2020   CHOLHDL 4 06/11/2020   Lab Results  Component Value Date   WBC 7.4 06/11/2020   HGB 14.2 06/11/2020   HCT 41.8 06/11/2020   MCV 99.9 06/11/2020   PLT 352.0 06/11/2020    Attestation Statements:   Reviewed by clinician on day of visit: allergies, medications, problem list, medical history, surgical history, family  history, social history, and previous encounter notes.  Coral Ceo, am acting as Location manager for [employer], [place].  I have reviewed the above documentation for accuracy and completeness, and I agree with the above. -  Georgianne Fick, FNP

## 2021-04-09 ENCOUNTER — Encounter (INDEPENDENT_AMBULATORY_CARE_PROVIDER_SITE_OTHER): Payer: Self-pay | Admitting: Family Medicine

## 2021-04-09 ENCOUNTER — Other Ambulatory Visit: Payer: Self-pay

## 2021-04-09 ENCOUNTER — Ambulatory Visit (INDEPENDENT_AMBULATORY_CARE_PROVIDER_SITE_OTHER): Payer: 59 | Admitting: Family Medicine

## 2021-04-09 VITALS — BP 132/81 | HR 78 | Temp 98.2°F | Ht 64.0 in | Wt 178.0 lb

## 2021-04-09 DIAGNOSIS — E669 Obesity, unspecified: Secondary | ICD-10-CM

## 2021-04-09 DIAGNOSIS — I10 Essential (primary) hypertension: Secondary | ICD-10-CM | POA: Diagnosis not present

## 2021-04-09 DIAGNOSIS — Z6833 Body mass index (BMI) 33.0-33.9, adult: Secondary | ICD-10-CM | POA: Diagnosis not present

## 2021-04-10 NOTE — Progress Notes (Signed)
     Chief Complaint:   OBESITY Ashlee Swanson is here to discuss her progress with her obesity treatment plan along with follow-up of her obesity related diagnoses. Ashlee Swanson is on the Category 2 Plan and states she is following her eating plan approximately 75% of the time. Ashlee Swanson states she is doing yoga for 60 minutes 2 times per week.  Today's visit was #: 44  Starting weight: 194 lbs Starting date: 09/07/2018 Today's weight: 178 lbs Today's date: 04/09/2021 Total lbs lost to date: 16 lbs Total lbs lost since last in-office visit: 0  Interim History: Ashlee Swanson just got back from a trip to [place], [place].  She is not surprised with her 1 pound weight gain.  She notes she did quite a bit of snacking.  She says she was not on plan well before or after the trip, however.  She did a lot of walking in [place].  Subjective:   1. Essential hypertension Well controlled on chlorthalidone.  BP Readings from Last 3 Encounters:  04/09/21 132/81  03/17/21 139/77  02/24/21 132/83   Assessment/Plan:   1. Essential hypertension Continue chlorthalidone.   2. Obesity: BMI 30  Ashlee Swanson is currently in the action stage of change. As such, her goal is to continue with weight loss efforts. She has agreed to the Category 2 Plan.   Exercise goals: Add cardio for 30 minutes 3 times per week and continue yoga 2 times per week.  Behavioral modification strategies: decreasing simple carbohydrates.  Ashlee Swanson has agreed to follow-up with our clinic in 4 weeks.  Objective:   Blood pressure 132/81, pulse 78, temperature 98.2 F (36.8 C), height 5\' 4"  (1.626 m), weight 178 lb (80.7 kg), SpO2 97 %. Body mass index is 30.55 kg/m.  General: Cooperative, alert, well developed, in no acute distress. HEENT: Conjunctivae and lids unremarkable. Cardiovascular: Regular rhythm.  Lungs: Normal work of breathing. Neurologic: No focal deficits.   Lab Results  Component Value Date   CREATININE 1.05 06/11/2020   BUN 20  06/11/2020   NA 140 06/11/2020   K 4.4 06/11/2020   CL 106 06/11/2020   CO2 29 06/11/2020   Lab Results  Component Value Date   ALT 11 06/11/2020   AST 14 06/11/2020   ALKPHOS 58 06/11/2020   BILITOT 0.5 06/11/2020   Lab Results  Component Value Date   HGBA1C 5.0 01/18/2020   HGBA1C 5.2 06/12/2019   HGBA1C 5.2 09/07/2018   Lab Results  Component Value Date   INSULIN 7.0 01/18/2020   INSULIN 6.8 09/07/2018   Lab Results  Component Value Date   TSH 1.49 06/11/2020   Lab Results  Component Value Date   CHOL 178 06/11/2020   HDL 42.40 06/11/2020   LDLCALC 121 (H) 06/11/2020   TRIG 70.0 06/11/2020   CHOLHDL 4 06/11/2020   Lab Results  Component Value Date   WBC 7.4 06/11/2020   HGB 14.2 06/11/2020   HCT 41.8 06/11/2020   MCV 99.9 06/11/2020   PLT 352.0 06/11/2020   Attestation Statements:   Reviewed by clinician on day of visit: allergies, medications, problem list, medical history, surgical history, family history, social history, and previous encounter notes.  I, Water quality scientist, CMA, am acting as Location manager for [employer], [place].  I have reviewed the above documentation for accuracy and completeness, and I agree with the above. -  Georgianne Fick, FNP

## 2021-04-14 ENCOUNTER — Encounter (INDEPENDENT_AMBULATORY_CARE_PROVIDER_SITE_OTHER): Payer: Self-pay | Admitting: Family Medicine

## 2021-04-16 ENCOUNTER — Other Ambulatory Visit (INDEPENDENT_AMBULATORY_CARE_PROVIDER_SITE_OTHER): Payer: Self-pay | Admitting: Family Medicine

## 2021-04-16 DIAGNOSIS — I10 Essential (primary) hypertension: Secondary | ICD-10-CM

## 2021-04-16 NOTE — Telephone Encounter (Signed)
Refill request

## 2021-05-07 ENCOUNTER — Encounter (INDEPENDENT_AMBULATORY_CARE_PROVIDER_SITE_OTHER): Payer: Self-pay | Admitting: Family Medicine

## 2021-05-07 ENCOUNTER — Ambulatory Visit (INDEPENDENT_AMBULATORY_CARE_PROVIDER_SITE_OTHER): Payer: 59 | Admitting: Family Medicine

## 2021-05-07 ENCOUNTER — Other Ambulatory Visit: Payer: Self-pay

## 2021-05-07 VITALS — BP 126/71 | HR 84 | Temp 98.7°F | Ht 64.0 in | Wt 175.0 lb

## 2021-05-07 DIAGNOSIS — E669 Obesity, unspecified: Secondary | ICD-10-CM

## 2021-05-07 DIAGNOSIS — Z6833 Body mass index (BMI) 33.0-33.9, adult: Secondary | ICD-10-CM

## 2021-05-07 DIAGNOSIS — I10 Essential (primary) hypertension: Secondary | ICD-10-CM

## 2021-05-07 DIAGNOSIS — F3289 Other specified depressive episodes: Secondary | ICD-10-CM

## 2021-05-07 MED ORDER — CHLORTHALIDONE 25 MG PO TABS
12.5000 mg | ORAL_TABLET | Freq: Every day | ORAL | 0 refills | Status: DC
Start: 2021-05-07 — End: 2021-06-24

## 2021-05-07 MED ORDER — BUPROPION HCL ER (SR) 200 MG PO TB12
200.0000 mg | ORAL_TABLET | Freq: Two times a day (BID) | ORAL | 0 refills | Status: DC
Start: 2021-05-07 — End: 2021-09-16

## 2021-05-07 NOTE — Progress Notes (Signed)
Chief Complaint:   OBESITY Season is here to discuss her progress with her obesity treatment plan along with follow-up of her obesity related diagnoses. [name] is on the Category 2 Plan and states she is following her eating plan approximately 80% of the time. Ashlee Swanson states she is doing 0 minutes 0 times per week.  Today's visit was #: 22 Starting weight: 194 lbs Starting date: 09/07/2018 Today's weight: 175 lbs Today's date: 05/07/2021 Total lbs lost to date: 19 Total lbs lost since last in-office visit: 3  Interim History: Ashlee Swanson made a goal to lose to 175 lbs by July, and she met that goal today. She does tend to have a bowl of cereal (300 calories) some nights. She does not snack during the day.  She has not started back exercising consistently.  Subjective:   1. Essential hypertension Ashlee Swanson's blood pressure is well controlled on chlorthalidone.   BP Readings from Last 3 Encounters:  05/07/21 126/71  04/09/21 132/81  03/17/21 139/77   Lab Results  Component Value Date   CREATININE 1.05 06/11/2020   CREATININE 1.04 (H) 01/18/2020   CREATININE 0.84 06/12/2019   2. Other depression with emotional eating  Ashlee Swanson's mood is stable, but she still has cravings at night.  Assessment/Plan:   1. Essential hypertension  We will refill chlorthalidone for 1 month.  - chlorthalidone (HYGROTON) 25 MG tablet; Take 0.5 tablets (12.5 mg total) by mouth daily.  Dispense: 15 tablet; Refill: 0  2. Other depression with emotional eating   We will refill bupropion for 1 month.  - buPROPion (WELLBUTRIN SR) 200 MG 12 hr tablet; Take 1 tablet (200 mg total) by mouth 2 (two) times daily.  Dispense: 60 tablet; Refill: 0  3. Obesity: BMI 30.02 Ashlee Swanson is currently in the action stage of change. As such, her goal is to continue with weight loss efforts. She has agreed to the Category 2 Plan.   Exercise goals: She will get back into her exercise routine.  Behavioral modification  strategies: better snacking choices.  Ashlee Swanson has agreed to follow-up with our clinic in 5 weeks.   Objective:   Blood pressure 126/71, pulse 84, temperature 98.7 F (37.1 C), height $RemoveBe'5\' 4"'[id number]$  (1.626 m), weight 175 lb (79.4 kg), SpO2 96 %. Body mass index is 30.04 kg/m.  General: Cooperative, alert, well developed, in no acute distress. HEENT: Conjunctivae and lids unremarkable. Cardiovascular: Regular rhythm.  Lungs: Normal work of breathing. Neurologic: No focal deficits.   Lab Results  Component Value Date   CREATININE 1.05 06/11/2020   BUN 20 06/11/2020   NA 140 06/11/2020   K 4.4 06/11/2020   CL 106 06/11/2020   CO2 29 06/11/2020   Lab Results  Component Value Date   ALT 11 06/11/2020   AST 14 06/11/2020   ALKPHOS 58 06/11/2020   BILITOT 0.5 06/11/2020   Lab Results  Component Value Date   HGBA1C 5.0 01/18/2020   HGBA1C 5.2 06/12/2019   HGBA1C 5.2 09/07/2018   Lab Results  Component Value Date   INSULIN 7.0 01/18/2020   INSULIN 6.8 09/07/2018   Lab Results  Component Value Date   TSH 1.49 06/11/2020   Lab Results  Component Value Date   CHOL 178 06/11/2020   HDL 42.40 06/11/2020   LDLCALC 121 (H) 06/11/2020   TRIG 70.0 06/11/2020   CHOLHDL 4 06/11/2020   Lab Results  Component Value Date   WBC 7.4 06/11/2020   HGB 14.2 06/11/2020   HCT  41.8 06/11/2020   MCV 99.9 06/11/2020   PLT 352.0 06/11/2020   No results found for: IRON, TIBC, FERRITIN  Attestation Statements:   Reviewed by clinician on day of visit: allergies, medications, problem list, medical history, surgical history, family history, social history, and previous encounter notes.   Wilhemena Durie, am acting as Location manager for [employer], FNP-C.  I have reviewed the above documentation for accuracy and completeness, and I agree with the above. -  Georgianne Fick, FNP

## 2021-05-12 ENCOUNTER — Other Ambulatory Visit (INDEPENDENT_AMBULATORY_CARE_PROVIDER_SITE_OTHER): Payer: Self-pay | Admitting: Family Medicine

## 2021-05-12 DIAGNOSIS — I10 Essential (primary) hypertension: Secondary | ICD-10-CM

## 2021-06-11 ENCOUNTER — Encounter (INDEPENDENT_AMBULATORY_CARE_PROVIDER_SITE_OTHER): Payer: Self-pay | Admitting: Family Medicine

## 2021-06-11 ENCOUNTER — Other Ambulatory Visit (INDEPENDENT_AMBULATORY_CARE_PROVIDER_SITE_OTHER): Payer: Self-pay | Admitting: Family Medicine

## 2021-06-11 ENCOUNTER — Other Ambulatory Visit: Payer: Self-pay

## 2021-06-11 ENCOUNTER — Ambulatory Visit (INDEPENDENT_AMBULATORY_CARE_PROVIDER_SITE_OTHER): Payer: 59 | Admitting: Family Medicine

## 2021-06-11 VITALS — BP 114/70 | HR 69 | Temp 98.1°F | Ht 64.0 in | Wt 173.0 lb

## 2021-06-11 DIAGNOSIS — E669 Obesity, unspecified: Secondary | ICD-10-CM | POA: Diagnosis not present

## 2021-06-11 DIAGNOSIS — Z6833 Body mass index (BMI) 33.0-33.9, adult: Secondary | ICD-10-CM

## 2021-06-11 DIAGNOSIS — I10 Essential (primary) hypertension: Secondary | ICD-10-CM | POA: Diagnosis not present

## 2021-06-12 ENCOUNTER — Ambulatory Visit (INDEPENDENT_AMBULATORY_CARE_PROVIDER_SITE_OTHER): Payer: 59 | Admitting: Family Medicine

## 2021-06-12 ENCOUNTER — Ambulatory Visit: Payer: 59 | Attending: Internal Medicine

## 2021-06-12 ENCOUNTER — Encounter: Payer: Self-pay | Admitting: Family Medicine

## 2021-06-12 VITALS — BP 122/80 | HR 61 | Temp 98.6°F | Ht 64.0 in | Wt 176.0 lb

## 2021-06-12 DIAGNOSIS — Z0000 Encounter for general adult medical examination without abnormal findings: Secondary | ICD-10-CM

## 2021-06-12 DIAGNOSIS — Z1211 Encounter for screening for malignant neoplasm of colon: Secondary | ICD-10-CM

## 2021-06-12 DIAGNOSIS — Z23 Encounter for immunization: Secondary | ICD-10-CM

## 2021-06-12 LAB — CBC WITH DIFFERENTIAL/PLATELET
Basophils Absolute: 0 10*3/uL (ref 0.0–0.1)
Basophils Relative: 0.7 % (ref 0.0–3.0)
Eosinophils Absolute: 0.1 10*3/uL (ref 0.0–0.7)
Eosinophils Relative: 1.1 % (ref 0.0–5.0)
HCT: 40 % (ref 36.0–46.0)
Hemoglobin: 14 g/dL (ref 12.0–15.0)
Lymphocytes Relative: 39.4 % (ref 12.0–46.0)
Lymphs Abs: 2.3 10*3/uL (ref 0.7–4.0)
MCHC: 35.1 g/dL (ref 30.0–36.0)
MCV: 97.5 fl (ref 78.0–100.0)
Monocytes Absolute: 0.5 10*3/uL (ref 0.1–1.0)
Monocytes Relative: 8.7 % (ref 3.0–12.0)
Neutro Abs: 3 10*3/uL (ref 1.4–7.7)
Neutrophils Relative %: 50.1 % (ref 43.0–77.0)
Platelets: 339 10*3/uL (ref 150.0–400.0)
RBC: 4.1 Mil/uL (ref 3.87–5.11)
RDW: 11.9 % (ref 11.5–15.5)
WBC: 5.9 10*3/uL (ref 4.0–10.5)

## 2021-06-12 LAB — LIPID PANEL
Cholesterol: 199 mg/dL (ref 0–200)
HDL: 49 mg/dL (ref >39.00)
LDL Cholesterol: 138 mg/dL — ABNORMAL HIGH (ref 0–99)
NonHDL: 150.11
Total CHOL/HDL Ratio: 4
Triglycerides: 63 mg/dL (ref 0.0–149.0)
VLDL: 12.6 mg/dL (ref 0.0–40.0)

## 2021-06-12 LAB — COMPREHENSIVE METABOLIC PANEL
ALT: 15 U/L (ref 0–35)
AST: 16 U/L (ref 0–37)
Albumin: 4.4 g/dL (ref 3.5–5.2)
Alkaline Phosphatase: 58 U/L (ref 39–117)
BUN: 19 mg/dL (ref 6–23)
CO2: 29 mEq/L (ref 19–32)
Calcium: 9.8 mg/dL (ref 8.4–10.5)
Chloride: 105 mEq/L (ref 96–112)
Creatinine, Ser: 1.13 mg/dL (ref 0.40–1.20)
GFR: 56.68 mL/min — ABNORMAL LOW (ref >60.00)
Glucose, Bld: 98 mg/dL (ref 70–99)
Potassium: 4.2 mEq/L (ref 3.5–5.1)
Sodium: 142 mEq/L (ref 135–145)
Total Bilirubin: 0.4 mg/dL (ref 0.2–1.2)
Total Protein: 7.1 g/dL (ref 6.0–8.3)

## 2021-06-12 LAB — VITAMIN D 25 HYDROXY (VIT D DEFICIENCY, FRACTURES): VITD: 83.36 ng/mL (ref 30.00–100.00)

## 2021-06-12 LAB — VITAMIN B12: Vitamin B-12: 280 pg/mL (ref 211–911)

## 2021-06-12 LAB — TSH: TSH: 2.19 u[IU]/mL (ref 0.35–4.50)

## 2021-06-12 NOTE — Progress Notes (Signed)
Subjective:   By signing my name below, I, Ashlee Swanson, attest that this documentation has been prepared under the direction and in the presence of Dr. Roma Schanz, DO. 06/12/2021    Patient ID: Ashlee Swanson, female    DOB: 08/29/1971, 50 y.o.   MRN: [id number]  Chief Complaint  Patient presents with   Annual Exam    Colonoscopy: needs to schedule. Zoster: pt does not think so Pap/ mammogram: sees gyn Concerns/questions: wants to know if she has extra wax, Insomnia, wants insulin and Vit D checked.    HPI Patient is in today for a comprehensive physical exam.  She is requesting for her vitamin D levels and fasting random insulin to be tested in her blood screening for her healthy weight and wellness program. She has lost 10 lb's from last year while doing the healthy weight and wellness program. She reports  that her hearing has worsened but she is not seeing a specialist a this time to manage her issue. She also thinks that she is developing tinnitus.  She denies having any fever, ear pain, congestion, sinus pain, sore throat, eye pain, chest pain, palpations, cough, SOB, wheezing, n/v/d, constipation, blood in stool, dysuria, frequency, hematuria, new joint pain, depression, anxiety, new skin changes, or headaches at this time. She has 2 pfizer Covid-19 vaccines a this time. She is interested in getting the 1 Covid-19 booster vaccine at a later time. She is interested in getting the shingles vaccine at a later dates after she gets the Covid-19 booster vaccine.  She has no recent change in her family history. She had a sinus lift and bone fracture reconstruction procedure recently.    Past Medical History:  Diagnosis Date   Arthritis    In the Back   Back pain    Bunion    DDD (degenerative disc disease), lumbar    High cholesterol    Hypertension    IBS (irritable bowel syndrome)    Insomnia    Kidney stone    Lactose intolerance    Snoring     Past  Surgical History:  Procedure Laterality Date   BUNIONECTOMY Left 12/21/1997   modified mcbride bunionectony   SINUS LIFT WITH BONE GRAFT Left 02/07/2020   oral surgeon in [place]    Family History  Problem Relation Age of Onset   Colon cancer Paternal 66    Breast cancer Maternal Aunt    Heart disease Other    Stroke Maternal Grandmother    Hypertension Mother    Hypertension Father    Hypertension Brother    Stroke Paternal Grandfather     Social History   Socioeconomic History   Marital status: Married    Spouse name: Not on file   Number of children: Not on file   Years of education: Not on file   Highest education level: Not on file  Occupational History    Comment: [employer]  Tobacco Use   Smoking status: Former    Packs/day: 0.25    Years: 10.00    Pack years: 2.50    Types: Cigarettes    Quit date: 05/29/2004    Years since quitting: 17.0   Smokeless tobacco: Never  Substance and Sexual Activity   Alcohol use: Yes    Alcohol/week: 0.0 standard drinks    Comment: socially   Drug use: No   Sexual activity: Yes    Partners: Male  Other Topics Concern   Not on file  Social History Narrative   Exercise- 4 days a week   Social Determinants of Radio broadcast assistant Strain: Not on file  Food Insecurity: Not on file  Transportation Needs: Not on file  Physical Activity: Not on file  Stress: Not on file  Social Connections: Not on file  Intimate Partner Violence: Not on file    Outpatient Medications Prior to Visit  Medication Sig Dispense Refill   buPROPion (WELLBUTRIN SR) 200 MG 12 hr tablet Take 1 tablet (200 mg total) by mouth 2 (two) times daily. 60 tablet 0   chlorthalidone (HYGROTON) 25 MG tablet Take 0.5 tablets (12.5 mg total) by mouth daily. 15 tablet 0   cholecalciferol (VITAMIN D3) 25 MCG (1000 UT) tablet Take 1,000 Units by mouth 2 (two) times daily.     Eszopiclone 3 MG TABS Take 1 tablet (3 mg total) by mouth at bedtime  as needed. Take immediately before bedtime 30 tablet 1   Ibuprofen-diphenhydrAMINE Cit (ADVIL PM PO) Take by mouth.     polyethylene glycol (MIRALAX / GLYCOLAX) packet Take 17 g by mouth daily.     No facility-administered medications prior to visit.    Allergies  Allergen Reactions   Minocycline Hives   Codeine Nausea Only    Abdominal pain, shakes   Tetracycline Hives   Health Maintenance  Topic Date Due   COLONOSCOPY (Pts 45-80yrs Insurance coverage will need to be confirmed)  Never done   Zoster Vaccines- Shingrix (1 of 2) Never done   PAP SMEAR-Modifier  11/08/2020   MAMMOGRAM  03/26/2021   INFLUENZA VACCINE  07/21/2021   TETANUS/TDAP  12/11/2029   COVID-19 Vaccine  Completed   Hepatitis C Screening  Completed   HIV Screening  Completed   Pneumococcal Vaccine 7-16 Years old  Aged Out   HPV VACCINES  Aged Out     Review of Systems  Constitutional:  Negative for fever.  HENT:  Positive for hearing loss and tinnitus. Negative for congestion, ear pain, sinus pain and sore throat.   Eyes:  Negative for pain.  Respiratory:  Negative for cough, shortness of breath and wheezing.   Cardiovascular:  Negative for chest pain and palpitations.  Gastrointestinal:  Negative for blood in stool, constipation, diarrhea, nausea and vomiting.  Genitourinary:  Negative for dysuria, frequency and hematuria.  Musculoskeletal:  Negative for joint pain ((-)New joint pain).  Skin:        (-)new skin changes  Neurological:  Negative for headaches.  Psychiatric/Behavioral:  Negative for depression. The patient is not nervous/anxious.       Objective:    Physical Exam Constitutional:      General: She is not in acute distress.    Appearance: Normal appearance. She is not ill-appearing.  HENT:     Head: Normocephalic and atraumatic.     Right Ear: Tympanic membrane, ear canal and external ear normal.     Left Ear: Tympanic membrane, ear canal and external ear normal.  Eyes:      Extraocular Movements: Extraocular movements intact.     Pupils: Pupils are equal, round, and reactive to light.  Cardiovascular:     Rate and Rhythm: Normal rate and regular rhythm.     Pulses: Normal pulses.     Heart sounds: Normal heart sounds. No murmur heard.   No gallop.  Pulmonary:     Effort: Pulmonary effort is normal. No respiratory distress.     Breath sounds: Normal breath sounds. No wheezing, rhonchi or  rales.  Abdominal:     General: Bowel sounds are normal. There is no distension.     Palpations: Abdomen is soft. There is no mass.     Tenderness: There is no abdominal tenderness. There is no guarding or rebound.     Hernia: No hernia is present.  Skin:    General: Skin is warm and dry.  Neurological:     Mental Status: She is alert and oriented to person, place, and time.  Psychiatric:        Behavior: Behavior normal.    BP 122/80 (BP Location: Left Arm, Patient Position: Sitting, Cuff Size: Normal)   Pulse 61   Temp 98.6 F (37 C) (Oral)   Ht 5\' 4"  (1.626 m)   Wt 176 lb (79.8 kg)   SpO2 98%   BMI 30.21 kg/m  Wt Readings from Last 3 Encounters:  06/12/21 176 lb (79.8 kg)  06/11/21 173 lb (78.5 kg)  05/07/21 175 lb (79.4 kg)    Diabetic Foot Exam - Simple   No data filed    Lab Results  Component Value Date   WBC 7.4 06/11/2020   HGB 14.2 06/11/2020   HCT 41.8 06/11/2020   PLT 352.0 06/11/2020   GLUCOSE 94 06/11/2020   CHOL 178 06/11/2020   TRIG 70.0 06/11/2020   HDL 42.40 06/11/2020   LDLCALC 121 (H) 06/11/2020   ALT 11 06/11/2020   AST 14 06/11/2020   NA 140 06/11/2020   K 4.4 06/11/2020   CL 106 06/11/2020   CREATININE 1.05 06/11/2020   BUN 20 06/11/2020   CO2 29 06/11/2020   TSH 1.49 06/11/2020   HGBA1C 5.0 01/18/2020    Lab Results  Component Value Date   TSH 1.49 06/11/2020   Lab Results  Component Value Date   WBC 7.4 06/11/2020   HGB 14.2 06/11/2020   HCT 41.8 06/11/2020   MCV 99.9 06/11/2020   PLT 352.0 06/11/2020    Lab Results  Component Value Date   NA 140 06/11/2020   K 4.4 06/11/2020   CO2 29 06/11/2020   GLUCOSE 94 06/11/2020   BUN 20 06/11/2020   CREATININE 1.05 06/11/2020   BILITOT 0.5 06/11/2020   ALKPHOS 58 06/11/2020   AST 14 06/11/2020   ALT 11 06/11/2020   PROT 7.1 06/11/2020   ALBUMIN 4.5 06/11/2020   CALCIUM 9.6 06/11/2020   GFR 55.56 (L) 06/11/2020   Lab Results  Component Value Date   CHOL 178 06/11/2020   Lab Results  Component Value Date   HDL 42.40 06/11/2020   Lab Results  Component Value Date   LDLCALC 121 (H) 06/11/2020   Lab Results  Component Value Date   TRIG 70.0 06/11/2020   Lab Results  Component Value Date   CHOLHDL 4 06/11/2020   Lab Results  Component Value Date   HGBA1C 5.0 01/18/2020   Mammogram- Last completed 03/26/2020. Results showed 2 benign findings. Repeat in one year. She has a upcomming mammogram and pap smear scheduled this year at her GYN. Pap Smear- Last completed 05/29/2016. Results normal. Repeat in 3 years. Due. Colonoscopy- Not yet completed. Due. She is interested in setting up an appointment.      Assessment & Plan:   Problem List Items Addressed This Visit       Unprioritized   Preventative health care - Primary    ghm utd Check labs  See avs       Relevant Orders   Lipid panel  CBC with Differential/Platelet   Comprehensive metabolic panel   Vitamin Q30   TSH   Insulin, random   VITAMIN D 25 Hydroxy (Vit-D Deficiency, Fractures)   Other Visit Diagnoses     Colon cancer screening       Relevant Orders   Ambulatory referral to Gastroenterology        No orders of the defined types were placed in this encounter.   I, Dr. Roma Schanz, DO, personally preformed the services described in this documentation.  All medical record entries made by the scribe were at my direction and in my presence.  I have reviewed the chart and discharge instructions (if applicable) and agree that the record  reflects my personal performance and is accurate and complete. 06/12/2021   I,Ashlee Swanson,acting as a scribe for [employer], DO.,have documented all relevant documentation on the behalf of Ann Held, DO,as directed by  Ann Held, DO while in the presence of Ann Held, DO.   Ann Held, DO

## 2021-06-12 NOTE — Patient Instructions (Signed)
Preventive Care 68-51 Years Old, Female Preventive care refers to lifestyle choices and visits with your health care provider that can promote health and wellness. This includes: A yearly physical exam. This is also called an annual wellness visit. Regular dental and eye exams. Immunizations. Screening for certain conditions. Healthy lifestyle choices, such as: Eating a healthy diet. Getting regular exercise. Not using drugs or products that contain nicotine and tobacco. Limiting alcohol use. What can I expect for my preventive care visit? Physical exam Your health care provider will check your: Height and weight. These may be used to calculate your BMI (body mass index). BMI is a measurement that tells if you are at a healthy weight. Heart rate and blood pressure. Body temperature. Skin for abnormal spots. Counseling Your health care provider may ask you questions about your: Past medical problems. Family's medical history. Alcohol, tobacco, and drug use. Emotional well-being. Home life and relationship well-being. Sexual activity. Diet, exercise, and sleep habits. Work and work Statistician. Access to firearms. Method of birth control. Menstrual cycle. Pregnancy history. What immunizations do I need?  Vaccines are usually given at various ages, according to a schedule. Your health care provider will recommend vaccines for you based on your age, medicalhistory, and lifestyle or other factors, such as travel or where you work. What tests do I need? Blood tests Lipid and cholesterol levels. These may be checked every 5 years, or more often if you are over 37 years old. Hepatitis C test. Hepatitis B test. Screening Lung cancer screening. You may have this screening every year starting at age 30 if you have a 30-pack-year history of smoking and currently smoke or have quit within the past 15 years. Colorectal cancer screening. All adults should have this screening starting at  age 23 and continuing until age 3. Your health care provider may recommend screening at age 88 if you are at increased risk. You will have tests every 1-10 years, depending on your results and the type of screening test. Diabetes screening. This is done by checking your blood sugar (glucose) after you have not eaten for a while (fasting). You may have this done every 1-3 years. Mammogram. This may be done every 1-2 years. Talk with your health care provider about when you should start having regular mammograms. This may depend on whether you have a family history of breast cancer. BRCA-related cancer screening. This may be done if you have a family history of breast, ovarian, tubal, or peritoneal cancers. Pelvic exam and Pap test. This may be done every 3 years starting at age 79. Starting at age 54, this may be done every 5 years if you have a Pap test in combination with an HPV test. Other tests STD (sexually transmitted disease) testing, if you are at risk. Bone density scan. This is done to screen for osteoporosis. You may have this scan if you are at high risk for osteoporosis. Talk with your health care provider about your test results, treatment options,and if necessary, the need for more tests. Follow these instructions at home: Eating and drinking  Eat a diet that includes fresh fruits and vegetables, whole grains, lean protein, and low-fat dairy products. Take vitamin and mineral supplements as recommended by your health care provider. Do not drink alcohol if: Your health care provider tells you not to drink. You are pregnant, may be pregnant, or are planning to become pregnant. If you drink alcohol: Limit how much you have to 0-1 drink a day. Be aware  of how much alcohol is in your drink. In the [place] one drink equals one 12 oz bottle of beer (355 mL), one 5 oz glass of wine (148 mL), or one 1 oz glass of hard liquor (44 mL).  Lifestyle Take daily care of your teeth and  gums. Brush your teeth every morning and night with fluoride toothpaste. Floss one time each day. Stay active. Exercise for at least 30 minutes 5 or more days each week. Do not use any products that contain nicotine or tobacco, such as cigarettes, e-cigarettes, and chewing tobacco. If you need help quitting, ask your health care provider. Do not use drugs. If you are sexually active, practice safe sex. Use a condom or other form of protection to prevent STIs (sexually transmitted infections). If you do not wish to become pregnant, use a form of birth control. If you plan to become pregnant, see your health care provider for a prepregnancy visit. If told by your health care provider, take low-dose aspirin daily starting at age 29. Find healthy ways to cope with stress, such as: Meditation, yoga, or listening to music. Journaling. Talking to a trusted person. Spending time with friends and family. Safety Always wear your seat belt while driving or riding in a vehicle. Do not drive: If you have been drinking alcohol. Do not ride with someone who has been drinking. When you are tired or distracted. While texting. Wear a helmet and other protective equipment during sports activities. If you have firearms in your house, make sure you follow all gun safety procedures. What's next? Visit your health care provider once a year for an annual wellness visit. Ask your health care provider how often you should have your eyes and teeth checked. Stay up to date on all vaccines. This information is not intended to replace advice given to you by your health care provider. Make sure you discuss any questions you have with your healthcare provider. Document Revised: 09/10/2020 Document Reviewed: 08/18/2018 Elsevier Patient Education  2022 [employer].

## 2021-06-12 NOTE — Assessment & Plan Note (Signed)
ghm utd Check labs  See avs  

## 2021-06-12 NOTE — Progress Notes (Signed)
   Covid-19 Vaccination Clinic  Name:  Ashlee Swanson    MRN: [id number] DOB: 01-25-1971  06/12/2021  [name] was observed post Covid-19 immunization for 15 minutes without incident. She was provided with Vaccine Information Sheet and instruction to access the V-Safe system.   Ms. Valent was instructed to call 911 with any severe reactions post vaccine: Difficulty breathing  Swelling of face and throat  A fast heartbeat  A bad rash all over body  Dizziness and weakness   Immunizations Administered     Name Date Dose VIS Date Route   PFIZER Comrnaty(Gray TOP) Covid-19 Vaccine 06/12/2021  9:29 AM 0.3 mL 11/28/2020 Intramuscular   Manufacturer: [place]   Lot: [id number]   [place]: [phone]

## 2021-06-13 ENCOUNTER — Other Ambulatory Visit (HOSPITAL_BASED_OUTPATIENT_CLINIC_OR_DEPARTMENT_OTHER): Payer: Self-pay

## 2021-06-13 LAB — INSULIN, RANDOM: Insulin: 7.1 u[IU]/mL

## 2021-06-13 MED ORDER — COVID-19 MRNA VAC-TRIS(PFIZER) 30 MCG/0.3ML IM SUSP
INTRAMUSCULAR | 0 refills | Status: DC
  Filled 2021-06-13: qty 0.3, 1d supply, fill #0

## 2021-06-17 NOTE — Progress Notes (Signed)
Chief Complaint:   OBESITY Ashlee Swanson is here to discuss her progress with her obesity treatment plan along with follow-up of her obesity related diagnoses. Ashlee Swanson is on the Category 2 Plan and keeping a food journal and adhering to recommended goals of 1300 calories and 85 g protein and states she is following her eating plan approximately 75% of the time. Ashlee Swanson states she is doing Yoga 60 minutes 2 times per week.  Today's visit was #: 24 Starting weight: 194 lbs Starting date: 09/07/2018 Today's weight: 173 lbs Today's date: 06/11/2021 Total lbs lost to date: 21 Total lbs lost since last in-office visit: 2  Interim History: Ashlee Swanson is getting close to her goal weight of 170 lbs. She is now in the overweight category vs obese and is very happy with this. Her weight loss has been steady recently. She is doing Yoga 2 times a week. She is going kayaking/camping this weekend.  Subjective:   1. Essential hypertension Ashlee Swanson is well controlled with chlorthalidone 12.5 mg QD.   BP Readings from Last 3 Encounters:  06/12/21 122/80  06/11/21 114/70  05/07/21 126/71   Assessment/Plan:   1. Essential hypertension Continue chlorthalidone.   2. Overweight: BMI 29.68  Ashlee Swanson is currently in the action stage of change. As such, her goal is to continue with weight loss efforts. She has agreed to the Category 2 Plan and keeping a food journal and adhering to recommended goals of 1300 calories and 85 g protein.   Exercise goals:  As is  Behavioral modification strategies: travel eating strategies.  Ashlee Swanson has agreed to follow-up with our clinic in 5 weeks.  Objective:   Blood pressure 114/70, pulse 69, temperature 98.1 F (36.7 C), height 5\' 4"  (1.626 m), weight 173 lb (78.5 kg), SpO2 99 %. Body mass index is 29.7 kg/m.  General: Cooperative, alert, well developed, in no acute distress. HEENT: Conjunctivae and lids unremarkable. Cardiovascular: Regular rhythm.  Lungs: Normal work  of breathing. Neurologic: No focal deficits.   Lab Results  Component Value Date   CREATININE 1.13 06/12/2021   BUN 19 06/12/2021   NA 142 06/12/2021   K 4.2 06/12/2021   CL 105 06/12/2021   CO2 29 06/12/2021   Lab Results  Component Value Date   ALT 15 06/12/2021   AST 16 06/12/2021   ALKPHOS 58 06/12/2021   BILITOT 0.4 06/12/2021   Lab Results  Component Value Date   HGBA1C 5.0 01/18/2020   HGBA1C 5.2 06/12/2019   HGBA1C 5.2 09/07/2018   Lab Results  Component Value Date   INSULIN 7.0 01/18/2020   INSULIN 6.8 09/07/2018   Lab Results  Component Value Date   TSH 2.19 06/12/2021   Lab Results  Component Value Date   CHOL 199 06/12/2021   HDL 49.00 06/12/2021   LDLCALC 138 (H) 06/12/2021   TRIG 63.0 06/12/2021   CHOLHDL 4 06/12/2021   Lab Results  Component Value Date   WBC 5.9 06/12/2021   HGB 14.0 06/12/2021   HCT 40.0 06/12/2021   MCV 97.5 06/12/2021   PLT 339.0 06/12/2021   No results found for: IRON, TIBC, FERRITIN  Attestation Statements:   Reviewed by clinician on day of visit: allergies, medications, problem list, medical history, surgical history, family history, social history, and previous encounter notes.  Coral Ceo, CMA, am acting as Location manager for [employer], [place].  I have reviewed the above documentation for accuracy and completeness, and I agree with the above. -  Georgianne Fick, FNP

## 2021-06-20 ENCOUNTER — Encounter: Payer: Self-pay | Admitting: Gastroenterology

## 2021-06-24 ENCOUNTER — Other Ambulatory Visit (INDEPENDENT_AMBULATORY_CARE_PROVIDER_SITE_OTHER): Payer: Self-pay | Admitting: Family Medicine

## 2021-06-24 DIAGNOSIS — I10 Essential (primary) hypertension: Secondary | ICD-10-CM

## 2021-06-24 NOTE — Telephone Encounter (Signed)
Pt last seen by Dawn Whitmire, FNP.  

## 2021-06-25 MED ORDER — CHLORTHALIDONE 25 MG PO TABS: 12.5000 mg | ORAL_TABLET | Freq: Every day | ORAL | 0 refills | Status: DC

## 2021-06-25 NOTE — Telephone Encounter (Signed)
Lov:  06/11/21   Rtc: 5 weeks Pend: 07/16/21 Last refill 05/07/21  Last two b/p readings:    06/11/21:  114/70 05/07/21:  126/71  Refill request

## 2021-07-16 ENCOUNTER — Ambulatory Visit (INDEPENDENT_AMBULATORY_CARE_PROVIDER_SITE_OTHER): Payer: 59 | Admitting: Family Medicine

## 2021-07-16 ENCOUNTER — Other Ambulatory Visit: Payer: Self-pay

## 2021-07-16 ENCOUNTER — Encounter (INDEPENDENT_AMBULATORY_CARE_PROVIDER_SITE_OTHER): Payer: Self-pay | Admitting: Family Medicine

## 2021-07-16 VITALS — BP 123/76 | HR 74 | Temp 98.5°F | Ht 64.0 in | Wt 173.0 lb

## 2021-07-16 DIAGNOSIS — E669 Obesity, unspecified: Secondary | ICD-10-CM | POA: Diagnosis not present

## 2021-07-16 DIAGNOSIS — Z6833 Body mass index (BMI) 33.0-33.9, adult: Secondary | ICD-10-CM

## 2021-07-16 DIAGNOSIS — F3289 Other specified depressive episodes: Secondary | ICD-10-CM

## 2021-07-16 DIAGNOSIS — I10 Essential (primary) hypertension: Secondary | ICD-10-CM | POA: Diagnosis not present

## 2021-07-22 ENCOUNTER — Other Ambulatory Visit (INDEPENDENT_AMBULATORY_CARE_PROVIDER_SITE_OTHER): Payer: Self-pay | Admitting: Family Medicine

## 2021-07-22 ENCOUNTER — Encounter (INDEPENDENT_AMBULATORY_CARE_PROVIDER_SITE_OTHER): Payer: Self-pay | Admitting: Family Medicine

## 2021-07-22 DIAGNOSIS — I10 Essential (primary) hypertension: Secondary | ICD-10-CM

## 2021-07-22 NOTE — Telephone Encounter (Signed)
Last seen by Dawn  

## 2021-07-22 NOTE — Progress Notes (Signed)
Chief Complaint:   OBESITY Ashlee Swanson is here to discuss her progress with her obesity treatment plan along with follow-up of her obesity related diagnoses. Ashlee Swanson is on the Category 2 Plan or keeping a food journal and adhering to recommended goals of 1300 calories and 85 grams of protein daily and states she is following her eating plan approximately 70% of the time. Ashlee Swanson states she is doing 0 minutes 0 times per week.  Today's visit was #: 102 Starting weight: 194 lbs Starting date: 09/07/2018 Today's weight: 173 lbs Today's date: 07/16/2021 Total lbs lost to date: 21 Total lbs lost since last in-office visit: 0  Interim History: Ashlee Swanson just got back from a one week beach trip. She was off the plan somewhat but she is now back.  She journaled while on vacation. She is now back on the plan and journaling. She maintained her weight today. She is going camping and tubing this weekend.  Subjective:   1. Essential hypertension Ashlee Swanson's blood pressure is well controlled. She is on chlorthalidone.  BP Readings from Last 3 Encounters:  07/16/21 123/76  06/12/21 122/80  06/11/21 114/70   Lab Results  Component Value Date   CREATININE 1.13 06/12/2021   CREATININE 1.05 06/11/2020   CREATININE 1.04 (H) 01/18/2020   Assessment/Plan:   1. Essential hypertension Ashlee Swanson will continue chlorthalidone, and will continue working on healthy weight loss and exercise to improve blood pressure control.   2. Overweight: BMI 29.68 Ashlee Swanson is currently in the action stage of change. As such, her goal is to continue with weight loss efforts. She has agreed to keeping a food journal and adhering to recommended goals of 1300 calories and 85 grams of protein daily.   Exercise goals: She will get back to exercise.  Behavioral modification strategies: travel eating strategies.  Ashlee Swanson has agreed to follow-up with our clinic in 3 to 4 weeks.  Objective:   Blood pressure 123/76, pulse 74,  temperature 98.5 F (36.9 C), height '5\' 4"'$  (1.626 m), weight 173 lb (78.5 kg), SpO2 96 %. Body mass index is 29.7 kg/m.  General: Cooperative, alert, well developed, in no acute distress. HEENT: Conjunctivae and lids unremarkable. Cardiovascular: Regular rhythm.  Lungs: Normal work of breathing. Neurologic: No focal deficits.   Lab Results  Component Value Date   CREATININE 1.13 06/12/2021   BUN 19 06/12/2021   NA 142 06/12/2021   K 4.2 06/12/2021   CL 105 06/12/2021   CO2 29 06/12/2021   Lab Results  Component Value Date   ALT 15 06/12/2021   AST 16 06/12/2021   ALKPHOS 58 06/12/2021   BILITOT 0.4 06/12/2021   Lab Results  Component Value Date   HGBA1C 5.0 01/18/2020   HGBA1C 5.2 06/12/2019   HGBA1C 5.2 09/07/2018   Lab Results  Component Value Date   INSULIN 7.0 01/18/2020   INSULIN 6.8 09/07/2018   Lab Results  Component Value Date   TSH 2.19 06/12/2021   Lab Results  Component Value Date   CHOL 199 06/12/2021   HDL 49.00 06/12/2021   LDLCALC 138 (H) 06/12/2021   TRIG 63.0 06/12/2021   CHOLHDL 4 06/12/2021   Lab Results  Component Value Date   VD25OH 83.36 06/12/2021   VD25OH 69.50 06/11/2020   VD25OH 33.8 01/18/2020   Lab Results  Component Value Date   WBC 5.9 06/12/2021   HGB 14.0 06/12/2021   HCT 40.0 06/12/2021   MCV 97.5 06/12/2021   PLT 339.0 06/12/2021  No results found for: IRON, TIBC, FERRITIN  Attestation Statements:   Reviewed by clinician on day of visit: allergies, medications, problem list, medical history, surgical history, family history, social history, and previous encounter notes.   Wilhemena Durie, am acting as Location manager for [employer], FNP-C.  I have reviewed the above documentation for accuracy and completeness, and I agree with the above. -  Georgianne Fick, FNP

## 2021-08-07 ENCOUNTER — Other Ambulatory Visit (INDEPENDENT_AMBULATORY_CARE_PROVIDER_SITE_OTHER): Payer: Self-pay | Admitting: Family Medicine

## 2021-08-07 DIAGNOSIS — I10 Essential (primary) hypertension: Secondary | ICD-10-CM

## 2021-08-07 DIAGNOSIS — F3289 Other specified depressive episodes: Secondary | ICD-10-CM

## 2021-08-07 NOTE — Telephone Encounter (Signed)
Last OV with Dawn 

## 2021-08-13 ENCOUNTER — Encounter (INDEPENDENT_AMBULATORY_CARE_PROVIDER_SITE_OTHER): Payer: Self-pay | Admitting: Family Medicine

## 2021-08-13 ENCOUNTER — Ambulatory Visit (INDEPENDENT_AMBULATORY_CARE_PROVIDER_SITE_OTHER): Payer: 59 | Admitting: Family Medicine

## 2021-08-13 ENCOUNTER — Other Ambulatory Visit: Payer: Self-pay

## 2021-08-13 VITALS — BP 125/74 | HR 80 | Temp 99.0°F | Ht 64.0 in | Wt 172.0 lb

## 2021-08-13 DIAGNOSIS — Z6833 Body mass index (BMI) 33.0-33.9, adult: Secondary | ICD-10-CM

## 2021-08-13 DIAGNOSIS — E66811 Obesity, class 1: Secondary | ICD-10-CM

## 2021-08-13 DIAGNOSIS — F3289 Other specified depressive episodes: Secondary | ICD-10-CM

## 2021-08-13 DIAGNOSIS — I10 Essential (primary) hypertension: Secondary | ICD-10-CM | POA: Diagnosis not present

## 2021-08-13 DIAGNOSIS — E669 Obesity, unspecified: Secondary | ICD-10-CM | POA: Diagnosis not present

## 2021-08-13 MED ORDER — CHLORTHALIDONE 25 MG PO TABS: 12.5000 mg | ORAL_TABLET | Freq: Every day | ORAL | 0 refills | Status: DC

## 2021-08-13 NOTE — Progress Notes (Deleted)
Chief Complaint:   OBESITY Ashlee Swanson is here to discuss her progress with her obesity treatment plan along with follow-up of her obesity related diagnoses. Ashlee Swanson is on {MWMwtlossportion/plan2:23431} and states she is following her eating plan approximately ***% of the time. Ashlee Swanson states she is *** *** minutes *** times per week.  Today's visit was #: *** Starting weight: *** Starting date: *** Today's weight: *** Today's date: 08/13/2021 Total lbs lost to date: *** Total lbs lost since last in-office visit: ***  Interim History: ***  Subjective:   1. Essential hypertension ***  2. Other depression with emotional eating  ***  3. Overweight: BMI 29.51 ***   Assessment/Plan:   1. Essential hypertension *** - chlorthalidone (HYGROTON) 25 MG tablet; Take 0.5 tablets (12.5 mg total) by mouth daily.  Dispense: 15 tablet; Refill: 0  2. Other depression with emotional eating  ***  3. Overweight: BMI 29.51 ***  Ashlee Swanson {CHL AMB IS/IS NOT:[id number]} currently in the action stage of change. As such, her goal is to {MWMwtloss#1:210800005}. She has agreed to {MWMwtlossportion/plan2:23431}.   Exercise goals: {MWM EXERCISE RECS:23473}  Behavioral modification strategies: {MWMwtlossdietstrategies3:23432}.  Ashlee Swanson has agreed to follow-up with our clinic in {NUMBER 1-10:22536} weeks. She was informed of the importance of frequent follow-up visits to maximize her success with intensive lifestyle modifications for her multiple health conditions.   ***delete paragraph if no labs orderedSerena was informed we would discuss her lab results at her next visit unless there is a critical issue that needs to be addressed sooner. Ashlee Swanson agreed to keep her next visit at the agreed upon time to discuss these results.  Objective:   Blood pressure 125/74, pulse 80, temperature 99 F (37.2 C), temperature source Oral, height '5\' 4"'$  (1.626 m), weight 172 lb (78 kg), SpO2 97 %. Body mass index is  29.52 kg/m.  General: Cooperative, alert, well developed, in no acute distress. HEENT: Conjunctivae and lids unremarkable. Cardiovascular: Regular rhythm.  Lungs: Normal work of breathing. Neurologic: No focal deficits.   Lab Results  Component Value Date   CREATININE 1.13 06/12/2021   BUN 19 06/12/2021   NA 142 06/12/2021   K 4.2 06/12/2021   CL 105 06/12/2021   CO2 29 06/12/2021   Lab Results  Component Value Date   ALT 15 06/12/2021   AST 16 06/12/2021   ALKPHOS 58 06/12/2021   BILITOT 0.4 06/12/2021   Lab Results  Component Value Date   HGBA1C 5.0 01/18/2020   HGBA1C 5.2 06/12/2019   HGBA1C 5.2 09/07/2018   Lab Results  Component Value Date   INSULIN 7.0 01/18/2020   INSULIN 6.8 09/07/2018   Lab Results  Component Value Date   TSH 2.19 06/12/2021   Lab Results  Component Value Date   CHOL 199 06/12/2021   HDL 49.00 06/12/2021   LDLCALC 138 (H) 06/12/2021   TRIG 63.0 06/12/2021   CHOLHDL 4 06/12/2021   Lab Results  Component Value Date   VD25OH 83.36 06/12/2021   VD25OH 69.50 06/11/2020   VD25OH 33.8 01/18/2020   Lab Results  Component Value Date   WBC 5.9 06/12/2021   HGB 14.0 06/12/2021   HCT 40.0 06/12/2021   MCV 97.5 06/12/2021   PLT 339.0 06/12/2021   No results found for: IRON, TIBC, FERRITIN  Obesity Behavioral Intervention:   Approximately 15 minutes were spent on the discussion below.  ASK: We discussed the diagnosis of obesity with Ashlee Swanson today and Ashlee Swanson agreed to give [place] permission to discuss  obesity behavioral modification therapy today.  ASSESS: Ashlee Swanson has the diagnosis of obesity and her BMI today is ***. Ashlee Swanson {ACTION; IS/IS [id number] in the action stage of change.   ADVISE: Ashlee Swanson was educated on the multiple health risks of obesity as well as the benefit of weight loss to improve her health. She was advised of the need for long term treatment and the importance of lifestyle modifications to improve her current health  and to decrease her risk of future health problems.  AGREE: Multiple dietary modification options and treatment options were discussed and Ashlee Swanson agreed to follow the recommendations documented in the above note.  ARRANGE: Ashlee Swanson was educated on the importance of frequent visits to treat obesity as outlined per CMS and USPSTF guidelines and agreed to schedule her next follow up appointment today.  Attestation Statements:   Reviewed by clinician on day of visit: allergies, medications, problem list, medical history, surgical history, family history, social history, and previous encounter notes.  ***(delete if time-based billing not used)Time spent on visit including pre-visit chart review and post-visit care and charting was *** minutes.   Swanson, ***, am acting as transcriptionist for ***.  Swanson have reviewed the above documentation for accuracy and completeness, and Swanson agree with the above. -  ***

## 2021-08-13 NOTE — Progress Notes (Signed)
Chief Complaint:   OBESITY Ashlee Swanson is here to discuss her progress with her obesity treatment plan along with follow-up of her obesity related diagnoses. Ashlee Swanson is on the Category 2 Plan and keeping a food journal and adhering to recommended goals of 1300 calories and 85 grams of protein and states she is following her eating plan approximately 70% of the time. Ashlee Swanson states she is kayaking and walking.  Today's visit was #: 46 Starting weight: 194 lbs Starting date: 09/07/2018 Today's weight: 172 lbs Today's date: 08/13/2021 Total lbs lost to date: 22 lbs Total lbs lost since last in-office visit: 1 lb  Interim History: Ashlee Swanson is journaling until dinner and calories are usually 1300-1500 calories per day. She tends to not journal her dinner any any eating after dinner. Her protein is averaging 65-70. She does not have issues with hunger during the day but struggles with snacking at night.  Subjective:   1. Essential hypertension Ashlee Swanson's hypertension is well controlled on 12.5 mg of chlorthalidone.   BP Readings from Last 3 Encounters:  08/13/21 125/74  07/16/21 123/76  06/12/21 122/80    2. Other depression with emotional eating  Ashlee Swanson is taking 200 mg in the afternoon only. She does not know if its helping yet with night snacking. She has a history of nephrolithiasis so Topamax is not an option.  Assessment/Plan:   1. Essential hypertension We will refill chlorthalidone 0.5 tab (12.5 mg) daily for 15 days with no refills.  - chlorthalidone (HYGROTON) 25 MG tablet; Take 0.5 tablets (12.5 mg total) by mouth daily.  Dispense: 15 tablet; Refill: 0  2. Other depression with emotional eating  Behavior modification techniques were discussed today to help Ashlee Swanson deal with her emotional/non-hunger eating behaviors.  Orders and follow up as documented in patient record.    3. Overweight: BMI 29.51 Ashlee Swanson is currently in the action stage of change. As such, her goal is to  continue with weight loss efforts. She has agreed to the Category 2 Plan or keeping a food journal and adhering to recommended goals of 1300 calories and 85 grams of protein daily.  Exercise goals: No exercise has been prescribed at this time.  Behavioral modification strategies: increasing lean protein intake and better snacking choices.  Ashlee Swanson has agreed to follow-up with our clinic in 3-4 weeks.  Objective:   Blood pressure 125/74, pulse 80, temperature 99 F (37.2 C), temperature source Oral, height '5\' 4"'$  (1.626 m), weight 172 lb (78 kg), SpO2 97 %. Body mass index is 29.52 kg/m.  General: Cooperative, alert, well developed, in no acute distress. HEENT: Conjunctivae and lids unremarkable. Cardiovascular: Regular rhythm.  Lungs: Normal work of breathing. Neurologic: No focal deficits.   Lab Results  Component Value Date   CREATININE 1.13 06/12/2021   BUN 19 06/12/2021   NA 142 06/12/2021   K 4.2 06/12/2021   CL 105 06/12/2021   CO2 29 06/12/2021   Lab Results  Component Value Date   ALT 15 06/12/2021   AST 16 06/12/2021   ALKPHOS 58 06/12/2021   BILITOT 0.4 06/12/2021   Lab Results  Component Value Date   HGBA1C 5.0 01/18/2020   HGBA1C 5.2 06/12/2019   HGBA1C 5.2 09/07/2018   Lab Results  Component Value Date   INSULIN 7.0 01/18/2020   INSULIN 6.8 09/07/2018   Lab Results  Component Value Date   TSH 2.19 06/12/2021   Lab Results  Component Value Date   CHOL 199 06/12/2021  HDL 49.00 06/12/2021   LDLCALC 138 (H) 06/12/2021   TRIG 63.0 06/12/2021   CHOLHDL 4 06/12/2021   Lab Results  Component Value Date   VD25OH 83.36 06/12/2021   VD25OH 69.50 06/11/2020   VD25OH 33.8 01/18/2020   Lab Results  Component Value Date   WBC 5.9 06/12/2021   HGB 14.0 06/12/2021   HCT 40.0 06/12/2021   MCV 97.5 06/12/2021   PLT 339.0 06/12/2021   No results found for: IRON, TIBC, FERRITIN  Attestation Statements:   Reviewed by clinician on day of visit:  allergies, medications, problem list, medical history, surgical history, family history, social history, and previous encounter notes.  I, Lizbeth Bark, RMA, am acting as Location manager for [employer], [place].   I have reviewed the above documentation for accuracy and completeness, and I agree with the above. -  Georgianne Fick, FNP

## 2021-09-08 ENCOUNTER — Ambulatory Visit (INDEPENDENT_AMBULATORY_CARE_PROVIDER_SITE_OTHER): Payer: 59 | Admitting: Family Medicine

## 2021-09-16 ENCOUNTER — Other Ambulatory Visit: Payer: Self-pay

## 2021-09-16 ENCOUNTER — Ambulatory Visit (INDEPENDENT_AMBULATORY_CARE_PROVIDER_SITE_OTHER): Payer: 59 | Admitting: Family Medicine

## 2021-09-16 ENCOUNTER — Encounter (INDEPENDENT_AMBULATORY_CARE_PROVIDER_SITE_OTHER): Payer: Self-pay | Admitting: Family Medicine

## 2021-09-16 VITALS — BP 115/75 | HR 66 | Temp 97.8°F | Ht 64.0 in | Wt 170.0 lb

## 2021-09-16 DIAGNOSIS — I10 Essential (primary) hypertension: Secondary | ICD-10-CM

## 2021-09-16 DIAGNOSIS — F3289 Other specified depressive episodes: Secondary | ICD-10-CM | POA: Diagnosis not present

## 2021-09-16 DIAGNOSIS — E669 Obesity, unspecified: Secondary | ICD-10-CM | POA: Diagnosis not present

## 2021-09-16 DIAGNOSIS — Z6833 Body mass index (BMI) 33.0-33.9, adult: Secondary | ICD-10-CM | POA: Diagnosis not present

## 2021-09-16 MED ORDER — CHLORTHALIDONE 25 MG PO TABS: 12.5000 mg | ORAL_TABLET | Freq: Every day | ORAL | 0 refills | Status: DC

## 2021-09-16 MED ORDER — BUPROPION HCL ER (SR) 200 MG PO TB12: 200.0000 mg | ORAL_TABLET | Freq: Two times a day (BID) | ORAL | 0 refills | Status: DC

## 2021-09-16 NOTE — Progress Notes (Signed)
Chief Complaint:   OBESITY Annistyn is here to discuss her progress with her obesity treatment plan along with follow-up of her obesity related diagnoses. Annais is on the Category 2 Plan or keeping a food journal and adhering to recommended goals of 1300 calories and 85 protein and states she is following her eating plan approximately 70% of the time. Jonai states she is walking and doing yoga for 30 minutes 4-5 times per week.  Today's visit was #: 70 Starting weight: 194 lbs Starting date: 09/07/2018 Today's weight: 170 lbs Today's date: 09/16/2021 Total lbs lost to date: 24 lbs Total lbs lost since last in-office visit: 2 lbs  Interim History: Taviana went to [place] last week and still lost 2 lbs. She says she did not over do alcohol. She also did a lot of walking.  She has met her goal weight of 170 lbs today but thinks she would like to lose 5 more pounds.  Subjective:   1. Essential hypertension Kadience's hypertension is well controlled on chlorthalidone.   BP Readings from Last 3 Encounters:  09/16/21 115/75  08/13/21 125/74  07/16/21 123/76     2. Other depression with emotional eating  Shaquita's mood is stable. She is on bupropion 200 mg BID.  Assessment/Plan:   1. Essential hypertension We will refill Chlorthalidone 0.5 mg for 15 days with no refills. We will watch for signs of hypotension as she continues her lifestyle modifications.  - chlorthalidone (HYGROTON) 25 MG tablet; Take 0.5 tablets (12.5 mg total) by mouth daily.  Dispense: 15 tablet; Refill: 0  2. Other depression with emotional eating   We will refill bupropion 200 mg for 1 month with no refills. Orders and follow up as documented in patient record.    - buPROPion (WELLBUTRIN SR) 200 MG 12 hr tablet; Take 1 tablet (200 mg total) by mouth 2 (two) times daily.  Dispense: 60 tablet; Refill: 0  3. Overweight: BMI 29.17 Oral is currently in the action stage of change. As such, her goal is to  continue with weight loss efforts. She has agreed to the Category 2 Plan or keeping a food journal and adhering to recommended goals of 1300 calories and 85 grams of protein daily.   Exercise goals:  As is.  Behavioral modification strategies: planning for success.  Ralph has agreed to follow-up with our clinic in 3-4 weeks.  Objective:   Blood pressure 115/75, pulse 66, temperature 97.8 F (36.6 C), height $RemoveBe'5\' 4"'MiKwEbLpg$  (1.626 m), weight 170 lb (77.1 kg), SpO2 97 %. Body mass index is 29.18 kg/m.  General: Cooperative, alert, well developed, in no acute distress. HEENT: Conjunctivae and lids unremarkable. Cardiovascular: Regular rhythm.  Lungs: Normal work of breathing. Neurologic: No focal deficits.   Lab Results  Component Value Date   CREATININE 1.13 06/12/2021   BUN 19 06/12/2021   NA 142 06/12/2021   K 4.2 06/12/2021   CL 105 06/12/2021   CO2 29 06/12/2021   Lab Results  Component Value Date   ALT 15 06/12/2021   AST 16 06/12/2021   ALKPHOS 58 06/12/2021   BILITOT 0.4 06/12/2021   Lab Results  Component Value Date   HGBA1C 5.0 01/18/2020   HGBA1C 5.2 06/12/2019   HGBA1C 5.2 09/07/2018   Lab Results  Component Value Date   INSULIN 7.0 01/18/2020   INSULIN 6.8 09/07/2018   Lab Results  Component Value Date   TSH 2.19 06/12/2021   Lab Results  Component Value Date  CHOL 199 06/12/2021   HDL 49.00 06/12/2021   LDLCALC 138 (H) 06/12/2021   TRIG 63.0 06/12/2021   CHOLHDL 4 06/12/2021   Lab Results  Component Value Date   VD25OH 83.36 06/12/2021   VD25OH 69.50 06/11/2020   VD25OH 33.8 01/18/2020   Lab Results  Component Value Date   WBC 5.9 06/12/2021   HGB 14.0 06/12/2021   HCT 40.0 06/12/2021   MCV 97.5 06/12/2021   PLT 339.0 06/12/2021   No results found for: IRON, TIBC, FERRITIN  Attestation Statements:   Reviewed by clinician on day of visit: allergies, medications, problem list, medical history, surgical history, family history, social  history, and previous encounter notes.   I, Lizbeth Bark, RMA, am acting as Location manager for [employer], [place].   I have reviewed the above documentation for accuracy and completeness, and I agree with the above. -  Georgianne Fick, FNP

## 2021-09-22 ENCOUNTER — Encounter: Payer: Self-pay | Admitting: Internal Medicine

## 2021-09-22 ENCOUNTER — Other Ambulatory Visit: Payer: Self-pay

## 2021-09-22 ENCOUNTER — Ambulatory Visit (AMBULATORY_SURGERY_CENTER): Payer: Self-pay

## 2021-09-22 VITALS — Ht 64.0 in | Wt 173.0 lb

## 2021-09-22 DIAGNOSIS — Z1211 Encounter for screening for malignant neoplasm of colon: Secondary | ICD-10-CM

## 2021-09-22 MED ORDER — PEG-KCL-NACL-NASULF-NA ASC-C 100 G PO SOLR: 1.0000 | Freq: Once | ORAL | 0 refills | Status: AC

## 2021-09-22 NOTE — Progress Notes (Signed)
Patient is here in-person for PV. Patient denies any allergies to eggs or soy. Patient denies any problems with anesthesia/sedation. Patient is not on any oxygen at home. Patient is not taking any diet/weight loss medications or blood thinners. Patient is aware of our care-partner policy and Covid-19 safety protocol.   EMMI education assigned to the patient for the procedure, sent to MyChart.   Patient is COVID-19 vaccinated.  

## 2021-10-05 ENCOUNTER — Encounter: Payer: Self-pay | Admitting: Certified Registered Nurse Anesthetist

## 2021-10-06 ENCOUNTER — Ambulatory Visit (AMBULATORY_SURGERY_CENTER): Payer: 59 | Admitting: Gastroenterology

## 2021-10-06 ENCOUNTER — Encounter: Payer: Self-pay | Admitting: Gastroenterology

## 2021-10-06 VITALS — BP 139/84 | HR 67 | Temp 98.4°F | Resp 17 | Ht 64.0 in | Wt 173.0 lb

## 2021-10-06 DIAGNOSIS — Z8371 Family history of colonic polyps: Secondary | ICD-10-CM | POA: Diagnosis not present

## 2021-10-06 DIAGNOSIS — Z1211 Encounter for screening for malignant neoplasm of colon: Secondary | ICD-10-CM | POA: Diagnosis present

## 2021-10-06 MED ORDER — SODIUM CHLORIDE 0.9 % IV SOLN: 500.0000 mL | Freq: Once | INTRAVENOUS | Status: DC

## 2021-10-06 NOTE — Progress Notes (Signed)
Report given to PACU, vss 

## 2021-10-06 NOTE — Op Note (Signed)
[name] Procedure Date: 10/06/2021 10:29 AM MRN: [id number] Endoscopist: Thornton Park MD, MD Age: 50 Referring MD:  Date of Birth: 06-29-1971 Gender: Female Account #: [id number] Procedure:                Colonoscopy Indications:              Screening for colorectal malignant neoplasm, This                            is the patient's first colonoscopy                           Father with colon polyps                           Paternal aunt with colon cancer at age 38 Medicines:                Monitored Anesthesia Care Procedure:                Pre-Anesthesia Assessment:                           - Prior to the procedure, a History and Physical                            was performed, and patient medications and                            allergies were reviewed. The patient's tolerance of                            previous anesthesia was also reviewed. The risks                            and benefits of the procedure and the sedation                            options and risks were discussed with the patient.                            All questions were answered, and informed consent                            was obtained. Prior Anticoagulants: The patient has                            taken no previous anticoagulant or antiplatelet                            agents. ASA Grade Assessment: II - A patient with                            mild systemic disease. After reviewing the risks  and benefits, the patient was deemed in                            satisfactory condition to undergo the procedure.                           After obtaining informed consent, the colonoscope                            was passed under direct vision. Throughout the                            procedure, the patient's blood pressure, pulse, and                            oxygen saturations were monitored continuously.  The                            CF [id number] #[id number] was introduced through the anus                            and advanced to the 3 cm into the ileum. A second                            forward view of the right colon was performed. The                            patient tolerated the procedure well. The quality                            of the bowel preparation was good. The terminal                            ileum, ileocecal valve, appendiceal orifice, and                            rectum were photographed. The colonoscopy was                            performed with moderate difficulty due to                            significant looping. Successful completion of the                            procedure was aided by applying abdominal pressure. Scope In: 10:39:28 AM Scope Out: 10:54:25 AM Scope Withdrawal Time: 0 hours 7 minutes 18 seconds  Total Procedure Duration: 0 hours 14 minutes 57 seconds  Findings:                 The perianal and digital rectal examinations were                            normal.  The entire examined colon appeared normal on direct                            and retroflexion views. Complications:            No immediate complications. Estimated Blood Loss:     Estimated blood loss: none. Impression:               - The entire examined colon is normal on direct and                            retroflexion views.                           - No specimens collected. Recommendation:           - Patient has a contact number available for                            emergencies. The signs and symptoms of potential                            delayed complications were discussed with the                            patient. Return to normal activities tomorrow.                            Written discharge instructions were provided to the                            patient.                           - Resume previous diet.                            - Continue present medications.                           - Repeat colonoscopy in 5 years for surveillance                            given the family history.                           - Emerging evidence supports eating a diet of                            fruits, vegetables, grains, calcium, and yogurt                            while reducing red meat and alcohol may reduce the                            risk of colon cancer.                           -  Thank you for allowing me to be involved in your                            colon cancer prevention. Thornton Park MD, MD 10/06/2021 11:00:09 AM This report has been signed electronically.

## 2021-10-06 NOTE — Progress Notes (Signed)
C.W. vital signs. 

## 2021-10-06 NOTE — Progress Notes (Signed)
Referring Provider: Carollee Herter, Alferd Apa, * Primary Care Physician:  Carollee Herter, Alferd Apa, DO  Reason for Procedure:  Colon cancer screening   IMPRESSION:  Need for colon cancer screening Father with colon polyps Paternal aunt with colon cancer at age 51 Appropriate candidate for monitored anesthesia care  PLAN: Colonoscopy in the [facility] today   HPI: Ashlee Swanson is a 50 y.o. female presents for screening colonoscopy.  No prior colonoscopy or colon cancer screening.  No baseline GI symptoms.   Father with colon polyps. Paternal aunt with colon cancer at age advanced age. No other known family history of colon cancer or polyps. No family history of uterine/endometrial cancer, pancreatic cancer or gastric/stomach cancer.   Past Medical History:  Diagnosis Date   Arthritis    In the Back   Back pain    Bunion    DDD (degenerative disc disease), lumbar    High cholesterol    Hypertension    IBS (irritable bowel syndrome)    Insomnia    Kidney stone    Lactose intolerance    Periodontal disease    with gum graft   Snoring     Past Surgical History:  Procedure Laterality Date   BUNIONECTOMY Left 12/21/1997   modified mcbride bunionectony   SINUS LIFT WITH BONE GRAFT Left 02/07/2020   oral surgeon in [place]    Current Outpatient Medications  Medication Sig Dispense Refill   b complex vitamins capsule Take 1 capsule by mouth daily.     buPROPion (WELLBUTRIN SR) 200 MG 12 hr tablet Take 1 tablet (200 mg total) by mouth 2 (two) times daily. 60 tablet 0   chlorthalidone (HYGROTON) 25 MG tablet Take 0.5 tablets (12.5 mg total) by mouth daily. 15 tablet 0   cholecalciferol (VITAMIN D3) 25 MCG (1000 UT) tablet Take 1,000 Units by mouth 2 (two) times daily.     COVID-19 mRNA Vac-TriS, Pfizer, SUSP injection Inject into the muscle. 0.3 mL 0   diphenhydrAMINE (BENADRYL) 25 mg capsule Take 25 mg by mouth at bedtime as needed.     Eszopiclone 3 MG TABS Take 1  tablet (3 mg total) by mouth at bedtime as needed. Take immediately before bedtime 30 tablet 1   Ibuprofen-diphenhydrAMINE Cit (ADVIL PM PO) Take by mouth.     Melatonin 5 MG CAPS Take by mouth at bedtime as needed.     No current facility-administered medications for this visit.    Allergies as of 10/06/2021 - Review Complete 10/05/2021  Allergen Reaction Noted   Minocycline Hives 05/29/2016   Codeine Nausea Only 12/31/2008   Tetracycline Hives 12/31/2008    Family History  Problem Relation Age of Onset   Hypertension Mother    Colon polyps Father    Hypertension Father    Hypertension Brother    Breast cancer Maternal Aunt    Colon cancer Paternal Aunt        later in life, died at age 67   Stroke Maternal Grandmother    Stroke Paternal Grandfather    Heart disease Other    Esophageal cancer Neg Hx    Rectal cancer Neg Hx    Stomach cancer Neg Hx      Physical Exam: General:   Alert,  well-nourished, pleasant and cooperative in NAD Head:  Normocephalic and atraumatic. Eyes:  Sclera clear, no icterus.   Conjunctiva pink. Mouth:  No deformity or lesions.   Neck:  Supple; no masses or thyromegaly. Lungs:  Clear throughout to auscultation.   No wheezes. Heart:  Regular rate and rhythm; no murmurs. Abdomen:  Soft, non-tender, nondistended, normal bowel sounds, no rebound or guarding.  Msk:  Symmetrical. No boney deformities LAD: No inguinal or umbilical LAD Extremities:  No clubbing or edema. Neurologic:  Alert and  oriented x4;  grossly nonfocal Skin:  No obvious rash or bruise. Psych:  Alert and cooperative. Normal mood and affect.    Ashlee Veloso L. Tarri Glenn, MD, MPH 10/06/2021, 10:16 AM

## 2021-10-06 NOTE — Patient Instructions (Signed)
Thank you for allowing [place] to care for you today! Resume previous diet and medications today. Resume normal daily activities tomorrow. Recommend next screening colonoscopy in 5 years based on family history.      YOU HAD AN ENDOSCOPIC PROCEDURE TODAY AT [facility]:   Refer to the procedure report that was given to you for any specific questions about what was found during the examination.  If the procedure report does not answer your questions, please call your gastroenterologist to clarify.  If you requested that your care partner not be given the details of your procedure findings, then the procedure report has been included in a sealed envelope for you to review at your convenience later.  YOU SHOULD EXPECT: Some feelings of bloating in the abdomen. Passage of more gas than usual.  Walking can help get rid of the air that was put into your GI tract during the procedure and reduce the bloating. If you had a lower endoscopy (such as a colonoscopy or flexible sigmoidoscopy) you may notice spotting of blood in your stool or on the toilet paper. If you underwent a bowel prep for your procedure, you may not have a normal bowel movement for a few days.  Please Note:  You might notice some irritation and congestion in your nose or some drainage.  This is from the oxygen used during your procedure.  There is no need for concern and it should clear up in a day or so.  SYMPTOMS TO REPORT IMMEDIATELY:  Following lower endoscopy (colonoscopy or flexible sigmoidoscopy):  Excessive amounts of blood in the stool  Significant tenderness or worsening of abdominal pains  Swelling of the abdomen that is new, acute  Fever of 100F or higher    For urgent or emergent issues, a gastroenterologist can be reached at any hour by calling [phone]. Do not use MyChart messaging for urgent concerns.    DIET:  We do recommend a small meal at first, but then you may proceed to your regular  diet.  Drink plenty of fluids but you should avoid alcoholic beverages for 24 hours.  ACTIVITY:  You should plan to take it easy for the rest of today and you should NOT DRIVE or use heavy machinery until tomorrow (because of the sedation medicines used during the test).    FOLLOW UP: Our staff will call the number listed on your records 48-72 hours following your procedure to check on you and address any questions or concerns that you may have regarding the information given to you following your procedure. If we do not reach you, we will leave a message.  We will attempt to reach you two times.  During this call, we will ask if you have developed any symptoms of COVID 19. If you develop any symptoms (ie: fever, flu-like symptoms, shortness of breath, cough etc.) before then, please call [phone].  If you test positive for Covid 19 in the 2 weeks post procedure, please call and report this information to [place].    If any biopsies were taken you will be contacted by phone or by letter within the next 1-3 weeks.  Please call us at [phone] if you have not heard about the biopsies in 3 weeks.    SIGNATURES/CONFIDENTIALITY: You and/or your care partner have signed paperwork which will be entered into your electronic medical record.  These signatures attest to the fact that that the information above on your After Visit Summary has been reviewed  and is understood.  Full responsibility of the confidentiality of this discharge information lies with you and/or your care-partner.

## 2021-10-06 NOTE — Progress Notes (Signed)
Pt's states no medical or surgical changes since previsit or office visit. 

## 2021-10-08 ENCOUNTER — Telehealth: Payer: Self-pay | Admitting: *Deleted

## 2021-10-08 NOTE — Telephone Encounter (Signed)
  Follow up Call-  Call back number 10/06/2021  Post procedure Call Back phone  # [phone]  Permission to leave phone message Yes  Some recent data might be hidden     Patient questions:  Do you have a fever, pain , or abdominal swelling? No. Pain Score  0 *  Have you tolerated food without any problems? Yes.    Have you been able to return to your normal activities? Yes.    Do you have any questions about your discharge instructions: Diet   No. Medications  No. Follow up visit  No.  Do you have questions or concerns about your Care? No.  Actions: * If pain score is 4 or above: No action needed, pain <4.  Have you developed a fever since your procedure? no  2.   Have you had an respiratory symptoms (SOB or cough) since your procedure? no  3.   Have you tested positive for COVID 19 since your procedure no  4.   Have you had any family members/close contacts diagnosed with the COVID 19 since your procedure?  no   If yes to any of these questions please route to Joylene John, RN and Joella Prince, RN

## 2021-10-14 ENCOUNTER — Other Ambulatory Visit: Payer: Self-pay

## 2021-10-14 ENCOUNTER — Ambulatory Visit (INDEPENDENT_AMBULATORY_CARE_PROVIDER_SITE_OTHER): Payer: 59 | Admitting: Family Medicine

## 2021-10-14 ENCOUNTER — Encounter (INDEPENDENT_AMBULATORY_CARE_PROVIDER_SITE_OTHER): Payer: Self-pay | Admitting: Family Medicine

## 2021-10-14 VITALS — BP 113/61 | HR 79 | Temp 98.6°F | Ht 64.0 in | Wt 168.0 lb

## 2021-10-14 DIAGNOSIS — Z6833 Body mass index (BMI) 33.0-33.9, adult: Secondary | ICD-10-CM | POA: Diagnosis not present

## 2021-10-14 DIAGNOSIS — I10 Essential (primary) hypertension: Secondary | ICD-10-CM | POA: Diagnosis not present

## 2021-10-14 DIAGNOSIS — E669 Obesity, unspecified: Secondary | ICD-10-CM | POA: Diagnosis not present

## 2021-10-14 DIAGNOSIS — F3289 Other specified depressive episodes: Secondary | ICD-10-CM | POA: Diagnosis not present

## 2021-10-14 MED ORDER — BUPROPION HCL ER (SR) 200 MG PO TB12: 200.0000 mg | ORAL_TABLET | Freq: Two times a day (BID) | ORAL | 0 refills | Status: DC

## 2021-10-14 MED ORDER — CHLORTHALIDONE 25 MG PO TABS: 12.5000 mg | ORAL_TABLET | Freq: Every day | ORAL | 0 refills | Status: DC

## 2021-10-14 NOTE — Progress Notes (Signed)
Chief Complaint:   OBESITY Ashlee Swanson is here to discuss her progress with her obesity treatment plan along with follow-up of her obesity related diagnoses. Ashlee Swanson is on the Category 2 Plan and keeping a food journal and adhering to recommended goals of 1300 calories and 85 grams of protein and states she is following her eating plan approximately 70% of the time. Ashlee Swanson states she is not exercising regularly at this time.  Today's visit was #: 85 Starting weight: 194 lbs Starting date: 09/07/2018 Today's weight: 168 lbs Today's date: 10/14/2021 Total lbs lost to date: 26 lbs Total lbs lost since last in-office visit: 2 lbs  Interim History: Ashlee Swanson says she is having healthier dinners since her son is eating better.  She feels she is in a good pattern of eating and does not even want the things she used to eat.   Her goal weight is 165 pounds (28 BMI).  Subjective:   1. Essential hypertension BP continues to be well-controlled on chlorthalidone.   BP Readings from Last 3 Encounters:  10/14/21 113/61  10/06/21 139/84  09/16/21 115/75     2. Other depression with emotional eating  She has some night cravings, but tries not to keep foods in house that tempt her.  She has substituted some low calorie options.  Assessment/Plan:   1. Essential hypertension Refill chlorthalidone 0.5 tablet (12.5 mg).  - Refill chlorthalidone (HYGROTON) 25 MG tablet; Take 0.5 tablets (12.5 mg total) by mouth daily.  Dispense: 15 tablet; Refill: 0  2. Other depression with emotional eating  Refill bupropion 200 mg BID, as per below.  - Refill buPROPion (WELLBUTRIN SR) 200 MG 12 hr tablet; Take 1 tablet (200 mg total) by mouth 2 (two) times daily.  Dispense: 60 tablet; Refill: 0  3. Overweight: BMI 28.82  Ashlee Swanson is currently in the action stage of change. As such, her goal is to continue with weight loss efforts. She has agreed to the Category 2 Plan and keeping a food journal and adhering to  recommended goals of 1300 calories and 85 grams of protein.   We discussed maintenance in depth.  Exercise goals:   Add resistance training 2-3 days per week.  She will start back to yoga.  Behavioral modification strategies: planning for success.  Ashlee Swanson has agreed to follow-up with our clinic in 3-4 weeks.  Objective:   Blood pressure 113/61, pulse 79, temperature 98.6 F (37 C), height 5\' 4"  (1.626 m), weight 168 lb (76.2 kg), SpO2 98 %. Body mass index is 28.84 kg/m.  General: Cooperative, alert, well developed, in no acute distress. HEENT: Conjunctivae and lids unremarkable. Cardiovascular: Regular rhythm.  Lungs: Normal work of breathing. Neurologic: No focal deficits.   Lab Results  Component Value Date   CREATININE 1.13 06/12/2021   BUN 19 06/12/2021   NA 142 06/12/2021   K 4.2 06/12/2021   CL 105 06/12/2021   CO2 29 06/12/2021   Lab Results  Component Value Date   ALT 15 06/12/2021   AST 16 06/12/2021   ALKPHOS 58 06/12/2021   BILITOT 0.4 06/12/2021   Lab Results  Component Value Date   HGBA1C 5.0 01/18/2020   HGBA1C 5.2 06/12/2019   HGBA1C 5.2 09/07/2018   Lab Results  Component Value Date   INSULIN 7.0 01/18/2020   INSULIN 6.8 09/07/2018   Lab Results  Component Value Date   TSH 2.19 06/12/2021   Lab Results  Component Value Date   CHOL 199 06/12/2021  HDL 49.00 06/12/2021   LDLCALC 138 (H) 06/12/2021   TRIG 63.0 06/12/2021   CHOLHDL 4 06/12/2021   Lab Results  Component Value Date   VD25OH 83.36 06/12/2021   VD25OH 69.50 06/11/2020   VD25OH 33.8 01/18/2020   Lab Results  Component Value Date   WBC 5.9 06/12/2021   HGB 14.0 06/12/2021   HCT 40.0 06/12/2021   MCV 97.5 06/12/2021   PLT 339.0 06/12/2021   Attestation Statements:   Reviewed by clinician on day of visit: allergies, medications, problem list, medical history, surgical history, family history, social history, and previous encounter notes.  I, Water quality scientist, CMA, am  acting as Location manager for [employer], [place].  I have reviewed the above documentation for accuracy and completeness, and I agree with the above. -  Georgianne Fick, FNP

## 2021-11-18 ENCOUNTER — Encounter (INDEPENDENT_AMBULATORY_CARE_PROVIDER_SITE_OTHER): Payer: Self-pay | Admitting: Family Medicine

## 2021-11-18 ENCOUNTER — Ambulatory Visit (INDEPENDENT_AMBULATORY_CARE_PROVIDER_SITE_OTHER): Payer: 59 | Admitting: Family Medicine

## 2021-11-18 ENCOUNTER — Other Ambulatory Visit: Payer: Self-pay

## 2021-11-18 VITALS — BP 119/80 | HR 71 | Temp 97.9°F | Ht 64.0 in | Wt 169.0 lb

## 2021-11-18 DIAGNOSIS — Z6833 Body mass index (BMI) 33.0-33.9, adult: Secondary | ICD-10-CM

## 2021-11-18 DIAGNOSIS — F3289 Other specified depressive episodes: Secondary | ICD-10-CM | POA: Diagnosis not present

## 2021-11-18 DIAGNOSIS — E669 Obesity, unspecified: Secondary | ICD-10-CM | POA: Diagnosis not present

## 2021-11-18 DIAGNOSIS — I10 Essential (primary) hypertension: Secondary | ICD-10-CM | POA: Diagnosis not present

## 2021-11-18 MED ORDER — CHLORTHALIDONE 25 MG PO TABS: 12.5000 mg | ORAL_TABLET | Freq: Every day | ORAL | 0 refills | Status: DC

## 2021-11-18 MED ORDER — BUPROPION HCL ER (SR) 200 MG PO TB12: 200.0000 mg | ORAL_TABLET | Freq: Two times a day (BID) | ORAL | 0 refills | Status: DC

## 2021-11-18 NOTE — Progress Notes (Addendum)
Chief Complaint:   OBESITY Ashlee Swanson is here to discuss her progress with her obesity treatment plan along with follow-up of her obesity related diagnoses. Ashlee Swanson is on the Category 2 Plan and keeping a food journal and adhering to recommended goals of 1300 calories and 85 grams of protein and states she is following her eating plan approximately 60% of the time. Ashlee Swanson states she is yoga for 60 minutes 1 times per week.  Today's visit was #: 100 Starting weight: 194 lbs Starting date: 09/07/2018 Today's weight: 169 lbs Today's date:11/18/2021 Total lbs lost to date: 25 lbs Total lbs lost since last in-office visit: +1  Interim History: Ashlee Swanson indulged in sweets and leftover from Thanksgiving over the weekend. She is only up 1 lb. She is trying to do more exercise. Her husband has stopped smoking and is snacking much more which makes it more difficult for Ashlee Swanson to avoid snacking.   Subjective:   1. Essential hypertension Ashlee Swanson's blood pressure is well controlled. She is on Chlorthalidone currently.  BP Readings from Last 3 Encounters:  11/18/21 119/80  10/14/21 113/61  10/06/21 139/84     2. Other depression with emotional eating  Ashlee Swanson's mood is stable. Her cravings are fairly well controlled.  Assessment/Plan:   1. Essential hypertension  We will refill chlorthalidone 25 mg she will take 1/2 tablet with no refills. We will watch for signs of hypotension as she continues her lifestyle modifications.  - chlorthalidone (HYGROTON) 25 MG tablet; Take 0.5 tablets (12.5 mg total) by mouth daily.  Dispense: 15 tablet; Refill: 0  2. Other depression with emotional eating  Behavior modification techniques were discussed today to help Ashlee Swanson deal with her emotional/non-hunger eating behaviors.  We will refill bupropion 200 mg twice daily.    - buPROPion (WELLBUTRIN SR) 200 MG 12 hr tablet; Take 1 tablet (200 mg total) by mouth 2 (two) times daily.  Dispense: 60 tablet; Refill:  0  3. Overweight: BMI 28.99 Ashlee Swanson is currently in the action stage of change. As such, her goal is to continue with weight loss efforts. She has agreed to the Category 2 Plan and keeping a food journal and adhering to recommended goals of 1300 calories and 85 grams of protein daily.   Ashlee Swanson will put her husband's snacks in a separate bin and/or location other than the pantry.  Exercise goals: Increase frequency of exercise.  Behavioral modification strategies: dealing with family or coworker sabotage.  Ashlee Swanson has agreed to follow-up with our clinic in 3-5 weeks. Objective:   Blood pressure 119/80, pulse 71, temperature 97.9 F (36.6 C), height 5\' 4"  (1.626 m), weight 169 lb (76.7 kg), SpO2 97 %. Body mass index is 29.01 kg/m.  General: Cooperative, alert, well developed, in no acute distress. HEENT: Conjunctivae and lids unremarkable. Cardiovascular: Regular rhythm.  Lungs: Normal work of breathing. Neurologic: No focal deficits.   Lab Results  Component Value Date   CREATININE 1.13 06/12/2021   BUN 19 06/12/2021   NA 142 06/12/2021   K 4.2 06/12/2021   CL 105 06/12/2021   CO2 29 06/12/2021   Lab Results  Component Value Date   ALT 15 06/12/2021   AST 16 06/12/2021   ALKPHOS 58 06/12/2021   BILITOT 0.4 06/12/2021   Lab Results  Component Value Date   HGBA1C 5.0 01/18/2020   HGBA1C 5.2 06/12/2019   HGBA1C 5.2 09/07/2018   Lab Results  Component Value Date   INSULIN 7.0 01/18/2020   INSULIN 6.8  09/07/2018   Lab Results  Component Value Date   TSH 2.19 06/12/2021   Lab Results  Component Value Date   CHOL 199 06/12/2021   HDL 49.00 06/12/2021   LDLCALC 138 (H) 06/12/2021   TRIG 63.0 06/12/2021   CHOLHDL 4 06/12/2021   Lab Results  Component Value Date   VD25OH 83.36 06/12/2021   VD25OH 69.50 06/11/2020   VD25OH 33.8 01/18/2020   Lab Results  Component Value Date   WBC 5.9 06/12/2021   HGB 14.0 06/12/2021   HCT 40.0 06/12/2021   MCV 97.5  06/12/2021   PLT 339.0 06/12/2021   No results found for: IRON, TIBC, FERRITIN  Attestation Statements:   Reviewed by clinician on day of visit: allergies, medications, problem list, medical history, surgical history, family history, social history, and previous encounter notes.  I, Lizbeth Bark, RMA, am acting as Location manager for [employer], [place].   I have reviewed the above documentation for accuracy and completeness, and I agree with the above. -  Georgianne Fick, FNP

## 2021-12-23 ENCOUNTER — Other Ambulatory Visit: Payer: Self-pay

## 2021-12-23 ENCOUNTER — Ambulatory Visit (INDEPENDENT_AMBULATORY_CARE_PROVIDER_SITE_OTHER): Payer: 59 | Admitting: Family Medicine

## 2021-12-23 ENCOUNTER — Encounter (INDEPENDENT_AMBULATORY_CARE_PROVIDER_SITE_OTHER): Payer: Self-pay | Admitting: Family Medicine

## 2021-12-23 VITALS — BP 121/80 | HR 79 | Temp 98.7°F | Ht 64.0 in | Wt 172.0 lb

## 2021-12-23 DIAGNOSIS — E669 Obesity, unspecified: Secondary | ICD-10-CM | POA: Diagnosis not present

## 2021-12-23 DIAGNOSIS — I10 Essential (primary) hypertension: Secondary | ICD-10-CM

## 2021-12-23 DIAGNOSIS — Z6833 Body mass index (BMI) 33.0-33.9, adult: Secondary | ICD-10-CM

## 2021-12-23 DIAGNOSIS — F3289 Other specified depressive episodes: Secondary | ICD-10-CM

## 2021-12-23 MED ORDER — BUPROPION HCL ER (SR) 200 MG PO TB12: 200.0000 mg | ORAL_TABLET | Freq: Two times a day (BID) | ORAL | 0 refills | Status: DC

## 2021-12-23 MED ORDER — CHLORTHALIDONE 25 MG PO TABS: 12.5000 mg | ORAL_TABLET | Freq: Every day | ORAL | 0 refills | Status: DC

## 2021-12-23 NOTE — Progress Notes (Signed)
Chief Complaint:   OBESITY Ashlee Swanson is here to discuss her progress with her obesity treatment plan along with follow-up of her obesity related diagnoses. Ashlee Swanson is on the Category 2 Plan and keeping a food journal and adhering to recommended goals of 1300 calories and 85 grams of protein and states she is following her eating plan approximately 60% of the time. Ashlee Swanson states she is doing 0 minutes 0 times per week.  Today's visit was #: 48 Starting weight: 194 lbs Starting date: 09/07/2018 Today's weight: 172 lbs Today's date: 12/23/2021 Total lbs lost to date: 22 lbs Total lbs lost since last in-office visit: +3  Interim History: The holidays were a challenge for Ashlee Swanson due to food brought to her by family. She did not journal on the days she indulged. She has not exercised over the holidays.  Subjective:   1. Essential hypertension Ashlee Swanson's blood pressure is well controlled. She is on chlorthalidone 25 mg (1/2 tablet).  BP Readings from Last 3 Encounters:  12/23/21 121/80  11/18/21 119/80  10/14/21 113/61    2. Other depression with emotional eating  Ashlee Swanson stated she has been tempted by food over the holidays. Her cravings are not well controlled.  Assessment/Plan:   1. Essential hypertension We will refill chlorthalidone.   - chlorthalidone (HYGROTON) 25 MG tablet; Take 0.5 tablets (12.5 mg total) by mouth daily.  Dispense: 15 tablet; Refill: 0  2. Other depression with emotional eating  Behavior modification techniques were discussed today to help Ashlee Swanson deal with her emotional/non-hunger eating behaviors.  We will refill bupropion 200 mg twice daily.  Orders and follow up as documented in patient record.  - buPROPion (WELLBUTRIN SR) 200 MG 12 hr tablet; Take 1 tablet (200 mg total) by mouth 2 (two) times daily.  Dispense: 60 tablet; Refill: 0  3. Overweight: BMI 29.51 Ashlee Swanson is currently in the action stage of change. As such, her goal is to continue with weight  loss efforts. She has agreed to keeping a food journal and adhering to recommended goals of 1300 calories and 85 grams of protein daily.   Exercise goals:  Ashlee Swanson will start back exercising.  Behavioral modification strategies: decreasing simple carbohydrates.  Ashlee Swanson has agreed to follow-up with our clinic in 3-4 weeks. Objective:   Blood pressure 121/80, pulse 79, temperature 98.7 F (37.1 C), height 5\' 4"  (1.626 m), weight 172 lb (78 kg), SpO2 98 %. Body mass index is 29.52 kg/m.  General: Cooperative, alert, well developed, in no acute distress. HEENT: Conjunctivae and lids unremarkable. Cardiovascular: Regular rhythm.  Lungs: Normal work of breathing. Neurologic: No focal deficits.   Lab Results  Component Value Date   CREATININE 1.13 06/12/2021   BUN 19 06/12/2021   NA 142 06/12/2021   K 4.2 06/12/2021   CL 105 06/12/2021   CO2 29 06/12/2021   Lab Results  Component Value Date   ALT 15 06/12/2021   AST 16 06/12/2021   ALKPHOS 58 06/12/2021   BILITOT 0.4 06/12/2021   Lab Results  Component Value Date   HGBA1C 5.0 01/18/2020   HGBA1C 5.2 06/12/2019   HGBA1C 5.2 09/07/2018   Lab Results  Component Value Date   INSULIN 7.0 01/18/2020   INSULIN 6.8 09/07/2018   Lab Results  Component Value Date   TSH 2.19 06/12/2021   Lab Results  Component Value Date   CHOL 199 06/12/2021   HDL 49.00 06/12/2021   LDLCALC 138 (H) 06/12/2021   TRIG 63.0 06/12/2021  CHOLHDL 4 06/12/2021   Lab Results  Component Value Date   VD25OH 83.36 06/12/2021   VD25OH 69.50 06/11/2020   VD25OH 33.8 01/18/2020   Lab Results  Component Value Date   WBC 5.9 06/12/2021   HGB 14.0 06/12/2021   HCT 40.0 06/12/2021   MCV 97.5 06/12/2021   PLT 339.0 06/12/2021   No results found for: IRON, TIBC, FERRITIN  Attestation Statements:   Reviewed by clinician on day of visit: allergies, medications, problem list, medical history, surgical history, family history, social history, and  previous encounter notes.  I, Lizbeth Bark, RMA, am acting as Location manager for [employer], [place].  I have reviewed the above documentation for accuracy and completeness, and I agree with the above. -  Georgianne Fick, FNP

## 2021-12-30 LAB — HM MAMMOGRAPHY

## 2022-01-07 ENCOUNTER — Encounter: Payer: Self-pay | Admitting: Family Medicine

## 2022-01-07 ENCOUNTER — Telehealth (INDEPENDENT_AMBULATORY_CARE_PROVIDER_SITE_OTHER): Payer: 59 | Admitting: Family Medicine

## 2022-01-07 ENCOUNTER — Telehealth: Payer: Self-pay | Admitting: Family Medicine

## 2022-01-07 DIAGNOSIS — U071 COVID-19: Secondary | ICD-10-CM

## 2022-01-07 MED ORDER — MOLNUPIRAVIR EUA 200MG CAPSULE: 4.0000 | ORAL_CAPSULE | Freq: Two times a day (BID) | ORAL | 0 refills | Status: AC

## 2022-01-07 MED ORDER — MOLNUPIRAVIR EUA 200MG CAPSULE: 4.0000 | ORAL_CAPSULE | Freq: Two times a day (BID) | ORAL | 0 refills | Status: DC

## 2022-01-07 NOTE — Telephone Encounter (Signed)
Spoke with patient and she stated she did not get a call from [facility] but [facility] can [facility] to [facility] [address].  [facility] to [facility].

## 2022-01-07 NOTE — Telephone Encounter (Signed)
[facility] called stating they can not dispense the molnupiravir EUA (LAGEVRIO) 200 mg CAPS capsule  medication prescribed by Caleen Jobs. Representative states either [facility] or [facility] can. He stated they tried to contact patient to make aware but weren't able to. Please advice.

## 2022-01-07 NOTE — Progress Notes (Signed)
Virtual Video Visit via MyChart Note  I connected with  Ashlee Swanson on 01/07/22 at  9:40 AM EST by the video enabled telemedicine application for MyChart, and verified that I am speaking with the correct person using two identifiers.   I introduced myself as a Designer, jewellery with the practice. We discussed the limitations of evaluation and management by telemedicine and the availability of in person appointments. The patient expressed understanding and agreed to proceed.  Participating parties in this visit include: The patient and the nurse practitioner listed.  The patient is: At home I am: In the office - [facility] [place]:    CC:  Chief Complaint  Patient presents with   Covid Positive    Pt states sxs started on 01/05/2022 and states tested positive on 01/18. Pt states having cough, fever, chills, and congestion.     HPI: Ashlee Swanson is a 51 y.o. year old female presenting today via [place] today for COVID+.  Monday night patient started running a fever of 101 and having chills. By yesterday symptoms worsened and included productive cough, dizziness, body aches, fatigue, headache. She denies any sinus pressure, ear pain, chest pain, shortness of breath, wheezing, nausea, vomiting, diarrhea, nasal congestion/rhinorrhea. So far she has only been taking multivitamins and increased fluids. She does have a history of HTN and obesity and would like antiviral therapy.      Past medical history, Surgical history, Family history not pertinant except as noted below, Social history, Allergies, and medications have been entered into the medical record, reviewed, and corrections made.   Review of Systems:  All review of systems negative except what is listed in the HPI   Objective:    General:  Speaking clearly in complete sentences. Absent shortness of breath noted.   Alert and oriented x3.   Normal judgment.  Absent  acute distress.   Impression and Recommendations:    1. COVID-19 - molnupiravir EUA (LAGEVRIO) 200 mg CAPS capsule; Take 4 capsules (800 mg total) by mouth 2 (two) times daily for 5 days.  Dispense: 40 capsule; Refill: 0  Starting molnupiravir - patient education provided on AVS. No acute distress. Continue supportive measures including rest, hydration, humidifier use, steam showers, warm compresses to sinuses, warm liquids with lemon and honey, and over-the-counter cough, cold, and analgesics as needed.  Patient aware of signs/symptoms requiring further/urgent evaluation.     Follow-up if symptoms worsen or fail to improve.    I discussed the assessment and treatment plan with the patient. The patient was provided an opportunity to ask questions and all were answered. The patient agreed with the plan and demonstrated an understanding of the instructions.   The patient was advised to call back or seek an in-person evaluation if the symptoms worsen or if the condition fails to improve as anticipated.  I spent 20 minutes dedicated to the care of this patient on the date of this encounter to include pre-visit chart review of prior notes and results, face-to-face time with the patient, and post-visit ordering of testing as indicated.   Terrilyn Saver, NP

## 2022-01-07 NOTE — Addendum Note (Signed)
Addended by: Kem Boroughs D on: 01/07/2022 12:50 PM   Modules accepted: Orders

## 2022-01-07 NOTE — Patient Instructions (Signed)
Molnupiravir patient fact sheet: http://www.craig-choi.com/  Over the counter medications that may be helpful for symptoms:  Guaifenesin 1200 mg extended release tabs twice daily, with plenty of water For cough and congestion Brand name: Mucinex   Pseudoephedrine 30 mg, one or two tabs every 4 to 6 hours For sinus congestion Brand name: Sudafed You must get this from the pharmacy counter.  Oxymetazoline nasal spray each morning, one spray in each nostril, for NO MORE THAN 3 days  For nasal and sinus congestion Brand name: Afrin Saline nasal spray or Saline Nasal Irrigation 3-5 times a day For nasal and sinus congestion Brand names: Ocean or AYR Fluticasone nasal spray, one spray in each nostril, each morning (after oxymetazoline and saline, if used) For nasal and sinus congestion Brand name: Flonase Warm salt water gargles  For sore throat Every few hours as needed Alternate ibuprofen 400-600 mg and acetaminophen 1000 mg every 4-6 hours For fever, body aches, headache Brand names: Motrin or Advil and Tylenol Dextromethorphan 12-hour cough version 30 mg every 12 hours  For cough Brand name: Delsym Stop all other cold medications for now (Nyquil, Dayquil, Tylenol Cold, Theraflu, etc) and other non-prescription cough/cold preparations. Many of these have the same ingredients listed above and could cause an overdose of medication.   Herbal treatments that have been shown to be helpful in some patients include: Vitamin C 1000mg  per day Vitamin D 4000iU per day Zinc 100mg  per day Quercetin 25-500mg  twice a day Melatonin 5-10mg  at bedtime  General Instructions Allow your body to rest Drink PLENTY of fluids Isolate yourself from everyone, even family, until test results have returned  If your COVID-19 test is positive Then you ARE INFECTED and you can pass the virus to others You must quarantine from others for a minimum of  5 days since symptoms started  AND You are fever free for 24 hours WITHOUT any medication to reduce fever AND Your symptoms are improving Wear mask for the next 5 days If not improved by day 5, quarantine for the full 10 days. Do not go to the store or other public areas Do not go around household members who are not known to be infected with COVID-19 If you MUST leave your area of quarantine (example: go to a bathroom you share with others in your home), you must Wear a mask Wash your hands thoroughly Wipe down any surfaces you touch Do not share food, drinks, towels, or other items with other persons Dispose of your own tissues, food containers, etc  Once you have recovered, please continue good preventive care measures, including:  wearing a mask when in public wash your hands frequently avoid touching your face/nose/eyes cover coughs/sneezes with the inside of your elbow stay out of crowds keep a 6 foot distance from others  If you develop severe shortness of breath, uncontrolled fevers, coughing up blood, confusion, chest pain, or signs of dehydration (such as significantly decreased urine amounts or dizziness with standing) please go to the ER.

## 2022-01-15 ENCOUNTER — Encounter: Payer: Self-pay | Admitting: Family Medicine

## 2022-01-19 ENCOUNTER — Other Ambulatory Visit (INDEPENDENT_AMBULATORY_CARE_PROVIDER_SITE_OTHER): Payer: Self-pay | Admitting: Family Medicine

## 2022-01-19 DIAGNOSIS — F3289 Other specified depressive episodes: Secondary | ICD-10-CM

## 2022-01-19 DIAGNOSIS — I10 Essential (primary) hypertension: Secondary | ICD-10-CM

## 2022-01-19 NOTE — Telephone Encounter (Signed)
Ashlee Swanson 

## 2022-01-20 ENCOUNTER — Other Ambulatory Visit: Payer: Self-pay

## 2022-01-20 ENCOUNTER — Encounter (INDEPENDENT_AMBULATORY_CARE_PROVIDER_SITE_OTHER): Payer: Self-pay | Admitting: Family Medicine

## 2022-01-20 ENCOUNTER — Ambulatory Visit (INDEPENDENT_AMBULATORY_CARE_PROVIDER_SITE_OTHER): Payer: 59 | Admitting: Family Medicine

## 2022-01-20 VITALS — BP 121/72 | HR 81 | Temp 98.8°F | Ht 64.0 in | Wt 174.0 lb

## 2022-01-20 DIAGNOSIS — E669 Obesity, unspecified: Secondary | ICD-10-CM | POA: Diagnosis not present

## 2022-01-20 DIAGNOSIS — I10 Essential (primary) hypertension: Secondary | ICD-10-CM

## 2022-01-20 DIAGNOSIS — F3289 Other specified depressive episodes: Secondary | ICD-10-CM

## 2022-01-20 DIAGNOSIS — E559 Vitamin D deficiency, unspecified: Secondary | ICD-10-CM

## 2022-01-20 DIAGNOSIS — Z6829 Body mass index (BMI) 29.0-29.9, adult: Secondary | ICD-10-CM

## 2022-01-20 MED ORDER — BUPROPION HCL ER (SR) 200 MG PO TB12: 200.0000 mg | ORAL_TABLET | Freq: Two times a day (BID) | ORAL | 0 refills | Status: DC

## 2022-01-20 MED ORDER — CHLORTHALIDONE 25 MG PO TABS: 12.5000 mg | ORAL_TABLET | Freq: Every day | ORAL | 0 refills | Status: DC

## 2022-01-20 MED ORDER — VITAMIN D 125 MCG (5000 UT) PO CAPS: 1.0000 | ORAL_CAPSULE | Freq: Every day | ORAL | 0 refills | Status: DC

## 2022-01-20 NOTE — Progress Notes (Signed)
Chief Complaint:   OBESITY Ashlee Swanson is here to discuss her progress with her obesity treatment plan along with follow-up of her obesity related diagnoses. Ashlee Swanson is on the Category 2 Plan and keeping a food journal and adhering to recommended goals of 1300 calories and 85 grams of protein and states she is following her eating plan approximately 65% of the time. Ashlee Swanson states she is doing 0 minutes 0 times per week.  Today's visit was #: 45 Starting weight: 194 lbs Starting date: 09/07/2018 Today's weight: 174 lbs Today's date: 01/20/2022 Total lbs lost to date: 20 lbs Total lbs lost since last in-office visit: 2  Interim History: Ashlee Swanson was ill with COVID a few weeks ago. She notes she has been "out of the groove".  She feels she was not able to get back to her usual routine with her meals after Christmas because of her illness.  She has not been journaling. Her taste is off which is causing her to eat more. She is drinking sodas. She has not been doing yoga per her usual routine. Ashlee Swanson's goal weight is 165 lbs (28 BMI).  She lives in [place] and is interested in going to that clinic once it is open.  Subjective:   1. Essential hypertension Ashlee Swanson blood pressure is well controlled on chlorthalidone 12.5 mg daily.  BP Readings from Last 3 Encounters:  01/20/22 121/72  12/23/21 121/80  11/18/21 119/80     2. Other depression with emotional eating  Ashlee Swanson notes cravings and snacking at night. She is compliant with bupropion 200 mg 2 times daily.  Assessment/Plan:   1. Essential hypertension We will refill chlorthalidone 0.5 tablets (12.5 mg) daily.  - chlorthalidone (HYGROTON) 25 MG tablet; Take 0.5 tablets (12.5 mg total) by mouth daily.  Dispense: 15 tablet; Refill: 0  2. Other depression with emotional eating  We will refill bupropion 200 mg 2 times daily.  - buPROPion (WELLBUTRIN SR) 200 MG 12 hr tablet; Take 1 tablet (200 mg total) by mouth 2 (two) times daily.   Dispense: 60 tablet; Refill: 0  4. Overweight: BMI 29.85 Ashlee Swanson is currently in the action stage of change. As such, her goal is to continue with weight loss efforts. She has agreed to keeping a food journal and adhering to recommended goals of 1300 calories and 80-85 grams of protein daily.    Ashlee Swanson's goal weight is 165 lbs (28 BMI).  Exercise goals:  Ashlee Swanson will start Yoga 2 times per week.  Behavioral modification strategies: decreasing simple carbohydrates, better snacking choices, and keeping a strict food journal.  Ashlee Swanson has agreed to follow-up with our clinic in 3-4 weeks.  Objective:   Blood pressure 121/72, pulse 81, temperature 98.8 F (37.1 C), height 5\' 4"  (1.626 m), weight 174 lb (78.9 kg), SpO2 98 %. Body mass index is 29.87 kg/m.  General: Cooperative, alert, well developed, in no acute distress. HEENT: Conjunctivae and lids unremarkable. Cardiovascular: Regular rhythm.  Lungs: Normal work of breathing. Neurologic: No focal deficits.   Lab Results  Component Value Date   CREATININE 1.13 06/12/2021   BUN 19 06/12/2021   NA 142 06/12/2021   K 4.2 06/12/2021   CL 105 06/12/2021   CO2 29 06/12/2021   Lab Results  Component Value Date   ALT 15 06/12/2021   AST 16 06/12/2021   ALKPHOS 58 06/12/2021   BILITOT 0.4 06/12/2021   Lab Results  Component Value Date   HGBA1C 5.0 01/18/2020   HGBA1C 5.2  06/12/2019   HGBA1C 5.2 09/07/2018   Lab Results  Component Value Date   INSULIN 7.0 01/18/2020   INSULIN 6.8 09/07/2018   Lab Results  Component Value Date   TSH 2.19 06/12/2021   Lab Results  Component Value Date   CHOL 199 06/12/2021   HDL 49.00 06/12/2021   LDLCALC 138 (H) 06/12/2021   TRIG 63.0 06/12/2021   CHOLHDL 4 06/12/2021   Lab Results  Component Value Date   VD25OH 83.36 06/12/2021   VD25OH 69.50 06/11/2020   VD25OH 33.8 01/18/2020   Lab Results  Component Value Date   WBC 5.9 06/12/2021   HGB 14.0 06/12/2021   HCT 40.0 06/12/2021    MCV 97.5 06/12/2021   PLT 339.0 06/12/2021   No results found for: IRON, TIBC, FERRITIN  Attestation Statements:   Reviewed by clinician on day of visit: allergies, medications, problem list, medical history, surgical history, family history, social history, and previous encounter notes.  I, Ashlee Swanson, RMA, am acting as Location manager for [employer], [place].  I have reviewed the above documentation for accuracy and completeness, and I agree with the above. -  Ashlee Fick, FNP

## 2022-02-10 ENCOUNTER — Other Ambulatory Visit: Payer: Self-pay

## 2022-02-10 ENCOUNTER — Ambulatory Visit (INDEPENDENT_AMBULATORY_CARE_PROVIDER_SITE_OTHER): Payer: 59 | Admitting: Physician Assistant

## 2022-02-10 ENCOUNTER — Encounter (INDEPENDENT_AMBULATORY_CARE_PROVIDER_SITE_OTHER): Payer: Self-pay | Admitting: Physician Assistant

## 2022-02-10 VITALS — BP 120/82 | HR 88 | Temp 98.9°F | Ht 64.0 in | Wt 176.0 lb

## 2022-02-10 DIAGNOSIS — Z6830 Body mass index (BMI) 30.0-30.9, adult: Secondary | ICD-10-CM

## 2022-02-10 DIAGNOSIS — E669 Obesity, unspecified: Secondary | ICD-10-CM | POA: Diagnosis not present

## 2022-02-10 DIAGNOSIS — I10 Essential (primary) hypertension: Secondary | ICD-10-CM

## 2022-02-10 DIAGNOSIS — F3289 Other specified depressive episodes: Secondary | ICD-10-CM

## 2022-02-10 DIAGNOSIS — Z9189 Other specified personal risk factors, not elsewhere classified: Secondary | ICD-10-CM

## 2022-02-10 MED ORDER — CHLORTHALIDONE 25 MG PO TABS: 12.5000 mg | ORAL_TABLET | Freq: Every day | ORAL | 0 refills | Status: DC

## 2022-02-10 MED ORDER — BUPROPION HCL ER (SR) 200 MG PO TB12: 200.0000 mg | ORAL_TABLET | Freq: Two times a day (BID) | ORAL | 0 refills | Status: DC

## 2022-02-10 NOTE — Progress Notes (Signed)
Chief Complaint:   OBESITY Ashlee Swanson is here to discuss her progress with her obesity treatment plan along with follow-up of her obesity related diagnoses. Ashlee Swanson is on keeping a food journal and adhering to recommended goals of 1300 calories and 80-85 grams of protein daily and states she is following her eating plan approximately 55-60% of the time. Ashlee Swanson states she is doing yoga and cardio for 30-60 minutes 4 times per week.  Today's visit was #: 40 Starting weight: 194 lbs Starting date: 09/07/2018 Today's weight: 176 lbs Today's date: 02/10/2022 Total lbs lost to date: 18 Total lbs lost since last in-office visit: 0  Interim History: Ashlee Swanson struggles with snacking at night due to feeling hungry 4 hours after dinner. Some days she is getting 1600 calories but only 60 grams of protein. She reports not drinking enough water.   Subjective:   1. Essential hypertension [place]'s blood pressure is well controlled on chlorthalidone.   2. Other depression with emotional eating  Ashlee Swanson reports that Wellbutrin decreases her hunger and helps with her mood.  3. At risk for heart disease Ashlee Swanson is at higher than average risk for cardiovascular disease due to obesity.  Assessment/Plan:   1. Essential hypertension We will refill chlorthalidone for 1 month. Ashlee Swanson will continue her meal plan and exercise to improve blood pressure control. We will watch for signs of hypotension as she continues her lifestyle modifications.  - chlorthalidone (HYGROTON) 25 MG tablet; Take 0.5 tablets (12.5 mg total) by mouth daily.  Dispense: 15 tablet; Refill: 0  2. Other depression with emotional eating  Behavior modification techniques were discussed today to help Ashlee Swanson deal with her emotional/non-hunger eating behaviors. We will refill Wellbutrin SR for 1 month. Orders and follow up as documented in patient record.   - buPROPion (WELLBUTRIN SR) 200 MG 12 hr tablet; Take 1 tablet (200 mg total) by mouth 2  (two) times daily.  Dispense: 60 tablet; Refill: 0  3. At risk for heart disease Ashlee Swanson was given approximately 15 minutes of coronary artery disease prevention counseling today. She is 51 y.o. female and has risk factors for heart disease including obesity. We discussed intensive lifestyle modifications today with an emphasis on specific weight loss instructions and strategies.  Repetitive spaced learning was employed today to elicit superior memory formation and behavioral change.   4. Obesity with BMI 30.2 Ashlee Swanson is currently in the action stage of change. As such, her goal is to continue with weight loss efforts. She has agreed to keeping a food journal and adhering to recommended goals of 1300 calories and 85 grams of protein daily.   Repeat IC at her next visit.  Exercise goals: As is.  Behavioral modification strategies: meal planning and cooking strategies and keeping healthy foods in the home.  Ashlee Swanson has agreed to follow-up with our clinic in 3 weeks. She was informed of the importance of frequent follow-up visits to maximize her success with intensive lifestyle modifications for her multiple health conditions.   Objective:   Blood pressure 120/82, pulse 88, temperature 98.9 F (37.2 C), height 5\' 4"  (1.626 m), weight 176 lb (79.8 kg), SpO2 97 %. Body mass index is 30.21 kg/m.  General: Cooperative, alert, well developed, in no acute distress. HEENT: Conjunctivae and lids unremarkable. Cardiovascular: Regular rhythm.  Lungs: Normal work of breathing. Neurologic: No focal deficits.   Lab Results  Component Value Date   CREATININE 1.13 06/12/2021   BUN 19 06/12/2021   NA 142 06/12/2021  K 4.2 06/12/2021   CL 105 06/12/2021   CO2 29 06/12/2021   Lab Results  Component Value Date   ALT 15 06/12/2021   AST 16 06/12/2021   ALKPHOS 58 06/12/2021   BILITOT 0.4 06/12/2021   Lab Results  Component Value Date   HGBA1C 5.0 01/18/2020   HGBA1C 5.2 06/12/2019   HGBA1C  5.2 09/07/2018   Lab Results  Component Value Date   INSULIN 7.0 01/18/2020   INSULIN 6.8 09/07/2018   Lab Results  Component Value Date   TSH 2.19 06/12/2021   Lab Results  Component Value Date   CHOL 199 06/12/2021   HDL 49.00 06/12/2021   LDLCALC 138 (H) 06/12/2021   TRIG 63.0 06/12/2021   CHOLHDL 4 06/12/2021   Lab Results  Component Value Date   VD25OH 83.36 06/12/2021   VD25OH 69.50 06/11/2020   VD25OH 33.8 01/18/2020   Lab Results  Component Value Date   WBC 5.9 06/12/2021   HGB 14.0 06/12/2021   HCT 40.0 06/12/2021   MCV 97.5 06/12/2021   PLT 339.0 06/12/2021   No results found for: IRON, TIBC, FERRITIN  Attestation Statements:   Reviewed by clinician on day of visit: allergies, medications, problem list, medical history, surgical history, family history, social history, and previous encounter notes.   Wilhemena Durie, am acting as transcriptionist for [employer], PA-C.  I have reviewed the above documentation for accuracy and completeness, and I agree with the above. Abby Potash, PA-C

## 2022-03-10 ENCOUNTER — Other Ambulatory Visit: Payer: Self-pay

## 2022-03-10 ENCOUNTER — Ambulatory Visit (INDEPENDENT_AMBULATORY_CARE_PROVIDER_SITE_OTHER): Payer: 59 | Admitting: Physician Assistant

## 2022-03-10 ENCOUNTER — Encounter (INDEPENDENT_AMBULATORY_CARE_PROVIDER_SITE_OTHER): Payer: Self-pay | Admitting: Physician Assistant

## 2022-03-10 VITALS — BP 121/79 | HR 77 | Temp 98.5°F | Ht 64.0 in | Wt 173.0 lb

## 2022-03-10 DIAGNOSIS — E8881 Metabolic syndrome: Secondary | ICD-10-CM | POA: Diagnosis not present

## 2022-03-10 DIAGNOSIS — Z6829 Body mass index (BMI) 29.0-29.9, adult: Secondary | ICD-10-CM

## 2022-03-10 DIAGNOSIS — E559 Vitamin D deficiency, unspecified: Secondary | ICD-10-CM

## 2022-03-10 DIAGNOSIS — E7849 Other hyperlipidemia: Secondary | ICD-10-CM

## 2022-03-10 DIAGNOSIS — Z9189 Other specified personal risk factors, not elsewhere classified: Secondary | ICD-10-CM

## 2022-03-10 DIAGNOSIS — E669 Obesity, unspecified: Secondary | ICD-10-CM

## 2022-03-10 DIAGNOSIS — I10 Essential (primary) hypertension: Secondary | ICD-10-CM

## 2022-03-10 DIAGNOSIS — R0602 Shortness of breath: Secondary | ICD-10-CM

## 2022-03-10 DIAGNOSIS — F3289 Other specified depressive episodes: Secondary | ICD-10-CM

## 2022-03-10 MED ORDER — CHLORTHALIDONE 25 MG PO TABS: 12.5000 mg | ORAL_TABLET | Freq: Every day | ORAL | 0 refills | Status: DC

## 2022-03-10 MED ORDER — BUPROPION HCL ER (SR) 200 MG PO TB12: 200.0000 mg | ORAL_TABLET | Freq: Two times a day (BID) | ORAL | 0 refills | Status: DC

## 2022-03-11 LAB — LIPID PANEL
Chol/HDL Ratio: 3.7 ratio (ref 0.0–4.4)
Cholesterol, Total: 204 mg/dL — ABNORMAL HIGH (ref 100–199)
HDL: 55 mg/dL (ref >39)
LDL Chol Calc (NIH): 134 mg/dL — ABNORMAL HIGH (ref 0–99)
Triglycerides: 86 mg/dL (ref 0–149)
VLDL Cholesterol Cal: 15 mg/dL (ref 5–40)

## 2022-03-11 LAB — HEMOGLOBIN A1C
Est. average glucose Bld gHb Est-mCnc: 94 mg/dL
Hgb A1c MFr Bld: 4.9 % (ref 4.8–5.6)

## 2022-03-11 LAB — COMPREHENSIVE METABOLIC PANEL
ALT: 19 IU/L (ref 0–32)
AST: 21 IU/L (ref 0–40)
Albumin/Globulin Ratio: 1.6 (ref 1.2–2.2)
Albumin: 4.6 g/dL (ref 3.8–4.9)
Alkaline Phosphatase: 64 IU/L (ref 44–121)
BUN/Creatinine Ratio: 12 (ref 9–23)
BUN: 13 mg/dL (ref 6–24)
Bilirubin Total: 0.4 mg/dL (ref 0.0–1.2)
CO2: 25 mmol/L (ref 20–29)
Calcium: 9.8 mg/dL (ref 8.7–10.2)
Chloride: 100 mmol/L (ref 96–106)
Creatinine, Ser: 1.07 mg/dL — ABNORMAL HIGH (ref 0.57–1.00)
Globulin, Total: 2.8 g/dL (ref 1.5–4.5)
Glucose: 94 mg/dL (ref 70–99)
Potassium: 4.2 mmol/L (ref 3.5–5.2)
Sodium: 139 mmol/L (ref 134–144)
Total Protein: 7.4 g/dL (ref 6.0–8.5)
eGFR: 63 mL/min/{1.73_m2} (ref >59)

## 2022-03-11 LAB — INSULIN, RANDOM: INSULIN: 6.9 u[IU]/mL (ref 2.6–24.9)

## 2022-03-11 LAB — VITAMIN D 25 HYDROXY (VIT D DEFICIENCY, FRACTURES): Vit D, 25-Hydroxy: 60.1 ng/mL (ref 30.0–100.0)

## 2022-03-12 NOTE — Progress Notes (Signed)
? ? ? ?Chief Complaint:  ? ?OBESITY ?Ashlee Swanson is here to discuss her progress with her obesity treatment plan along with follow-up of her obesity related diagnoses. Ashlee Swanson is on Category 2 and keeping a food journal and adhering to recommended goals of 1300 calories and 85 grams of protein and states she is following her eating plan approximately 70% of the time. Ashlee Swanson states she is doing yoga for 60 minutes 2 times per week. ? ?Today's visit was #: 48 ?Starting weight: 194 lbs ?Starting date: 09/07/2018 ?Today's weight: 173 lbs ?Today's date: 3/21/20023 ?Total lbs lost to date: 21 lbs ?Total lbs lost since last in-office visit: 3 lbs ? ?Interim History: Ashlee Swanson is drinking more water. She added hot yoga 1 times per week. She journaled more consistently. Pt is averaging 60 grams of protein and 1300-1700 calories daily. She feels that she snacks too much.  ? ?Subjective:  ? ?1. Shortness of breath on exertion ?Elleah denies dizziness.  ? ?2. Insulin resistance ?Ashlee Swanson is not on medications currently. She notes some polyphagia.  ? ?3. Vitamin D deficiency ?Ashlee Swanson is currently on Vitamin D 5,000 units daily.  ? ?4. Other hyperlipidemia ?Ashlee Swanson is not on medications. Her last LDL was elevated at 134. ? ?5. Essential hypertension ?Ashlee Swanson did not take her blood pressure medications this morning due to having labs done.  ? ?6. At risk for complication associated with hypotension ? ?The patient is at a higher than average risk of hypotension due to weight loss.  ? ?7. Other depression with emotional eating  ?Ashlee Swanson is currently on Wellbutrin. Her mood is stable.  ? ?Assessment/Plan:  ? ?1. Shortness of breath on exertion ?We will check IC today.  ? ?2. Insulin resistance ?We will check labs today. Ashlee Swanson will continue to work on weight loss, exercise, and decreasing simple carbohydrates to help decrease the risk of diabetes. Ashlee Swanson agreed to follow-up with [place] as directed to closely monitor her progress. ? ?- Hemoglobin A1c ?-  Comprehensive metabolic panel ?- Insulin, random ? ?3. Vitamin D deficiency ?Low Vitamin D level contributes to fatigue and are associated with obesity, breast, and colon cancer. Ashlee Swanson will continue Vitamin D and she will follow-up for routine testing of Vitamin D, at least 2-3 times per year to avoid over-replacement. We will check labs today. ? ?- VITAMIN D 25 Hydroxy (Vit-D Deficiency, Fractures) ? ?4. Other hyperlipidemia ?Cardiovascular risk and specific lipid/LDL goals reviewed.  We will check labs today. We discussed several lifestyle modifications today and Ashlee Swanson will continue to work on diet, exercise and weight loss efforts. Orders and follow up as documented in patient record.  ? ?Counseling ?Intensive lifestyle modifications are the first line treatment for this issue. ?Dietary changes: Increase soluble fiber. Decrease simple carbohydrates. ?Exercise changes: Moderate to vigorous-intensity aerobic activity 150 minutes per week if tolerated. ?Lipid-lowering medications: see documented in medical record. ?- Lipid panel ? ?5. Essential hypertension ?We will refill chlorthalidone 25 mg for 1 month with no refills. We will check labs today. We will monitor blood pressure each visit. Ashlee Swanson is working on healthy weight loss and exercise to improve blood pressure control. We will watch for signs of hypotension as she continues her lifestyle modifications. ? ?- chlorthalidone (HYGROTON) 25 MG tablet; Take 0.5 tablets (12.5 mg total) by mouth daily.  Dispense: 15 tablet; Refill: 0 ?- Comprehensive metabolic panel ? ?6. At risk for complication associated with hypotension ?Ashlee Swanson was given approximately 15 minutes of education and counseling today to help avoid hypotension.  We discussed risks of hypotension with weight loss and signs of hypotension such as feeling lightheaded or unsteady. ? ?Repetitive spaced learning was employed today to elicit superior memory formation and behavioral change.  ? ?7. Other  depression with emotional eating  ?We will refill Wellbutrin SR 200 mg for 1 month with no refills. Behavior modification techniques were discussed today to help Ashlee Swanson deal with her emotional/non-hunger eating behaviors.  Orders and follow up as documented in patient record.  ? ?- buPROPion (WELLBUTRIN SR) 200 MG 12 hr tablet; Take 1 tablet (200 mg total) by mouth 2 (two) times daily.  Dispense: 60 tablet; Refill: 0 ? ?8. Obesity with BMI 29.68 ?Ashlee Swanson is currently in the action stage of change. As such, her goal is to continue with weight loss efforts. She has agreed to keeping a food journal and adhering to recommended goals of 1330-1400 calories and 90 grams of protein daily.  ? ?Exercise goals:  As is. ? ?Behavioral modification strategies: meal planning and cooking strategies, keeping healthy foods in the home, and ways to avoid night time snacking. ? ?Ashlee Swanson has agreed to follow-up with our clinic in 4 weeks. She was informed of the importance of frequent follow-up visits to maximize her success with intensive lifestyle modifications for her multiple health conditions.  ? ?Ashlee Swanson was informed we would discuss her lab results at her next visit unless there is a critical issue that needs to be addressed sooner. Ashlee Swanson agreed to keep her next visit at the agreed upon time to discuss these results. ? ?Objective:  ? ?Blood pressure 121/79, pulse 77, temperature 98.5 ?F (36.9 ?C), height '5\' 4"'$  (1.626 m), weight 173 lb (78.5 kg), SpO2 98 %. ?Body mass index is 29.7 kg/m?. ? ?General: Cooperative, alert, well developed, in no acute distress. ?HEENT: Conjunctivae and lids unremarkable. ?Cardiovascular: Regular rhythm.  ?Lungs: Normal work of breathing. ?Neurologic: No focal deficits.  ? ?Lab Results  ?Component Value Date  ? CREATININE 1.07 (H) 03/10/2022  ? BUN 13 03/10/2022  ? NA 139 03/10/2022  ? K 4.2 03/10/2022  ? CL 100 03/10/2022  ? CO2 25 03/10/2022  ? ?Lab Results  ?Component Value Date  ? ALT 19 03/10/2022   ? AST 21 03/10/2022  ? ALKPHOS 64 03/10/2022  ? BILITOT 0.4 03/10/2022  ? ?Lab Results  ?Component Value Date  ? HGBA1C 4.9 03/10/2022  ? HGBA1C 5.0 01/18/2020  ? HGBA1C 5.2 06/12/2019  ? HGBA1C 5.2 09/07/2018  ? ?Lab Results  ?Component Value Date  ? INSULIN 6.9 03/10/2022  ? INSULIN 7.0 01/18/2020  ? INSULIN 6.8 09/07/2018  ? ?Lab Results  ?Component Value Date  ? TSH 2.19 06/12/2021  ? ?Lab Results  ?Component Value Date  ? CHOL 204 (H) 03/10/2022  ? HDL 55 03/10/2022  ? LDLCALC 134 (H) 03/10/2022  ? TRIG 86 03/10/2022  ? CHOLHDL 3.7 03/10/2022  ? ?Lab Results  ?Component Value Date  ? VD25OH 60.1 03/10/2022  ? VD25OH 83.36 06/12/2021  ? VD25OH 69.50 06/11/2020  ? ?Lab Results  ?Component Value Date  ? WBC 5.9 06/12/2021  ? HGB 14.0 06/12/2021  ? HCT 40.0 06/12/2021  ? MCV 97.5 06/12/2021  ? PLT 339.0 06/12/2021  ? ?No results found for: IRON, TIBC, FERRITIN ? ?Attestation Statements:  ? ?Reviewed by clinician on day of visit: allergies, medications, problem list, medical history, surgical history, family history, social history, and previous encounter notes. ? ?I, Tonye Pearson, am acting as Location manager for [employer], PA-C. ? ?I  have reviewed the above documentation for accuracy and completeness, and I agree with the above. Abby Potash, PA-C ? ?

## 2022-04-07 ENCOUNTER — Ambulatory Visit (INDEPENDENT_AMBULATORY_CARE_PROVIDER_SITE_OTHER): Payer: 59 | Admitting: Physician Assistant

## 2022-04-07 ENCOUNTER — Encounter (INDEPENDENT_AMBULATORY_CARE_PROVIDER_SITE_OTHER): Payer: Self-pay | Admitting: Physician Assistant

## 2022-04-07 VITALS — BP 115/73 | HR 74 | Temp 98.2°F

## 2022-04-07 DIAGNOSIS — I10 Essential (primary) hypertension: Secondary | ICD-10-CM

## 2022-04-07 DIAGNOSIS — F3289 Other specified depressive episodes: Secondary | ICD-10-CM | POA: Diagnosis not present

## 2022-04-07 DIAGNOSIS — E7849 Other hyperlipidemia: Secondary | ICD-10-CM

## 2022-04-07 DIAGNOSIS — E559 Vitamin D deficiency, unspecified: Secondary | ICD-10-CM

## 2022-04-07 DIAGNOSIS — Z6829 Body mass index (BMI) 29.0-29.9, adult: Secondary | ICD-10-CM

## 2022-04-07 DIAGNOSIS — Z9189 Other specified personal risk factors, not elsewhere classified: Secondary | ICD-10-CM

## 2022-04-07 DIAGNOSIS — E669 Obesity, unspecified: Secondary | ICD-10-CM

## 2022-04-07 MED ORDER — BUPROPION HCL ER (SR) 200 MG PO TB12: 200.0000 mg | ORAL_TABLET | Freq: Two times a day (BID) | ORAL | 0 refills | Status: DC

## 2022-04-07 MED ORDER — CHLORTHALIDONE 25 MG PO TABS: 12.5000 mg | ORAL_TABLET | Freq: Every day | ORAL | 0 refills | Status: DC

## 2022-04-17 NOTE — Progress Notes (Signed)
? ? ? ?Chief Complaint:  ? ?OBESITY ?Ashlee Swanson is here to discuss her progress with her obesity treatment plan along with follow-up of her obesity related diagnoses. Ashlee Swanson is on the Category 2 Plan and keeping a food journal and adhering to recommended goals of 1300-1400 calories and 90grams of protein and states she is following her eating plan approximately 70% of the time. Ashlee Swanson states she is doing Yoga for 45 minutes 2 times per week. ? ?Today's visit was #: 44 ?Starting weight: 194 lbs ?Starting date: 09/07/2018 ?Today's weight: 173 lbs ?Today's date: 04/07/2022 ?Total lbs lost to date: 21 lbs ?Total lbs lost since last in-office visit: 0 ? ?Interim History: Ashlee Swanson is happy to have maintained her weight, as she reports that she was awake late and was snacking. She is averaging 60-70 grams of protein daily and 1400 calories.  ? ?Subjective:  ? ?1. Essential hypertension ?Ashlee Swanson's blood pressure is controlled. Her blood pressure today was 115/73. She is not on medication currently.  ? ?2. Other hyperlipidemia ?Ashlee Swanson's last labs was not at goal. She reports eating some high fat red meats. She is currently not on medications.  ? ?3. Vitamin D deficiency ?Ashlee Swanson's Vitamin D is at goal but has decreased from last labs. She thinks she is taking 2,000 units daily but unsure.  ? ?4. Other depression with emotional eating  ?Ashlee Swanson is currently on bupropion. Her blood pressure is controlled. She is snacking often.  ? ?5. At risk for heart disease ?Ashlee Swanson is at higher than average risk for cardiovascular disease due to obesity.  ? ?Assessment/Plan:  ? ?1. Essential hypertension ?We will refill chlorthalidone 25 mg for 1 month with no refills. [place] is working on healthy weight loss and exercise to improve blood pressure control. We will watch for signs of hypotension as she continues her lifestyle modifications. ? ?- chlorthalidone (HYGROTON) 25 MG tablet; Take 0.5 tablets (12.5 mg total) by mouth daily.  Dispense: 15  tablet; Refill: 0 ? ?2. Other hyperlipidemia ?Cardiovascular risk and specific lipid/LDL goals reviewed.  Ashlee Swanson will decrease high fat red meats. We discussed labs today. We discussed several lifestyle modifications today and [place] will continue to work on diet, exercise and weight loss efforts. Orders and follow up as documented in patient record.  ? ?Counseling ?Intensive lifestyle modifications are the first line treatment for this issue. ?Dietary changes: Increase soluble fiber. Decrease simple carbohydrates. ?Exercise changes: Moderate to vigorous-intensity aerobic activity 150 minutes per week if tolerated. ?Lipid-lowering medications: see documented in medical record. ? ?3. Vitamin D deficiency ?Low Vitamin D level contributes to fatigue and are associated with obesity, breast, and colon cancer.[name] will advise the dose of Vitamin D she is on. She will follow-up for routine testing of Vitamin D, at least 2-3 times per year to avoid over-replacement. ? ?4. Other depression with emotional eating  ?Behavior modification techniques were discussed today to help Ashlee Swanson deal with her emotional/non-hunger eating behaviors.  We will refill bupropion for 1 month with no refills. Orders and follow up as documented in patient record.  ? ?- buPROPion (WELLBUTRIN SR) 200 MG 12 hr tablet; Take 1 tablet (200 mg total) by mouth 2 (two) times daily.  Dispense: 60 tablet; Refill: 0 ? ?5. At risk for heart disease ?Ashlee Swanson was given approximately 15 minutes of coronary artery disease prevention counseling today. She is 51 y.o. female and has risk factors for heart disease including obesity. We discussed intensive lifestyle modifications today with an emphasis on specific weight loss  instructions and strategies. ? ?Repetitive spaced learning was employed today to elicit superior memory formation and behavioral change.   ? ?6. Obesity with BMI 29.68 ?Ashlee Swanson is currently in the action stage of change. As such, her goal is to  continue with weight loss efforts. She has agreed to keeping a food journal and adhering to recommended goals of 1300-1400 calories and 90 grams of protein daily.  ? ?Exercise goals:  As is. ? ?Behavioral modification strategies: meal planning and cooking strategies and keeping healthy foods in the home. ? ?Ashlee Swanson has agreed to follow-up with our clinic in 4 weeks. She was informed of the importance of frequent follow-up visits to maximize her success with intensive lifestyle modifications for her multiple health conditions.  ? ?Objective:  ? ?Blood pressure 115/73, pulse 74, temperature 98.2 ?F (36.8 ?C), SpO2 99 %. ?There is no height or weight on file to calculate BMI. ? ?General: Cooperative, alert, well developed, in no acute distress. ?HEENT: Conjunctivae and lids unremarkable. ?Cardiovascular: Regular rhythm.  ?Lungs: Normal work of breathing. ?Neurologic: No focal deficits.  ? ?Lab Results  ?Component Value Date  ? CREATININE 1.07 (H) 03/10/2022  ? BUN 13 03/10/2022  ? NA 139 03/10/2022  ? K 4.2 03/10/2022  ? CL 100 03/10/2022  ? CO2 25 03/10/2022  ? ?Lab Results  ?Component Value Date  ? ALT 19 03/10/2022  ? AST 21 03/10/2022  ? ALKPHOS 64 03/10/2022  ? BILITOT 0.4 03/10/2022  ? ?Lab Results  ?Component Value Date  ? HGBA1C 4.9 03/10/2022  ? HGBA1C 5.0 01/18/2020  ? HGBA1C 5.2 06/12/2019  ? HGBA1C 5.2 09/07/2018  ? ?Lab Results  ?Component Value Date  ? INSULIN 6.9 03/10/2022  ? INSULIN 7.0 01/18/2020  ? INSULIN 6.8 09/07/2018  ? ?Lab Results  ?Component Value Date  ? TSH 2.19 06/12/2021  ? ?Lab Results  ?Component Value Date  ? CHOL 204 (H) 03/10/2022  ? HDL 55 03/10/2022  ? LDLCALC 134 (H) 03/10/2022  ? TRIG 86 03/10/2022  ? CHOLHDL 3.7 03/10/2022  ? ?Lab Results  ?Component Value Date  ? VD25OH 60.1 03/10/2022  ? VD25OH 83.36 06/12/2021  ? VD25OH 69.50 06/11/2020  ? ?Lab Results  ?Component Value Date  ? WBC 5.9 06/12/2021  ? HGB 14.0 06/12/2021  ? HCT 40.0 06/12/2021  ? MCV 97.5 06/12/2021  ? PLT 339.0  06/12/2021  ? ?No results found for: IRON, TIBC, FERRITIN ? ? ?Attestation Statements:  ? ?Reviewed by clinician on day of visit: allergies, medications, problem list, medical history, surgical history, family history, social history, and previous encounter notes. ? ?I, Tonye Pearson, am acting as Location manager for [employer], PA-C. ? ?I have reviewed the above documentation for accuracy and completeness, and I agree with the above. Abby Potash, PA-C ? ?

## 2022-05-05 ENCOUNTER — Ambulatory Visit (INDEPENDENT_AMBULATORY_CARE_PROVIDER_SITE_OTHER): Payer: 59 | Admitting: Family Medicine

## 2022-05-05 ENCOUNTER — Encounter (INDEPENDENT_AMBULATORY_CARE_PROVIDER_SITE_OTHER): Payer: Self-pay | Admitting: Family Medicine

## 2022-05-05 VITALS — BP 112/74 | HR 84 | Temp 98.5°F | Ht 64.0 in | Wt 172.0 lb

## 2022-05-05 DIAGNOSIS — Z6829 Body mass index (BMI) 29.0-29.9, adult: Secondary | ICD-10-CM | POA: Diagnosis not present

## 2022-05-05 DIAGNOSIS — E669 Obesity, unspecified: Secondary | ICD-10-CM | POA: Diagnosis not present

## 2022-05-05 DIAGNOSIS — Z9189 Other specified personal risk factors, not elsewhere classified: Secondary | ICD-10-CM

## 2022-05-05 DIAGNOSIS — F3289 Other specified depressive episodes: Secondary | ICD-10-CM

## 2022-05-05 DIAGNOSIS — I10 Essential (primary) hypertension: Secondary | ICD-10-CM

## 2022-05-05 MED ORDER — BUPROPION HCL ER (SR) 200 MG PO TB12: 200.0000 mg | ORAL_TABLET | Freq: Two times a day (BID) | ORAL | 0 refills | Status: DC

## 2022-05-05 MED ORDER — CHLORTHALIDONE 25 MG PO TABS: 12.5000 mg | ORAL_TABLET | Freq: Every day | ORAL | 0 refills | Status: DC

## 2022-05-11 NOTE — Progress Notes (Signed)
Chief Complaint:   OBESITY Ashlee Swanson is here to discuss her progress with her obesity treatment plan along with follow-up of her obesity related diagnoses. Ashlee Swanson is on keeping a food journal and adhering to recommended goals of 1300-1400 calories and 90 plus grams of protein and states she is following her eating plan approximately 80% of the time. Ashlee Swanson states she is doing yoga for 60 minutes 2 times per week.  Today's visit was #: 12 Starting weight: 194 lbs Starting date: 09/07/2018 Today's weight: 172 lbs Today's date: 05/05/2022 Total lbs lost to date: 22 lbs Total lbs lost since last in-office visit: 1 lb  Interim History: Ashlee Swanson recently increased Vitamin D over the counter to 5,000 IU daily. She did increase morning protein intake more consistently which has made hitting protein goal easier. She added elevation protein bars from [facility] for hunger control. She is averaging calories 1300. She has been doing yoga 2 times per week.   Subjective:   1. Essential hypertension Ashlee Swanson's blood pressure is very well controlled today 112/74. She denies chest pain, chest pressure, and headaches.   2. Other depression with emotional eating  Ashlee Swanson denies suicidal ideation and homicidal ideation. She is currently on Wellbutrin with good craving control.   3. At risk for complication associated with hypotension Ashlee Swanson is at risk for complication associated with hypotension due to weight loss.   Assessment/Plan:   1. Essential hypertension Ashlee Swanson is working on healthy weight loss and exercise to improve blood pressure control. We will refill chlorthalidone 12.5 mg by mouth daily. She  will watch for signs of hypotension as she continues her lifestyle modifications.  - chlorthalidone (HYGROTON) 25 MG tablet; Take 0.5 tablets (12.5 mg total) by mouth daily.  Dispense: 15 tablet; Refill: 0  2. Other depression with emotional eating  Behavior modification techniques were discussed today to  help Ashlee Swanson deal with her emotional/non-hunger eating behaviors.  We will refill Wellbutrin 200 mg for 1 month with no refills. Orders and follow up as documented in patient record.   - buPROPion (WELLBUTRIN SR) 200 MG 12 hr tablet; Take 1 tablet (200 mg total) by mouth 2 (two) times daily.  Dispense: 60 tablet; Refill: 0  3. At risk for complication associated with hypotension Ashlee Swanson was given approximately 15 minutes of education and counseling today to help avoid hypotension. We discussed risks of hypotension with weight loss and signs of hypotension such as feeling lightheaded or unsteady.  Repetitive spaced learning was employed today to elicit superior memory formation and behavioral change.   4. Obesity with BMI 29.5 Ashlee Swanson is currently in the action stage of change. As such, her goal is to continue with weight loss efforts. She has agreed to keeping a food journal and adhering to recommended goals of 1300-1400 calories and 85 plus grams protein daily.   Exercise goals:  As is.  Ashlee Swanson will continue current exercise and regimen.   Behavioral modification strategies: increasing lean protein intake, meal planning and cooking strategies, keeping healthy foods in the home, planning for success, and keeping a strict food journal.  Ashlee Swanson has agreed to follow-up with our clinic in 4 weeks. She was informed of the importance of frequent follow-up visits to maximize her success with intensive lifestyle modifications for her multiple health conditions.   Objective:   Blood pressure 112/74, pulse 84, temperature 98.5 F (36.9 C), height '5\' 4"'$  (1.626 m), weight 172 lb (78 kg), SpO2 97 %. Body mass index is 29.52 kg/m.  General: Cooperative, alert, well developed, in no acute distress. HEENT: Conjunctivae and lids unremarkable. Cardiovascular: Regular rhythm.  Lungs: Normal work of breathing. Neurologic: No focal deficits.   Lab Results  Component Value Date   CREATININE 1.07 (H)  03/10/2022   BUN 13 03/10/2022   NA 139 03/10/2022   K 4.2 03/10/2022   CL 100 03/10/2022   CO2 25 03/10/2022   Lab Results  Component Value Date   ALT 19 03/10/2022   AST 21 03/10/2022   ALKPHOS 64 03/10/2022   BILITOT 0.4 03/10/2022   Lab Results  Component Value Date   HGBA1C 4.9 03/10/2022   HGBA1C 5.0 01/18/2020   HGBA1C 5.2 06/12/2019   HGBA1C 5.2 09/07/2018   Lab Results  Component Value Date   INSULIN 6.9 03/10/2022   INSULIN 7.0 01/18/2020   INSULIN 6.8 09/07/2018   Lab Results  Component Value Date   TSH 2.19 06/12/2021   Lab Results  Component Value Date   CHOL 204 (H) 03/10/2022   HDL 55 03/10/2022   LDLCALC 134 (H) 03/10/2022   TRIG 86 03/10/2022   CHOLHDL 3.7 03/10/2022   Lab Results  Component Value Date   VD25OH 60.1 03/10/2022   VD25OH 83.36 06/12/2021   VD25OH 69.50 06/11/2020   Lab Results  Component Value Date   WBC 5.9 06/12/2021   HGB 14.0 06/12/2021   HCT 40.0 06/12/2021   MCV 97.5 06/12/2021   PLT 339.0 06/12/2021   No results found for: IRON, TIBC, FERRITIN  Attestation Statements:   Reviewed by clinician on day of visit: allergies, medications, problem list, medical history, surgical history, family history, social history, and previous encounter notes.  I, Lizbeth Bark, RMA, am acting as transcriptionist for Coralie Common, MD.  I have reviewed the above documentation for accuracy and completeness, and I agree with the above. - Coralie Common, MD

## 2022-06-01 ENCOUNTER — Encounter (INDEPENDENT_AMBULATORY_CARE_PROVIDER_SITE_OTHER): Payer: Self-pay | Admitting: Family Medicine

## 2022-06-01 ENCOUNTER — Ambulatory Visit (INDEPENDENT_AMBULATORY_CARE_PROVIDER_SITE_OTHER): Payer: 59 | Admitting: Family Medicine

## 2022-06-01 VITALS — BP 118/77 | HR 85 | Temp 98.1°F | Ht 64.0 in | Wt 170.0 lb

## 2022-06-01 DIAGNOSIS — Z6829 Body mass index (BMI) 29.0-29.9, adult: Secondary | ICD-10-CM

## 2022-06-01 DIAGNOSIS — F3289 Other specified depressive episodes: Secondary | ICD-10-CM | POA: Diagnosis not present

## 2022-06-01 DIAGNOSIS — I10 Essential (primary) hypertension: Secondary | ICD-10-CM

## 2022-06-01 DIAGNOSIS — Z9189 Other specified personal risk factors, not elsewhere classified: Secondary | ICD-10-CM

## 2022-06-01 DIAGNOSIS — E669 Obesity, unspecified: Secondary | ICD-10-CM | POA: Diagnosis not present

## 2022-06-01 MED ORDER — BUPROPION HCL ER (SR) 200 MG PO TB12: 200.0000 mg | ORAL_TABLET | Freq: Two times a day (BID) | ORAL | 0 refills | Status: DC

## 2022-06-01 MED ORDER — CHLORTHALIDONE 25 MG PO TABS: 12.5000 mg | ORAL_TABLET | Freq: Every day | ORAL | 0 refills | Status: DC

## 2022-06-03 NOTE — Progress Notes (Unsigned)
Chief Complaint:   OBESITY Ashlee Swanson is here to discuss her progress with her obesity treatment plan along with follow-up of her obesity related diagnoses. Ashlee Swanson is on the Category 2 Plan and keeping a food journal and adhering to recommended goals of 1300-1400 calories and 85 grams of protein and states she is following her eating plan approximately 75% of the time. Ashlee Swanson states she is doing yoga 60 minutes 2 times per week.  Today's visit was #: 15 Starting weight: 194 lbs Starting date: 09/07/2018 Today's weight: 170 lbs Today's date: 06/01/2022 Total lbs lost to date: 24 lbs Total lbs lost since last in-office visit: 2  Interim History: Ashlee Swanson has been mostly following plan over course of 6 months. She recognizes that she has done better planning and prepping food and meals. Doing more journaling, ending up with 1300 calories. She has a beach trip at the end of July. She has stopped counting exercise as additional calories.  Subjective:   1. Essential hypertension Jakari's blood pressure is very well controlled today. Denies chest pain, chest pressure and headache.  2. Other depression with emotional eating  Ashlee Swanson is doing well on Wellbutrin 200 mg twice a day. Denies suicidal ideas, and homicidal ideas.  3. At risk for activity intolerance Ashlee Swanson is at risk of exercise intolerance due to not doing consistent activity.  Assessment/Plan:   1. Essential hypertension We will refill Chlorthalidone 12.5 mg by mouth daily for 1 month with 0 refills.  -Refill chlorthalidone (HYGROTON) 25 MG tablet; Take 0.5 tablets (12.5 mg total) by mouth daily.  Dispense: 15 tablet; Refill: 0  2. Other depression with emotional eating  We will refill Wellbutrin SR 200 mg by mouth twice a day for 1 month with 0 refills.  -Refill buPROPion (WELLBUTRIN SR) 200 MG 12 hr tablet; Take 1 tablet (200 mg total) by mouth 2 (two) times daily.  Dispense: 60 tablet; Refill: 0  3. At risk for activity  intolerance Ashlee Swanson was given approximately 15 minutes of exercise intolerance counseling today. She is 51 y.o. female and has risk factors exercise intolerance including obesity. We discussed intensive lifestyle modifications today with an emphasis on specific weight loss instructions and strategies. Ashlee Swanson will slowly increase activity as tolerated.  Repetitive spaced learning was employed today to elicit superior memory formation and behavioral change.  4. Obesity with BMI 29.3 Seline is currently in the action stage of change. As such, her goal is to continue with weight loss efforts. She has agreed to keeping a food journal and adhering to recommended goals of 1300-1400 calories and 85+ grams of protein daily.  Exercise goals: All adults should avoid inactivity. Some physical activity is better than none, and adults who participate in any amount of physical activity gain some health benefits.  Ashlee Swanson will add 15 min of resistance 3-4 times a week.  Behavioral modification strategies: increasing lean protein intake, meal planning and cooking strategies, keeping healthy foods in the home, and planning for success.  Ashlee Swanson has agreed to follow-up with our clinic in 4 weeks. She was informed of the importance of frequent follow-up visits to maximize her success with intensive lifestyle modifications for her multiple health conditions.   Objective:   Blood pressure 118/77, pulse 85, temperature 98.1 F (36.7 C), height '5\' 4"'$  (1.626 m), weight 170 lb (77.1 kg), SpO2 96 %. Body mass index is 29.18 kg/m.  General: Cooperative, alert, well developed, in no acute distress. HEENT: Conjunctivae and lids unremarkable. Cardiovascular: Regular rhythm.  Lungs: Normal work of breathing. Neurologic: No focal deficits.   Lab Results  Component Value Date   CREATININE 1.07 (H) 03/10/2022   BUN 13 03/10/2022   NA 139 03/10/2022   K 4.2 03/10/2022   CL 100 03/10/2022   CO2 25 03/10/2022   Lab  Results  Component Value Date   ALT 19 03/10/2022   AST 21 03/10/2022   ALKPHOS 64 03/10/2022   BILITOT 0.4 03/10/2022   Lab Results  Component Value Date   HGBA1C 4.9 03/10/2022   HGBA1C 5.0 01/18/2020   HGBA1C 5.2 06/12/2019   HGBA1C 5.2 09/07/2018   Lab Results  Component Value Date   INSULIN 6.9 03/10/2022   INSULIN 7.0 01/18/2020   INSULIN 6.8 09/07/2018   Lab Results  Component Value Date   TSH 2.19 06/12/2021   Lab Results  Component Value Date   CHOL 204 (H) 03/10/2022   HDL 55 03/10/2022   LDLCALC 134 (H) 03/10/2022   TRIG 86 03/10/2022   CHOLHDL 3.7 03/10/2022   Lab Results  Component Value Date   VD25OH 60.1 03/10/2022   VD25OH 83.36 06/12/2021   VD25OH 69.50 06/11/2020   Lab Results  Component Value Date   WBC 5.9 06/12/2021   HGB 14.0 06/12/2021   HCT 40.0 06/12/2021   MCV 97.5 06/12/2021   PLT 339.0 06/12/2021   No results found for: "IRON", "TIBC", "FERRITIN"  Attestation Statements:   Reviewed by clinician on day of visit: allergies, medications, problem list, medical history, surgical history, family history, social history, and previous encounter notes.  I, Elnora Morrison, RMA am acting as transcriptionist for Coralie Common, MD.  I have reviewed the above documentation for accuracy and completeness, and I agree with the above. -  ***

## 2022-06-15 ENCOUNTER — Encounter: Payer: 59 | Admitting: Family Medicine

## 2022-06-29 ENCOUNTER — Ambulatory Visit (INDEPENDENT_AMBULATORY_CARE_PROVIDER_SITE_OTHER): Payer: 59 | Admitting: Family Medicine

## 2022-07-14 ENCOUNTER — Encounter (INDEPENDENT_AMBULATORY_CARE_PROVIDER_SITE_OTHER): Payer: Self-pay | Admitting: Family Medicine

## 2022-07-14 ENCOUNTER — Ambulatory Visit (INDEPENDENT_AMBULATORY_CARE_PROVIDER_SITE_OTHER): Payer: 59 | Admitting: Family Medicine

## 2022-07-14 VITALS — BP 124/67 | HR 72 | Temp 98.2°F | Ht 64.0 in | Wt 174.0 lb

## 2022-07-14 DIAGNOSIS — G4700 Insomnia, unspecified: Secondary | ICD-10-CM

## 2022-07-14 DIAGNOSIS — E669 Obesity, unspecified: Secondary | ICD-10-CM

## 2022-07-14 DIAGNOSIS — I10 Essential (primary) hypertension: Secondary | ICD-10-CM | POA: Diagnosis not present

## 2022-07-14 DIAGNOSIS — F3289 Other specified depressive episodes: Secondary | ICD-10-CM | POA: Diagnosis not present

## 2022-07-14 DIAGNOSIS — Z6830 Body mass index (BMI) 30.0-30.9, adult: Secondary | ICD-10-CM

## 2022-07-14 MED ORDER — CHLORTHALIDONE 25 MG PO TABS: 12.5000 mg | ORAL_TABLET | Freq: Every day | ORAL | 0 refills | Status: DC

## 2022-07-14 MED ORDER — BUPROPION HCL ER (SR) 200 MG PO TB12: 200.0000 mg | ORAL_TABLET | Freq: Two times a day (BID) | ORAL | 0 refills | Status: DC

## 2022-07-21 NOTE — Progress Notes (Unsigned)
Chief Complaint:   OBESITY Ashlee Swanson is here to discuss her progress with her obesity treatment plan along with follow-up of her obesity related diagnoses. Ashlee Swanson is on keeping a food journal and adhering to recommended goals of 1300-1400 calories and 85+ grams of protein daily and states she is following her eating plan approximately 50% of the time. Ashlee Swanson states she is doing 0 minutes 0 times per week.  Today's visit was #: 32 Starting weight: 194 lbs Starting date: 09/07/2018 Today's weight: 174 lbs Today's date: 07/14/2022 Total lbs lost to date: 20 Total lbs lost since last in-office visit: 0  Interim History: Ashlee Swanson has struggled with weight loss over the last month.  She did more traveling and had increased celebrations and extra temptations.  She has done well with increasing protein intake.  Subjective:   1. Essential hypertension Ashlee Swanson's blood pressure is well controlled.  No side effects were noted with chlorthalidone.  2. Insomnia, unspecified type Ashlee Swanson struggles with chronic insomnia.  She has taking Benadryl, Lunesta, and melatonin in the past.  3. Other depression with emotional eating  Ashlee Swanson continues to work on decreasing emotional eating behaviors, no side effects were noted.  Assessment/Plan:   1. Essential hypertension Ashlee Swanson will continue chlorthalidone 25 mg 1/2 tablet daily, and we will refill for 1 month.  - chlorthalidone (HYGROTON) 25 MG tablet; Take 0.5 tablets (12.5 mg total) by mouth daily.  Dispense: 15 tablet; Refill: 0  2. Insomnia, unspecified type The importance of sleep for weight loss as well as health was discussed in depth with the patient today.  Ashlee Swanson is okay to take low-dose Benadryl as needed.  3. Other depression with emotional eating  Ashlee Swanson will continue Wellbutrin SR 200 mg twice daily, and we will refill for 1 month.  - buPROPion (WELLBUTRIN SR) 200 MG 12 hr tablet; Take 1 tablet (200 mg total) by mouth 2 (two) times  daily.  Dispense: 60 tablet; Refill: 0  4. Obesity, Current BMI 30.0 Ashlee Swanson is currently in the action stage of change. As such, her goal is to continue with weight loss efforts. She has agreed to the Category 2 Plan or keeping a food journal and adhering to recommended goals of 1300-1400 calories and 85+ grams of protein daily.   Behavioral modification strategies: increasing lean protein intake.  Hajira has agreed to follow-up with our clinic in 4 weeks. She was informed of the importance of frequent follow-up visits to maximize her success with intensive lifestyle modifications for her multiple health conditions.   Objective:   Blood pressure 124/67, pulse 72, temperature 98.2 F (36.8 C), height '5\' 4"'$  (1.626 m), weight 174 lb (78.9 kg), SpO2 98 %. Body mass index is 29.87 kg/m.  General: Cooperative, alert, well developed, in no acute distress. HEENT: Conjunctivae and lids unremarkable. Cardiovascular: Regular rhythm.  Lungs: Normal work of breathing. Neurologic: No focal deficits.   Lab Results  Component Value Date   CREATININE 1.07 (H) 03/10/2022   BUN 13 03/10/2022   NA 139 03/10/2022   K 4.2 03/10/2022   CL 100 03/10/2022   CO2 25 03/10/2022   Lab Results  Component Value Date   ALT 19 03/10/2022   AST 21 03/10/2022   ALKPHOS 64 03/10/2022   BILITOT 0.4 03/10/2022   Lab Results  Component Value Date   HGBA1C 4.9 03/10/2022   HGBA1C 5.0 01/18/2020   HGBA1C 5.2 06/12/2019   HGBA1C 5.2 09/07/2018   Lab Results  Component Value Date  INSULIN 6.9 03/10/2022   INSULIN 7.0 01/18/2020   INSULIN 6.8 09/07/2018   Lab Results  Component Value Date   TSH 2.19 06/12/2021   Lab Results  Component Value Date   CHOL 204 (H) 03/10/2022   HDL 55 03/10/2022   LDLCALC 134 (H) 03/10/2022   TRIG 86 03/10/2022   CHOLHDL 3.7 03/10/2022   Lab Results  Component Value Date   VD25OH 60.1 03/10/2022   VD25OH 83.36 06/12/2021   VD25OH 69.50 06/11/2020   Lab Results   Component Value Date   WBC 5.9 06/12/2021   HGB 14.0 06/12/2021   HCT 40.0 06/12/2021   MCV 97.5 06/12/2021   PLT 339.0 06/12/2021   No results found for: "IRON", "TIBC", "FERRITIN"  Attestation Statements:   Reviewed by clinician on day of visit: allergies, medications, problem list, medical history, surgical history, family history, social history, and previous encounter notes.   I, Trixie Dredge, am acting as transcriptionist for Dennard Nip, MD.  I have reviewed the above documentation for accuracy and completeness, and I agree with the above. -  Dennard Nip, MD

## 2022-07-29 ENCOUNTER — Encounter (INDEPENDENT_AMBULATORY_CARE_PROVIDER_SITE_OTHER): Payer: Self-pay

## 2022-08-11 ENCOUNTER — Encounter (INDEPENDENT_AMBULATORY_CARE_PROVIDER_SITE_OTHER): Payer: Self-pay | Admitting: Family Medicine

## 2022-08-11 ENCOUNTER — Ambulatory Visit (INDEPENDENT_AMBULATORY_CARE_PROVIDER_SITE_OTHER): Payer: 59 | Admitting: Family Medicine

## 2022-08-11 VITALS — BP 124/81 | HR 91 | Temp 98.1°F | Ht 64.0 in | Wt 170.0 lb

## 2022-08-11 DIAGNOSIS — I10 Essential (primary) hypertension: Secondary | ICD-10-CM

## 2022-08-11 DIAGNOSIS — E669 Obesity, unspecified: Secondary | ICD-10-CM | POA: Diagnosis not present

## 2022-08-11 DIAGNOSIS — Z6829 Body mass index (BMI) 29.0-29.9, adult: Secondary | ICD-10-CM | POA: Diagnosis not present

## 2022-08-11 DIAGNOSIS — F3289 Other specified depressive episodes: Secondary | ICD-10-CM | POA: Diagnosis not present

## 2022-08-11 MED ORDER — BUPROPION HCL ER (SR) 200 MG PO TB12: 200.0000 mg | ORAL_TABLET | Freq: Two times a day (BID) | ORAL | 0 refills | Status: DC

## 2022-08-11 MED ORDER — CHLORTHALIDONE 25 MG PO TABS: 12.5000 mg | ORAL_TABLET | Freq: Every day | ORAL | 0 refills | Status: DC

## 2022-08-19 NOTE — Progress Notes (Unsigned)
Chief Complaint:   OBESITY Ashlee Swanson is here to discuss her progress with her obesity treatment plan along with follow-up of her obesity related diagnoses. Ashlee Swanson is on the Category 2 Plan or keeping a food journal and adhering to recommended goals of 1300-1400 calories and 85+ grams of protein daily and states she is following her eating plan approximately 70% of the time. Ashlee Swanson states she is doing yoga and bar class for 45 minutes 3 times per week.  Today's visit was #: 1 Starting weight: 194 lbs Starting date: 09/07/2018 Today's weight: 170 lbs Today's date: 08/11/2022 Total lbs lost to date: 24 Total lbs lost since last in-office visit: 4  Interim History: Ashlee Swanson continues to do well with weight loss. Her hunger is mostly controlled an she has been more active. She is exercising regularly, but this will change soon with her son back in school next week.   Subjective:   1. Essential hypertension Ashlee Swanson's blood pressure is stable on her medications, and her labs were within normal limits. She has done well with her diet and exercise.   2. Other depression with emotional eating  Ashlee Swanson is working on her diet and exercise and she is doing well on her medications with no side effects noted. She has done well with decreasing emotional eating behaviors.   Assessment/Plan:   1. Essential hypertension Ashlee Swanson will continue chorthalidone 25 mg 1/2 tablet daily, and we will refill for 1 month.  - chlorthalidone (HYGROTON) 25 MG tablet; Take 0.5 tablets (12.5 mg total) by mouth daily.  Dispense: 15 tablet; Refill: 0  2. Other depression with emotional eating  Ashlee Swanson will continue Wellbutrin SR 200 mg BID, and we will refill for 1 month.  - buPROPion (WELLBUTRIN SR) 200 MG 12 hr tablet; Take 1 tablet (200 mg total) by mouth 2 (two) times daily.  Dispense: 60 tablet; Refill: 0  3. Obesity, Current BMI 29.2 Ashlee Swanson is currently in the action stage of change. As such, her goal is to  continue with weight loss efforts. She has agreed to keeping a food journal and adhering to recommended goals of 1300-1400 calories and 85 grams of protein daily.   Exercise goals: As is.   Behavioral modification strategies: increasing lean protein intake.  Ashlee Swanson has agreed to follow-up with our clinic in 4 weeks. She was informed of the importance of frequent follow-up visits to maximize her success with intensive lifestyle modifications for her multiple health conditions.   Objective:   Blood pressure 124/81, pulse 91, temperature 98.1 F (36.7 C), height '5\' 4"'$  (1.626 m), weight 170 lb (77.1 kg), SpO2 98 %. Body mass index is 29.18 kg/m.  General: Cooperative, alert, well developed, in no acute distress. HEENT: Conjunctivae and lids unremarkable. Cardiovascular: Regular rhythm.  Lungs: Normal work of breathing. Neurologic: No focal deficits.   Lab Results  Component Value Date   CREATININE 1.07 (H) 03/10/2022   BUN 13 03/10/2022   NA 139 03/10/2022   K 4.2 03/10/2022   CL 100 03/10/2022   CO2 25 03/10/2022   Lab Results  Component Value Date   ALT 19 03/10/2022   AST 21 03/10/2022   ALKPHOS 64 03/10/2022   BILITOT 0.4 03/10/2022   Lab Results  Component Value Date   HGBA1C 4.9 03/10/2022   HGBA1C 5.0 01/18/2020   HGBA1C 5.2 06/12/2019   HGBA1C 5.2 09/07/2018   Lab Results  Component Value Date   INSULIN 6.9 03/10/2022   INSULIN 7.0 01/18/2020  INSULIN 6.8 09/07/2018   Lab Results  Component Value Date   TSH 2.19 06/12/2021   Lab Results  Component Value Date   CHOL 204 (H) 03/10/2022   HDL 55 03/10/2022   LDLCALC 134 (H) 03/10/2022   TRIG 86 03/10/2022   CHOLHDL 3.7 03/10/2022   Lab Results  Component Value Date   VD25OH 60.1 03/10/2022   VD25OH 83.36 06/12/2021   VD25OH 69.50 06/11/2020   Lab Results  Component Value Date   WBC 5.9 06/12/2021   HGB 14.0 06/12/2021   HCT 40.0 06/12/2021   MCV 97.5 06/12/2021   PLT 339.0 06/12/2021   No  results found for: "IRON", "TIBC", "FERRITIN"  Attestation Statements:   Reviewed by clinician on day of visit: allergies, medications, problem list, medical history, surgical history, family history, social history, and previous encounter notes.   I, Trixie Dredge, am acting as transcriptionist for Dennard Nip, MD.  I have reviewed the above documentation for accuracy and completeness, and I agree with the above. -  ***

## 2022-09-08 ENCOUNTER — Ambulatory Visit (INDEPENDENT_AMBULATORY_CARE_PROVIDER_SITE_OTHER): Payer: 59 | Admitting: Bariatrics

## 2022-09-08 ENCOUNTER — Encounter: Payer: Self-pay | Admitting: Bariatrics

## 2022-09-08 VITALS — BP 127/82 | HR 80 | Temp 98.0°F | Ht 64.0 in | Wt 171.0 lb

## 2022-09-08 DIAGNOSIS — Z6829 Body mass index (BMI) 29.0-29.9, adult: Secondary | ICD-10-CM

## 2022-09-08 DIAGNOSIS — I10 Essential (primary) hypertension: Secondary | ICD-10-CM

## 2022-09-08 DIAGNOSIS — R7303 Prediabetes: Secondary | ICD-10-CM | POA: Diagnosis not present

## 2022-09-08 DIAGNOSIS — E669 Obesity, unspecified: Secondary | ICD-10-CM

## 2022-09-08 DIAGNOSIS — G4709 Other insomnia: Secondary | ICD-10-CM

## 2022-09-08 MED ORDER — DOXEPIN HCL 150 MG PO CAPS
150.0000 mg | ORAL_CAPSULE | Freq: Every day | ORAL | 0 refills | Status: DC
Start: 2022-09-08 — End: 2022-10-06

## 2022-09-09 NOTE — Progress Notes (Unsigned)
Chief Complaint:   OBESITY Ashlee Swanson is here to discuss her progress with her obesity treatment plan along with follow-up of her obesity related diagnoses. Ashlee Swanson is on the Category 2 Plan and keeping a food journal and adhering to recommended goals of 1300-1400 calories and 85 gms protein daily and states she is following her eating plan approximately 75% of the time. Ashlee Swanson states she is doing yoga for 45 minutes 2 times per week.  Today's visit was #: 34 Starting weight: 194 lbs Starting date: 09/07/2018 Today's weight: 171 lbs Today's date: 09/08/22 Total lbs lost to date: 23 Total lbs lost since last in-office visit: +1  Interim History: She is up 1 additional pound since her last visit.  She has an annual physical next week.  Subjective:   1. Pre-diabetes No medications.  2. Essential hypertension Controlled.  3. Other insomnia She has trouble going to sleep.  Has been taking Benadryl (increased weight gain)? Also taking melatonin.  Assessment/Plan:   1. Pre-diabetes 1.  Will keep all carbohydrates low (sugar and starches).  2. Essential hypertension 1.  No added salt. 2.  Continue medications.  3. Other insomnia 1.  Will take melatonin about 3 to 4 hours before she wants to go to sleep. 2.  Prescription-- doxepin (SINEQUAN) 150 MG capsule; Take 1 capsule (150 mg total) by mouth at bedtime.  Dispense: 30 capsule; Refill: 0  4. Obesity, Current BMI 29.4 1.  Will adhere closely to the plan 80 to 90%.  Ashlee Swanson is currently in the action stage of change. As such, her goal is to continue with weight loss efforts. She has agreed to the Category 2 Plan and keeping a food journal and adhering to recommended goals of 1300-1400 calories and 85 gms protein -alternate plans.  Exercise goals: Yoga, considering more exercise options for the fall.  Behavioral modification strategies: increasing lean protein intake, decreasing simple carbohydrates, increasing vegetables,  increasing water intake, decreasing eating out, no skipping meals, meal planning and cooking strategies, keeping healthy foods in the home, and planning for success.  Ashlee Swanson has agreed to follow-up with our clinic in 4 weeks. She was informed of the importance of frequent follow-up visits to maximize her success with intensive lifestyle modifications for her multiple health conditions.   Objective:   Blood pressure 127/82, pulse 80, temperature 98 F (36.7 C), height '5\' 4"'$  (1.626 m), weight 171 lb (77.6 kg), SpO2 98 %. Body mass index is 29.35 kg/m.  General: Cooperative, alert, well developed, in no acute distress. HEENT: Conjunctivae and lids unremarkable. Cardiovascular: Regular rhythm.  Lungs: Normal work of breathing. Neurologic: No focal deficits.   Lab Results  Component Value Date   CREATININE 1.07 (H) 03/10/2022   BUN 13 03/10/2022   NA 139 03/10/2022   K 4.2 03/10/2022   CL 100 03/10/2022   CO2 25 03/10/2022   Lab Results  Component Value Date   ALT 19 03/10/2022   AST 21 03/10/2022   ALKPHOS 64 03/10/2022   BILITOT 0.4 03/10/2022   Lab Results  Component Value Date   HGBA1C 4.9 03/10/2022   HGBA1C 5.0 01/18/2020   HGBA1C 5.2 06/12/2019   HGBA1C 5.2 09/07/2018   Lab Results  Component Value Date   INSULIN 6.9 03/10/2022   INSULIN 7.0 01/18/2020   INSULIN 6.8 09/07/2018   Lab Results  Component Value Date   TSH 2.19 06/12/2021   Lab Results  Component Value Date   CHOL 204 (H) 03/10/2022  HDL 55 03/10/2022   LDLCALC 134 (H) 03/10/2022   TRIG 86 03/10/2022   CHOLHDL 3.7 03/10/2022   Lab Results  Component Value Date   VD25OH 60.1 03/10/2022   VD25OH 83.36 06/12/2021   VD25OH 69.50 06/11/2020   Lab Results  Component Value Date   WBC 5.9 06/12/2021   HGB 14.0 06/12/2021   HCT 40.0 06/12/2021   MCV 97.5 06/12/2021   PLT 339.0 06/12/2021   No results found for: "IRON", "TIBC", "FERRITIN"   Attestation Statements:   Reviewed by  clinician on day of visit: allergies, medications, problem list, medical history, surgical history, family history, social history, and previous encounter notes.  I, Dawn Whitmire, FNP-C, am acting as transcriptionist for Dr. Jearld Lesch.  I have reviewed the above documentation for accuracy and completeness, and I agree with the above. Jearld Lesch, DO

## 2022-09-10 ENCOUNTER — Encounter: Payer: Self-pay | Admitting: Bariatrics

## 2022-09-15 ENCOUNTER — Ambulatory Visit (INDEPENDENT_AMBULATORY_CARE_PROVIDER_SITE_OTHER): Payer: 59 | Admitting: Family Medicine

## 2022-09-15 ENCOUNTER — Encounter: Payer: Self-pay | Admitting: Family Medicine

## 2022-09-15 VITALS — BP 120/70 | HR 80 | Temp 97.7°F | Resp 18 | Ht 64.0 in | Wt 176.8 lb

## 2022-09-15 DIAGNOSIS — Z0000 Encounter for general adult medical examination without abnormal findings: Secondary | ICD-10-CM | POA: Diagnosis not present

## 2022-09-15 DIAGNOSIS — E559 Vitamin D deficiency, unspecified: Secondary | ICD-10-CM | POA: Diagnosis not present

## 2022-09-15 DIAGNOSIS — Z23 Encounter for immunization: Secondary | ICD-10-CM | POA: Diagnosis not present

## 2022-09-15 DIAGNOSIS — E7849 Other hyperlipidemia: Secondary | ICD-10-CM | POA: Diagnosis not present

## 2022-09-15 DIAGNOSIS — I10 Essential (primary) hypertension: Secondary | ICD-10-CM

## 2022-09-15 DIAGNOSIS — J4521 Mild intermittent asthma with (acute) exacerbation: Secondary | ICD-10-CM

## 2022-09-15 LAB — COMPREHENSIVE METABOLIC PANEL
ALT: 11 U/L (ref 0–35)
AST: 13 U/L (ref 0–37)
Albumin: 4.2 g/dL (ref 3.5–5.2)
Alkaline Phosphatase: 59 U/L (ref 39–117)
BUN: 20 mg/dL (ref 6–23)
CO2: 30 mEq/L (ref 19–32)
Calcium: 9.7 mg/dL (ref 8.4–10.5)
Chloride: 103 mEq/L (ref 96–112)
Creatinine, Ser: 1.01 mg/dL (ref 0.40–1.20)
GFR: 64.29 mL/min (ref >60.00)
Glucose, Bld: 77 mg/dL (ref 70–99)
Potassium: 4.1 mEq/L (ref 3.5–5.1)
Sodium: 140 mEq/L (ref 135–145)
Total Bilirubin: 0.4 mg/dL (ref 0.2–1.2)
Total Protein: 7 g/dL (ref 6.0–8.3)

## 2022-09-15 LAB — LIPID PANEL
Cholesterol: 178 mg/dL (ref 0–200)
HDL: 53 mg/dL (ref >39.00)
LDL Cholesterol: 103 mg/dL — ABNORMAL HIGH (ref 0–99)
NonHDL: 124.83
Total CHOL/HDL Ratio: 3
Triglycerides: 111 mg/dL (ref 0.0–149.0)
VLDL: 22.2 mg/dL (ref 0.0–40.0)

## 2022-09-15 LAB — CBC WITH DIFFERENTIAL/PLATELET
Basophils Absolute: 0 10*3/uL (ref 0.0–0.1)
Basophils Relative: 0.6 % (ref 0.0–3.0)
Eosinophils Absolute: 0.1 10*3/uL (ref 0.0–0.7)
Eosinophils Relative: 1.1 % (ref 0.0–5.0)
HCT: 41 % (ref 36.0–46.0)
Hemoglobin: 14.3 g/dL (ref 12.0–15.0)
Lymphocytes Relative: 44.9 % (ref 12.0–46.0)
Lymphs Abs: 3.3 10*3/uL (ref 0.7–4.0)
MCHC: 34.8 g/dL (ref 30.0–36.0)
MCV: 97.4 fl (ref 78.0–100.0)
Monocytes Absolute: 0.4 10*3/uL (ref 0.1–1.0)
Monocytes Relative: 6 % (ref 3.0–12.0)
Neutro Abs: 3.5 10*3/uL (ref 1.4–7.7)
Neutrophils Relative %: 47.4 % (ref 43.0–77.0)
Platelets: 321 10*3/uL (ref 150.0–400.0)
RBC: 4.21 Mil/uL (ref 3.87–5.11)
RDW: 11.8 % (ref 11.5–15.5)
WBC: 7.5 10*3/uL (ref 4.0–10.5)

## 2022-09-15 LAB — TSH: TSH: 1.87 u[IU]/mL (ref 0.35–5.50)

## 2022-09-15 LAB — VITAMIN D 25 HYDROXY (VIT D DEFICIENCY, FRACTURES): VITD: 76.8 ng/mL (ref 30.00–100.00)

## 2022-09-15 MED ORDER — ALBUTEROL SULFATE HFA 108 (90 BASE) MCG/ACT IN AERS: 2.0000 | INHALATION_SPRAY | Freq: Four times a day (QID) | RESPIRATORY_TRACT | 2 refills | Status: DC | PRN

## 2022-09-15 MED ORDER — IPRATROPIUM-ALBUTEROL 0.5-2.5 (3) MG/3ML IN SOLN: 3.0000 mL | Freq: Four times a day (QID) | RESPIRATORY_TRACT | 1 refills | Status: DC | PRN

## 2022-09-15 MED ORDER — PREDNISONE 10 MG PO TABS: ORAL_TABLET | ORAL | 0 refills | Status: DC

## 2022-09-15 NOTE — Progress Notes (Unsigned)
Subjective:     Ashlee Swanson is a 51 y.o. female and is here for a comprehensive physical exam. The patient reports {problems:16946}.  Social History   Socioeconomic History   Marital status: Married    Spouse name: Not on file   Number of children: Not on file   Years of education: Not on file   Highest education level: Not on file  Occupational History    Comment: [employer]  Tobacco Use   Smoking status: Former    Packs/day: 0.25    Years: 10.00    Total pack years: 2.50    Types: Cigarettes    Quit date: 05/29/2004    Years since quitting: 18.3   Smokeless tobacco: Never  Vaping Use   Vaping Use: Never used  Substance and Sexual Activity   Alcohol use: Yes    Alcohol/week: 0.0 standard drinks of alcohol    Comment: socially   Drug use: No   Sexual activity: Yes    Partners: Male  Other Topics Concern   Not on file  Social History Narrative   Exercise- 4 days a week   Social Determinants of Health   Financial Resource Strain: Not on file  Food Insecurity: Not on file  Transportation Needs: Not on file  Physical Activity: Not on file  Stress: Not on file  Social Connections: Not on file  Intimate Partner Violence: Not on file   Health Maintenance  Topic Date Due   Zoster Vaccines- Shingrix (1 of 2) Never done   PAP SMEAR-Modifier  11/08/2020   COVID-19 Vaccine (5 - Pfizer series) 08/07/2021   INFLUENZA VACCINE  07/21/2022   MAMMOGRAM  12/30/2022   COLONOSCOPY (Pts 45-29yr Insurance coverage will need to be confirmed)  10/06/2026   TETANUS/TDAP  12/11/2029   Hepatitis C Screening  Completed   HIV Screening  Completed   HPV VACCINES  Aged Out    {Common ambulatory SmartLinks:19316}  Review of Systems {ros; complete:30496}   Objective:    {Exam, [phone]}    Assessment:    Healthy female exam. ***     Plan:     See After Visit Summary for Counseling Recommendations

## 2022-09-15 NOTE — Patient Instructions (Signed)
Preventive Care 40-52 Years Old, Female Preventive care refers to lifestyle choices and visits with your health care provider that can promote health and wellness. Preventive care visits are also called wellness exams. What can I expect for my preventive care visit? Counseling Your health care provider may ask you questions about your: Medical history, including: Past medical problems. Family medical history. Pregnancy history. Current health, including: Menstrual cycle. Method of birth control. Emotional well-being. Home life and relationship well-being. Sexual activity and sexual health. Lifestyle, including: Alcohol, nicotine or tobacco, and drug use. Access to firearms. Diet, exercise, and sleep habits. Work and work environment. Sunscreen use. Safety issues such as seatbelt and bike helmet use. Physical exam Your health care provider will check your: Height and weight. These may be used to calculate your BMI (body mass index). BMI is a measurement that tells if you are at a healthy weight. Waist circumference. This measures the distance around your waistline. This measurement also tells if you are at a healthy weight and may help predict your risk of certain diseases, such as type 2 diabetes and high blood pressure. Heart rate and blood pressure. Body temperature. Skin for abnormal spots. What immunizations do I need?  Vaccines are usually given at various ages, according to a schedule. Your health care provider will recommend vaccines for you based on your age, medical history, and lifestyle or other factors, such as travel or where you work. What tests do I need? Screening Your health care provider may recommend screening tests for certain conditions. This may include: Lipid and cholesterol levels. Diabetes screening. This is done by checking your blood sugar (glucose) after you have not eaten for a while (fasting). Pelvic exam and Pap test. Hepatitis B test. Hepatitis C  test. HIV (human immunodeficiency virus) test. STI (sexually transmitted infection) testing, if you are at risk. Lung cancer screening. Colorectal cancer screening. Mammogram. Talk with your health care provider about when you should start having regular mammograms. This may depend on whether you have a family history of breast cancer. BRCA-related cancer screening. This may be done if you have a family history of breast, ovarian, tubal, or peritoneal cancers. Bone density scan. This is done to screen for osteoporosis. Talk with your health care provider about your test results, treatment options, and if necessary, the need for more tests. Follow these instructions at home: Eating and drinking  Eat a diet that includes fresh fruits and vegetables, whole grains, lean protein, and low-fat dairy products. Take vitamin and mineral supplements as recommended by your health care provider. Do not drink alcohol if: Your health care provider tells you not to drink. You are pregnant, may be pregnant, or are planning to become pregnant. If you drink alcohol: Limit how much you have to 0-1 drink a day. Know how much alcohol is in your drink. In the [place] one drink equals one 12 oz bottle of beer (355 mL), one 5 oz glass of wine (148 mL), or one 1 oz glass of hard liquor (44 mL). Lifestyle Brush your teeth every morning and night with fluoride toothpaste. Floss one time each day. Exercise for at least 30 minutes 5 or more days each week. Do not use any products that contain nicotine or tobacco. These products include cigarettes, chewing tobacco, and vaping devices, such as e-cigarettes. If you need help quitting, ask your health care provider. Do not use drugs. If you are sexually active, practice safe sex. Use a condom or other form of protection to   prevent STIs. If you do not wish to become pregnant, use a form of birth control. If you plan to become pregnant, see your health care provider for a  prepregnancy visit. Take aspirin only as told by your health care provider. Make sure that you understand how much to take and what form to take. Work with your health care provider to find out whether it is safe and beneficial for you to take aspirin daily. Find healthy ways to manage stress, such as: Meditation, yoga, or listening to music. Journaling. Talking to a trusted person. Spending time with friends and family. Minimize exposure to UV radiation to reduce your risk of skin cancer. Safety Always wear your seat belt while driving or riding in a vehicle. Do not drive: If you have been drinking alcohol. Do not ride with someone who has been drinking. When you are tired or distracted. While texting. If you have been using any mind-altering substances or drugs. Wear a helmet and other protective equipment during sports activities. If you have firearms in your house, make sure you follow all gun safety procedures. Seek help if you have been physically or sexually abused. What's next? Visit your health care provider once a year for an annual wellness visit. Ask your health care provider how often you should have your eyes and teeth checked. Stay up to date on all vaccines. This information is not intended to replace advice given to you by your health care provider. Make sure you discuss any questions you have with your health care provider. Document Revised: 06/04/2021 Document Reviewed: 06/04/2021 Elsevier Patient Education  [place].

## 2022-09-16 NOTE — Assessment & Plan Note (Signed)
Encourage heart healthy diet such as MIND or DASH diet, increase exercise, avoid trans fats, simple carbohydrates and processed foods, consider a krill or fish or flaxseed oil cap daily.  °

## 2022-09-16 NOTE — Assessment & Plan Note (Signed)
Check labs 

## 2022-09-16 NOTE — Assessment & Plan Note (Signed)
ghm utd Check labs  

## 2022-09-16 NOTE — Assessment & Plan Note (Signed)
Well controlled, no changes to meds. Encouraged heart healthy diet such as the DASH diet and exercise as tolerated.  °

## 2022-10-06 ENCOUNTER — Encounter: Payer: Self-pay | Admitting: Bariatrics

## 2022-10-06 ENCOUNTER — Ambulatory Visit (INDEPENDENT_AMBULATORY_CARE_PROVIDER_SITE_OTHER): Payer: 59 | Admitting: Bariatrics

## 2022-10-06 VITALS — BP 127/84 | HR 80 | Temp 98.0°F | Ht 64.0 in | Wt 173.0 lb

## 2022-10-06 DIAGNOSIS — I10 Essential (primary) hypertension: Secondary | ICD-10-CM

## 2022-10-06 DIAGNOSIS — G4709 Other insomnia: Secondary | ICD-10-CM | POA: Insufficient documentation

## 2022-10-06 DIAGNOSIS — R7303 Prediabetes: Secondary | ICD-10-CM

## 2022-10-06 DIAGNOSIS — R632 Polyphagia: Secondary | ICD-10-CM

## 2022-10-06 DIAGNOSIS — Z6829 Body mass index (BMI) 29.0-29.9, adult: Secondary | ICD-10-CM

## 2022-10-06 DIAGNOSIS — E669 Obesity, unspecified: Secondary | ICD-10-CM

## 2022-10-06 MED ORDER — RAMELTEON 8 MG PO TABS: 8.0000 mg | ORAL_TABLET | Freq: Every day | ORAL | 0 refills | Status: DC

## 2022-10-06 MED ORDER — CHLORTHALIDONE 25 MG PO TABS: 12.5000 mg | ORAL_TABLET | Freq: Every day | ORAL | 0 refills | Status: DC

## 2022-10-12 ENCOUNTER — Encounter (INDEPENDENT_AMBULATORY_CARE_PROVIDER_SITE_OTHER): Payer: Self-pay

## 2022-10-12 ENCOUNTER — Telehealth (INDEPENDENT_AMBULATORY_CARE_PROVIDER_SITE_OTHER): Payer: Self-pay | Admitting: Bariatrics

## 2022-10-12 NOTE — Progress Notes (Signed)
Chief Complaint:   OBESITY Ashlee Swanson is here to discuss her progress with her obesity treatment plan along with follow-up of her obesity related diagnoses. Ashlee Swanson is on the Category 2 Plan and keeping a food journal and adhering to recommended goals of 1300-1400 calories and 85 grams of protein daily and states she is following her eating plan approximately 65% of the time. Ashlee Swanson states she is doing yoga and swimming for 45 minutes 1-2 times per week.  Today's visit was #: 20 Starting weight: 194 lbs Starting date: 09/07/2018 Today's weight: 173 lbs Today's date: 10/06/2022 Total lbs lost to date: 21 Total lbs lost since last in-office visit: 0  Interim History: Ashlee Swanson is up 2 lbs since her last visit, but she has done well overall. She is doing ok with getting her protein.   Subjective:   1. Essential hypertension Ashlee Swanson's blood pressure is controlled.   2. Pre-diabetes Ashlee Swanson is not on medications currently.   3. Polyphagia Ashlee Swanson notes increase polyphagia after swimming.   4. Other insomnia Ashlee Swanson notes decreased cravings. She is taking Doxepin, and she notes trouble weaning off and hard to wake up.   Assessment/Plan:   1. Essential hypertension Ashlee Swanson will continue chlorthalidone 12.5 mg daily, and we will refill for 1 month.   - chlorthalidone (HYGROTON) 25 MG tablet; Take 0.5 tablets (12.5 mg total) by mouth daily.  Dispense: 15 tablet; Refill: 0  2. Pre-diabetes Ashlee Swanson will keep all carbohydrates low (sugar and starches).   Ashlee Swanson will add in a protein shake (handout was given)  4. Other insomnia Ashlee Swanson agreed to discontinue Doxepin, and will start Rozerem mg qhs with no refills.   - ramelteon (ROZEREM) 8 MG tablet; Take 1 tablet (8 mg total) by mouth at bedtime.  Dispense: 30 tablet; Refill: 0  5. Obesity, Current BMI 29.7 Ashlee Swanson is currently in the action stage of change. As such, her goal is to continue with weight loss efforts. She has  agreed to the Category 2 Plan and keeping a food journal and adhering to recommended goals of 1300-1400 calories and 85-90 grams of protein.   Meal planning and mindful eating were discussed. Increase water to 8 oz in the AM.    Exercise goals: As is.   Behavioral modification strategies: increasing lean protein intake, decreasing simple carbohydrates, increasing vegetables, increasing water intake, decreasing eating out, no skipping meals, meal planning and cooking strategies, keeping healthy foods in the home, and planning for success.  Ashlee Swanson has agreed to follow-up with our clinic in 4 weeks. She was informed of the importance of frequent follow-up visits to maximize her success with intensive lifestyle modifications for her multiple health conditions.   Objective:   Blood pressure 127/84, pulse 80, temperature 98 F (36.7 C), height '5\' 4"'$  (1.626 m), weight 173 lb (78.5 kg), SpO2 99 %. Body mass index is 29.7 kg/m.  General: Cooperative, alert, well developed, in no acute distress. HEENT: Conjunctivae and lids unremarkable. Cardiovascular: Regular rhythm.  Lungs: Normal work of breathing. Neurologic: No focal deficits.   Lab Results  Component Value Date   CREATININE 1.01 09/15/2022   BUN 20 09/15/2022   NA 140 09/15/2022   K 4.1 09/15/2022   CL 103 09/15/2022   CO2 30 09/15/2022   Lab Results  Component Value Date   ALT 11 09/15/2022   AST 13 09/15/2022   ALKPHOS 59 09/15/2022   BILITOT 0.4 09/15/2022   Lab Results  Component Value Date  HGBA1C 4.9 03/10/2022   HGBA1C 5.0 01/18/2020   HGBA1C 5.2 06/12/2019   HGBA1C 5.2 09/07/2018   Lab Results  Component Value Date   INSULIN 6.9 03/10/2022   INSULIN 7.0 01/18/2020   INSULIN 6.8 09/07/2018   Lab Results  Component Value Date   TSH 1.87 09/15/2022   Lab Results  Component Value Date   CHOL 178 09/15/2022   HDL 53.00 09/15/2022   LDLCALC 103 (H) 09/15/2022   TRIG 111.0 09/15/2022   CHOLHDL 3 09/15/2022    Lab Results  Component Value Date   VD25OH 76.80 09/15/2022   VD25OH 60.1 03/10/2022   VD25OH 83.36 06/12/2021   Lab Results  Component Value Date   WBC 7.5 09/15/2022   HGB 14.3 09/15/2022   HCT 41.0 09/15/2022   MCV 97.4 09/15/2022   PLT 321.0 09/15/2022   No results found for: "IRON", "TIBC", "FERRITIN"  Attestation Statements:   Reviewed by clinician on day of visit: allergies, medications, problem list, medical history, surgical history, family history, social history, and previous encounter notes.   Wilhemena Durie, am acting as Location manager for [employer], DO.  I have reviewed the above documentation for accuracy and completeness, and I agree with the above. Jearld Lesch, DO

## 2022-10-12 NOTE — Telephone Encounter (Signed)
Dr. Owens Shark - Prior authorization denied for Ramelteon. Per insurance: This medicine is covered only if: You have a history of trial and failure of at least two weeks, contraindication, or intolerance to one of the following sedative-hypnotic alternatives: (A) Zolpidem (generic Ambien). (B) Zaleplon (generic Sonata). Patient sent denial message via mychart.

## 2022-10-13 ENCOUNTER — Encounter: Payer: Self-pay | Admitting: Bariatrics

## 2022-11-03 ENCOUNTER — Encounter: Payer: Self-pay | Admitting: Bariatrics

## 2022-11-03 ENCOUNTER — Ambulatory Visit (INDEPENDENT_AMBULATORY_CARE_PROVIDER_SITE_OTHER): Payer: 59 | Admitting: Bariatrics

## 2022-11-03 VITALS — BP 112/77 | HR 73 | Temp 97.9°F | Ht 64.0 in | Wt 173.0 lb

## 2022-11-03 DIAGNOSIS — Z6829 Body mass index (BMI) 29.0-29.9, adult: Secondary | ICD-10-CM

## 2022-11-03 DIAGNOSIS — E669 Obesity, unspecified: Secondary | ICD-10-CM | POA: Diagnosis not present

## 2022-11-03 DIAGNOSIS — I10 Essential (primary) hypertension: Secondary | ICD-10-CM | POA: Diagnosis not present

## 2022-11-03 DIAGNOSIS — R7303 Prediabetes: Secondary | ICD-10-CM

## 2022-11-10 ENCOUNTER — Ambulatory Visit: Payer: 59

## 2022-11-10 ENCOUNTER — Ambulatory Visit: Payer: 59 | Admitting: Family Medicine

## 2022-11-16 NOTE — Progress Notes (Unsigned)
     Chief Complaint:   OBESITY Ashlee Swanson is here to discuss her progress with her obesity treatment plan along with follow-up of her obesity related diagnoses. Ashlee Swanson is on {MWMwtlossportion/plan2:23431} and states she is following her eating plan approximately ***% of the time. Ashlee Swanson states she is *** *** minutes *** times per week.  Today's visit was #: *** Starting weight: *** Starting date: *** Today's weight: *** Today's date: 11/03/2022 Total lbs lost to date: *** Total lbs lost since last in-office visit: ***  Interim History: ***  Subjective:   1. Pre-diabetes ***  2. Essential hypertension ***  Assessment/Plan:   1. Pre-diabetes ***  2. Essential hypertension ***  3. Obesity, Current BMI 29.7 Ashlee Swanson {CHL AMB IS/IS NOT:210130109} currently in the action stage of change. As such, her goal is to {MWMwtloss#1:210800005}. She has agreed to {MWMwtlossportion/plan2:23431}.   Exercise goals: {MWM EXERCISE RECS:23473}  Behavioral modification strategies: {MWMwtlossdietstrategies3:23432}.  Brennan has agreed to follow-up with our clinic in {NUMBER 1-10:22536} weeks. She was informed of the importance of frequent follow-up visits to maximize her success with intensive lifestyle modifications for her multiple health conditions.   Objective:   Blood pressure 112/77, pulse 73, temperature 97.9 F (36.6 C), height '5\' 4"'$  (1.626 m), weight 173 lb (78.5 kg), SpO2 97 %. Body mass index is 29.7 kg/m.  General: Cooperative, alert, well developed, in no acute distress. HEENT: Conjunctivae and lids unremarkable. Cardiovascular: Regular rhythm.  Lungs: Normal work of breathing. Neurologic: No focal deficits.   Lab Results  Component Value Date   CREATININE 1.01 09/15/2022   BUN 20 09/15/2022   NA 140 09/15/2022   K 4.1 09/15/2022   CL 103 09/15/2022   CO2 30 09/15/2022   Lab Results  Component Value Date   ALT 11 09/15/2022   AST 13 09/15/2022   ALKPHOS 59  09/15/2022   BILITOT 0.4 09/15/2022   Lab Results  Component Value Date   HGBA1C 4.9 03/10/2022   HGBA1C 5.0 01/18/2020   HGBA1C 5.2 06/12/2019   HGBA1C 5.2 09/07/2018   Lab Results  Component Value Date   INSULIN 6.9 03/10/2022   INSULIN 7.0 01/18/2020   INSULIN 6.8 09/07/2018   Lab Results  Component Value Date   TSH 1.87 09/15/2022   Lab Results  Component Value Date   CHOL 178 09/15/2022   HDL 53.00 09/15/2022   LDLCALC 103 (H) 09/15/2022   TRIG 111.0 09/15/2022   CHOLHDL 3 09/15/2022   Lab Results  Component Value Date   VD25OH 76.80 09/15/2022   VD25OH 60.1 03/10/2022   VD25OH 83.36 06/12/2021   Lab Results  Component Value Date   WBC 7.5 09/15/2022   HGB 14.3 09/15/2022   HCT 41.0 09/15/2022   MCV 97.4 09/15/2022   PLT 321.0 09/15/2022   No results found for: "IRON", "TIBC", "FERRITIN"  Attestation Statements:   Reviewed by clinician on day of visit: allergies, medications, problem list, medical history, surgical history, family history, social history, and previous encounter notes.   Wilhemena Durie, am acting as Location manager for [employer], DO.  I have reviewed the above documentation for accuracy and completeness, and I agree with the above. -  ***

## 2022-11-17 ENCOUNTER — Encounter: Payer: Self-pay | Admitting: Bariatrics

## 2022-11-18 ENCOUNTER — Ambulatory Visit (INDEPENDENT_AMBULATORY_CARE_PROVIDER_SITE_OTHER): Payer: 59

## 2022-11-18 DIAGNOSIS — Z23 Encounter for immunization: Secondary | ICD-10-CM

## 2022-11-18 NOTE — Progress Notes (Signed)
Pt here for second shingles vaccine per Dr. Etter Sjogren. Pt states not having any abnormal side effects from the first vaccine except for not feeling well the day after the vaccine. Pt was offered the vaccine information sheet and declined it.

## 2022-12-03 ENCOUNTER — Encounter: Payer: Self-pay | Admitting: Bariatrics

## 2022-12-03 ENCOUNTER — Ambulatory Visit (INDEPENDENT_AMBULATORY_CARE_PROVIDER_SITE_OTHER): Payer: 59 | Admitting: Bariatrics

## 2022-12-03 VITALS — BP 127/77 | HR 71 | Temp 97.6°F | Ht 60.0 in | Wt 172.0 lb

## 2022-12-03 DIAGNOSIS — E669 Obesity, unspecified: Secondary | ICD-10-CM | POA: Diagnosis not present

## 2022-12-03 DIAGNOSIS — Z6829 Body mass index (BMI) 29.0-29.9, adult: Secondary | ICD-10-CM

## 2022-12-03 DIAGNOSIS — R7303 Prediabetes: Secondary | ICD-10-CM | POA: Diagnosis not present

## 2022-12-03 DIAGNOSIS — G4709 Other insomnia: Secondary | ICD-10-CM | POA: Diagnosis not present

## 2022-12-03 DIAGNOSIS — R632 Polyphagia: Secondary | ICD-10-CM

## 2022-12-03 MED ORDER — ZALEPLON 5 MG PO CAPS: 5.0000 mg | ORAL_CAPSULE | Freq: Every evening | ORAL | 0 refills | Status: DC | PRN

## 2022-12-15 NOTE — Progress Notes (Signed)
Chief Complaint:   OBESITY Ashlee Swanson is here to discuss her progress with her obesity treatment plan along with follow-up of her obesity related diagnoses. Ashlee Swanson is on keeping a food journal Ashlee adhering to recommended goals of 1300-1400 calories Ashlee 85-90 grams of protein Ashlee states she is following her eating plan approximately 70% of the time. Ashlee Swanson states she is doing yoga Ashlee swimming for 45 minutes 2 times per week.  Today's visit was #: 54 Starting weight: 194 lbs Starting date: 09/07/2018 Today's weight: 172 lbs Today's date: 12/03/2022 Total lbs lost to date: 22 Total lbs lost since last in-office visit: 1  Interim History: Ashlee Swanson is down 1 pound since her last visit.  She did not feel like she was on Plavix.  Subjective:   1. Pre-diabetes Ashlee Swanson is not on medications currently.  2. Ashlee Swanson is not on medications currently.  3. Other insomnia Ashlee Swanson has tried Lunesta, doxepin, Ashlee Benadryl.  Assessment/Plan:   1. Pre-diabetes Ashlee Swanson will work on limiting her sugar Ashlee starches.  2. Ashlee Swanson will work on increasing her water, protein, Ashlee fiber intake.  She will keep good snacks in the home.  3. Other insomnia Ashlee Swanson agreed to start Sonata 5 mg nightly with no refills.  We discussed sleep hygiene, Ashlee PDMP was checked with no issues.  - zaleplon (SONATA) 5 MG capsule; Take 1 capsule (5 mg total) by mouth at bedtime as needed for sleep.  Dispense: 30 capsule; Refill: 0  4. Obesity, Current BMI 29.6 Ashlee Swanson is currently in the action stage of change. As such, her goal is to continue with weight loss efforts. She has agreed to keeping a food journal Ashlee adhering to recommended goals of 1300-1400 calories Ashlee 85-90 grams of protein.   She will adhere closely to the plan.  Intentional eating was discussed.  She will not keep unhealthy snacks at home.  Exercise goals: As is.   Behavioral modification strategies: increasing lean protein intake,  decreasing simple carbohydrates, increasing vegetables, increasing water intake, decreasing eating out, no skipping meals, meal planning Ashlee cooking strategies, keeping healthy foods in the home, Ashlee planning for success.  Ashlee Swanson has agreed to follow-up with our clinic in 4 weeks. She was informed of the importance of frequent follow-up visits to maximize her success with intensive lifestyle modifications for her multiple health conditions.   Objective:   Blood pressure 127/77, pulse 71, temperature 97.6 F (36.4 C), height 5' (1.524 m), weight 172 lb (78 kg), SpO2 98 %. Body mass index is 33.59 kg/m.  General: Cooperative, alert, well developed, in no acute distress. HEENT: Conjunctivae Ashlee lids unremarkable. Cardiovascular: Regular rhythm.  Lungs: Normal work of breathing. Neurologic: No focal deficits.   Lab Results  Component Value Date   CREATININE 1.01 09/15/2022   BUN 20 09/15/2022   NA 140 09/15/2022   K 4.1 09/15/2022   CL 103 09/15/2022   CO2 30 09/15/2022   Lab Results  Component Value Date   ALT 11 09/15/2022   AST 13 09/15/2022   ALKPHOS 59 09/15/2022   BILITOT 0.4 09/15/2022   Lab Results  Component Value Date   HGBA1C 4.9 03/10/2022   HGBA1C 5.0 01/18/2020   HGBA1C 5.2 06/12/2019   HGBA1C 5.2 09/07/2018   Lab Results  Component Value Date   INSULIN 6.9 03/10/2022   INSULIN 7.0 01/18/2020   INSULIN 6.8 09/07/2018   Lab Results  Component Value Date   TSH 1.87 09/15/2022   Lab Results  Component Value Date   CHOL 178 09/15/2022   HDL 53.00 09/15/2022   LDLCALC 103 (H) 09/15/2022   TRIG 111.0 09/15/2022   CHOLHDL 3 09/15/2022   Lab Results  Component Value Date   VD25OH 76.80 09/15/2022   VD25OH 60.1 03/10/2022   VD25OH 83.36 06/12/2021   Lab Results  Component Value Date   WBC 7.5 09/15/2022   HGB 14.3 09/15/2022   HCT 41.0 09/15/2022   MCV 97.4 09/15/2022   PLT 321.0 09/15/2022   No results found for: "IRON", "TIBC",  "FERRITIN"  Attestation Statements:   Reviewed by clinician on day of visit: allergies, medications, problem list, medical history, surgical history, family history, social history, Ashlee previous encounter notes.   Wilhemena Durie, am acting as Location manager for [employer], DO.  I have reviewed the above documentation for accuracy Ashlee completeness, Ashlee I agree with the above. Jearld Lesch, DO

## 2022-12-31 ENCOUNTER — Ambulatory Visit: Payer: 59 | Admitting: Bariatrics

## 2023-03-22 LAB — HM MAMMOGRAPHY

## 2023-09-17 ENCOUNTER — Encounter: Payer: 59 | Admitting: Family Medicine

## 2023-09-23 ENCOUNTER — Encounter: Payer: 59 | Admitting: Family Medicine

## 2023-10-01 ENCOUNTER — Encounter: Payer: Self-pay | Admitting: Family Medicine

## 2023-10-01 ENCOUNTER — Ambulatory Visit: Payer: 59 | Admitting: Family Medicine

## 2023-10-01 VITALS — BP 120/80 | HR 96 | Temp 98.5°F | Resp 18 | Ht 60.0 in | Wt 194.2 lb

## 2023-10-01 DIAGNOSIS — Z23 Encounter for immunization: Secondary | ICD-10-CM | POA: Diagnosis not present

## 2023-10-01 DIAGNOSIS — E559 Vitamin D deficiency, unspecified: Secondary | ICD-10-CM

## 2023-10-01 DIAGNOSIS — E7849 Other hyperlipidemia: Secondary | ICD-10-CM

## 2023-10-01 DIAGNOSIS — H9193 Unspecified hearing loss, bilateral: Secondary | ICD-10-CM

## 2023-10-01 DIAGNOSIS — R7303 Prediabetes: Secondary | ICD-10-CM

## 2023-10-01 DIAGNOSIS — Z0000 Encounter for general adult medical examination without abnormal findings: Secondary | ICD-10-CM | POA: Diagnosis not present

## 2023-10-01 DIAGNOSIS — I10 Essential (primary) hypertension: Secondary | ICD-10-CM

## 2023-10-01 DIAGNOSIS — F3289 Other specified depressive episodes: Secondary | ICD-10-CM

## 2023-10-01 LAB — TSH: TSH: 1.74 u[IU]/mL (ref 0.35–5.50)

## 2023-10-01 LAB — VITAMIN D 25 HYDROXY (VIT D DEFICIENCY, FRACTURES): VITD: 37.88 ng/mL (ref 30.00–100.00)

## 2023-10-01 LAB — CBC WITH DIFFERENTIAL/PLATELET
Basophils Absolute: 0 10*3/uL (ref 0.0–0.1)
Basophils Relative: 0.5 % (ref 0.0–3.0)
Eosinophils Absolute: 0.1 10*3/uL (ref 0.0–0.7)
Eosinophils Relative: 1.1 % (ref 0.0–5.0)
HCT: 44 % (ref 36.0–46.0)
Hemoglobin: 14.8 g/dL (ref 12.0–15.0)
Lymphocytes Relative: 34.8 % (ref 12.0–46.0)
Lymphs Abs: 3.2 10*3/uL (ref 0.7–4.0)
MCHC: 33.7 g/dL (ref 30.0–36.0)
MCV: 97.8 fL (ref 78.0–100.0)
Monocytes Absolute: 0.6 10*3/uL (ref 0.1–1.0)
Monocytes Relative: 6.2 % (ref 3.0–12.0)
Neutro Abs: 5.3 10*3/uL (ref 1.4–7.7)
Neutrophils Relative %: 57.4 % (ref 43.0–77.0)
Platelets: 353 10*3/uL (ref 150.0–400.0)
RBC: 4.5 Mil/uL (ref 3.87–5.11)
RDW: 11.9 % (ref 11.5–15.5)
WBC: 9.2 10*3/uL (ref 4.0–10.5)

## 2023-10-01 LAB — HEMOGLOBIN A1C: Hgb A1c MFr Bld: 5.4 % (ref 4.6–6.5)

## 2023-10-01 LAB — LIPID PANEL
Cholesterol: 169 mg/dL (ref 0–200)
HDL: 55.8 mg/dL (ref >39.00)
LDL Cholesterol: 88 mg/dL (ref 0–99)
NonHDL: 112.8
Total CHOL/HDL Ratio: 3
Triglycerides: 122 mg/dL (ref 0.0–149.0)
VLDL: 24.4 mg/dL (ref 0.0–40.0)

## 2023-10-01 LAB — COMPREHENSIVE METABOLIC PANEL
ALT: 18 U/L (ref 0–35)
AST: 19 U/L (ref 0–37)
Albumin: 4.3 g/dL (ref 3.5–5.2)
Alkaline Phosphatase: 58 U/L (ref 39–117)
BUN: 13 mg/dL (ref 6–23)
CO2: 29 meq/L (ref 19–32)
Calcium: 9.6 mg/dL (ref 8.4–10.5)
Chloride: 104 meq/L (ref 96–112)
Creatinine, Ser: 0.93 mg/dL (ref 0.40–1.20)
GFR: 70.46 mL/min (ref >60.00)
Glucose, Bld: 86 mg/dL (ref 70–99)
Potassium: 4 meq/L (ref 3.5–5.1)
Sodium: 140 meq/L (ref 135–145)
Total Bilirubin: 0.4 mg/dL (ref 0.2–1.2)
Total Protein: 6.8 g/dL (ref 6.0–8.3)

## 2023-10-01 MED ORDER — VITAMIN D3 125 MCG (5000 UT) PO CAPS: 5000.0000 [IU] | ORAL_CAPSULE | Freq: Every day | ORAL | 3 refills | Status: AC

## 2023-10-01 MED ORDER — BUPROPION HCL ER (SR) 200 MG PO TB12: 200.0000 mg | ORAL_TABLET | Freq: Two times a day (BID) | ORAL | 3 refills | Status: AC

## 2023-10-01 NOTE — Assessment & Plan Note (Signed)
Well controlled, no changes to meds. Encouraged heart healthy diet such as the DASH diet and exercise as tolerated.  °

## 2023-10-01 NOTE — Assessment & Plan Note (Signed)
Ghm utd Check labs  See [facility] Health Maintenance  Topic Date Due   Cervical Cancer Screening (HPV/Pap Cotest)  11/08/2020   INFLUENZA VACCINE  07/22/2023   COVID-19 Vaccine (5 - 2023-24 season) 08/22/2023   MAMMOGRAM  03/21/2024   Colonoscopy  10/06/2026   DTaP/Tdap/Td (3 - Td or Tdap) 12/11/2029   Hepatitis C Screening  Completed   HIV Screening  Completed   Zoster Vaccines- Shingrix  Completed   HPV VACCINES  Aged Out

## 2023-10-01 NOTE — Progress Notes (Signed)
Established Patient Office Visit  Subjective   Patient ID: Ashlee Swanson, female    DOB: [birth year]  Age: 52 y.o. MRN: [id number]  Chief Complaint  Patient presents with   Annual Exam    Pt states fasting     HPI Pt is here for cpe.  No complaints      Patient Active Problem List   Diagnosis Date Noted   Polyphagia 12/03/2022   Other insomnia 10/06/2022   Pre-diabetes 09/08/2022   Depression 01/03/2020   Preventative health care 12/12/2019   Essential hypertension 11/28/2019   Abscess 06/12/2019   Need for tetanus booster 06/12/2019   Hot flashes 06/12/2019   Insulin resistance 04/12/2019   Other hyperlipidemia 12/29/2018   Vitamin D deficiency 12/12/2018   Class 1 obesity with serious comorbidity and body mass index (BMI) of 33.0 to 33.9 in adult 12/12/2018   Insomnia 05/28/2015   HOT FLASHES 12/31/2010   BUNION, RIGHT FOOT 12/31/2010   Benign neoplasm of skin 12/31/2008   Past Medical History:  Diagnosis Date   Arthritis    In the Back   Back pain    Bunion    DDD (degenerative disc disease), lumbar    High cholesterol    Hypertension    IBS (irritable bowel syndrome)    Insomnia    Kidney stone    Lactose intolerance    Periodontal disease    with gum graft   Snoring    Past Surgical History:  Procedure Laterality Date   BUNIONECTOMY Left 12/21/1997   modified [facility] bunionectony   SINUS LIFT WITH BONE GRAFT Left 02/07/2020   oral surgeon in [place]-- [facility]   Social History   Tobacco Use   Smoking status: Former    Current packs/day: 0.00    Average packs/day: 0.3 packs/day for 10.0 years (2.5 ttl pk-yrs)    Types: Cigarettes    Start date: 05/29/1994    Quit date: 05/29/2004    Years since quitting: 19.3   Smokeless tobacco: Never  Vaping Use   Vaping status: Never Used  Substance Use Topics   Alcohol use: Yes    Alcohol/week: 0.0 standard drinks of alcohol    Comment: socially   Drug use: No   Social History   Socioeconomic  History   Marital status: Married    Spouse name: Not on file   Number of children: Not on file   Years of education: Not on file   Highest education level: Not on file  Occupational History    Comment: [employer]  Tobacco Use   Smoking status: Former    Current packs/day: 0.00    Average packs/day: 0.3 packs/day for 10.0 years (2.5 ttl pk-yrs)    Types: Cigarettes    Start date: 05/29/1994    Quit date: 05/29/2004    Years since quitting: 19.3   Smokeless tobacco: Never  Vaping Use   Vaping status: Never Used  Substance and Sexual Activity   Alcohol use: Yes    Alcohol/week: 0.0 standard drinks of alcohol    Comment: socially   Drug use: No   Sexual activity: Yes    Partners: Male  Other Topics Concern   Not on file  Social History Narrative   Exercise- 4 days a week   Social Determinants of Health   Financial Resource Strain: Not on file  Food Insecurity: Not on file  Transportation Needs: Not on file  Physical Activity: Not on file  Stress: Not on file  Social Connections: Not on file  Intimate Partner Violence: Not on file   Family Status  Relation Name Status   Mother  Alive   Father  Alive   Brother  Alive   MGM  (Not Specified)   PGF  (Not Specified)   Mat Aunt  (Not Specified)   Pat Aunt  Deceased   Other  (Not Specified)   Neg Hx  (Not Specified)  No partnership data on file   Family History  Problem Relation Age of Onset   Hypertension Mother    Frontotemporal dementia Mother    Colon polyps Father    Hypertension Father    Hypertension Brother    Stroke Maternal Grandmother    Stroke Paternal Grandfather    Breast cancer Maternal Aunt    Colon cancer Paternal Aunt        later in life, died at age 63   Heart disease Other    Esophageal cancer Neg Hx    Rectal cancer Neg Hx    Stomach cancer Neg Hx    Allergies  Allergen Reactions   Minocycline Hives   Codeine Nausea Only    Abdominal pain, shakes   Tetracycline Hives       Review of Systems  Constitutional:  Negative for chills, fever and malaise/fatigue.  HENT:  Negative for congestion and hearing loss.   Eyes:  Negative for blurred vision and discharge.  Respiratory:  Negative for cough, sputum production and shortness of breath.   Cardiovascular:  Negative for chest pain, palpitations and leg swelling.  Gastrointestinal:  Negative for abdominal pain, blood in stool, constipation, diarrhea, heartburn, nausea and vomiting.  Genitourinary:  Negative for dysuria, frequency, hematuria and urgency.  Musculoskeletal:  Negative for back pain, falls and myalgias.  Skin:  Negative for rash.  Neurological:  Negative for dizziness, sensory change, loss of consciousness, weakness and headaches.  Endo/Heme/Allergies:  Negative for environmental allergies. Does not bruise/bleed easily.  Psychiatric/Behavioral:  Negative for depression and suicidal ideas. The patient is not nervous/anxious and does not have insomnia.       Objective:     BP 120/80 (BP Location: Left Arm, Patient Position: Sitting, Cuff Size: Large)   Pulse 96   Temp 98.5 F (36.9 C) (Oral)   Resp 18   Ht 5' (1.524 m)   Wt 194 lb 3.2 oz (88.1 kg)   SpO2 98%   BMI 37.93 kg/m  BP Readings from Last 3 Encounters:  10/01/23 120/80  12/03/22 127/77  11/03/22 112/77   Wt Readings from Last 3 Encounters:  10/01/23 194 lb 3.2 oz (88.1 kg)  12/03/22 172 lb (78 kg)  11/03/22 173 lb (78.5 kg)   SpO2 Readings from Last 3 Encounters:  10/01/23 98%  12/03/22 98%  11/03/22 97%      Physical Exam Vitals and nursing note reviewed.  Constitutional:      General: She is not in acute distress.    Appearance: Normal appearance. She is well-developed.  HENT:     Head: Normocephalic and atraumatic.     Right Ear: Tympanic membrane, ear canal and external ear normal. There is no impacted cerumen.     Left Ear: Tympanic membrane, ear canal and external ear normal. There is no impacted cerumen.      Nose: Nose normal.     Mouth/Throat:     Mouth: Mucous membranes are moist.     Pharynx: Oropharynx is clear. No oropharyngeal exudate or posterior oropharyngeal erythema.  Eyes:  General: No scleral icterus.       Right eye: No discharge.        Left eye: No discharge.     Conjunctiva/sclera: Conjunctivae normal.     Pupils: Pupils are equal, round, and reactive to light.  Neck:     Thyroid: No thyromegaly or thyroid tenderness.     Vascular: No JVD.  Cardiovascular:     Rate and Rhythm: Normal rate and regular rhythm.     Heart sounds: Normal heart sounds. No murmur heard. Pulmonary:     Effort: Pulmonary effort is normal. No respiratory distress.     Breath sounds: Normal breath sounds.  Abdominal:     General: Bowel sounds are normal. There is no distension.     Palpations: Abdomen is soft. There is no mass.     Tenderness: There is no abdominal tenderness. There is no guarding or rebound.  Genitourinary:    Vagina: Normal.  Musculoskeletal:        General: Normal range of motion.     Cervical back: Normal range of motion and neck supple.     Right lower leg: No edema.     Left lower leg: No edema.  Lymphadenopathy:     Cervical: No cervical adenopathy.  Skin:    General: Skin is warm and dry.     Findings: No erythema or rash.  Neurological:     Mental Status: She is alert and oriented to person, place, and time.     Cranial Nerves: No cranial nerve deficit.     Deep Tendon Reflexes: Reflexes are normal and symmetric.  Psychiatric:        Mood and Affect: Mood normal.        Behavior: Behavior normal.        Thought Content: Thought content normal.        Judgment: Judgment normal.      No results found for any visits on 10/01/23.  Last CBC Lab Results  Component Value Date   WBC 7.5 09/15/2022   HGB 14.3 09/15/2022   HCT 41.0 09/15/2022   MCV 97.4 09/15/2022   RDW 11.8 09/15/2022   PLT 321.0 09/15/2022   Last metabolic panel Lab Results   Component Value Date   GLUCOSE 77 09/15/2022   NA 140 09/15/2022   K 4.1 09/15/2022   CL 103 09/15/2022   CO2 30 09/15/2022   BUN 20 09/15/2022   CREATININE 1.01 09/15/2022   GFR 64.29 09/15/2022   CALCIUM 9.7 09/15/2022   PROT 7.0 09/15/2022   ALBUMIN 4.2 09/15/2022   LABGLOB 2.8 03/10/2022   AGRATIO 1.6 03/10/2022   BILITOT 0.4 09/15/2022   ALKPHOS 59 09/15/2022   AST 13 09/15/2022   ALT 11 09/15/2022   Last lipids Lab Results  Component Value Date   CHOL 178 09/15/2022   HDL 53.00 09/15/2022   LDLCALC 103 (H) 09/15/2022   TRIG 111.0 09/15/2022   CHOLHDL 3 09/15/2022   Last hemoglobin A1c Lab Results  Component Value Date   HGBA1C 4.9 03/10/2022   Last thyroid functions Lab Results  Component Value Date   TSH 1.87 09/15/2022   Last vitamin D Lab Results  Component Value Date   VD25OH 76.80 09/15/2022   Last vitamin B12 and Folate Lab Results  Component Value Date   VITAMINB12 280 06/12/2021   FOLATE 15.6 09/07/2018      The 10-year ASCVD risk score (Arnett DK, et al., 2019) is: 1.2%    Assessment &  Plan:   Problem List Items Addressed This Visit       Unprioritized   Depression   Relevant Medications   buPROPion (WELLBUTRIN SR) 200 MG 12 hr tablet   Pre-diabetes   Relevant Orders   Hemoglobin A1c   Vitamin D deficiency   Relevant Medications   Cholecalciferol (VITAMIN D3) 125 MCG (5000 UT) CAPS   Other Relevant Orders   VITAMIN D 25 Hydroxy (Vit-D Deficiency, Fractures)   Preventative health care - Primary    Ghm utd Check labs  See AVS Health Maintenance  Topic Date Due   Cervical Cancer Screening (HPV/Pap Cotest)  11/08/2020   INFLUENZA VACCINE  07/22/2023   COVID-19 Vaccine (5 - 2023-24 season) 08/22/2023   MAMMOGRAM  03/21/2024   Colonoscopy  10/06/2026   DTaP/Tdap/Td (3 - Td or Tdap) 12/11/2029   Hepatitis C Screening  Completed   HIV Screening  Completed   Zoster Vaccines- Shingrix  Completed   HPV VACCINES  Aged Out          Relevant Orders   CBC with Differential/Platelet   Comprehensive metabolic panel   Lipid panel   TSH   VITAMIN D 25 Hydroxy (Vit-D Deficiency, Fractures)   Other hyperlipidemia    Encourage heart healthy diet such as MIND or DASH diet, increase exercise, avoid trans fats, simple carbohydrates and processed foods, consider a krill or fish or flaxseed oil cap daily.        Relevant Orders   CBC with Differential/Platelet   Comprehensive metabolic panel   Lipid panel   Essential hypertension    Well controlled, no changes to meds. Encouraged heart healthy diet such as the DASH diet and exercise as tolerated.        Relevant Orders   CBC with Differential/Platelet   Comprehensive metabolic panel   Other Visit Diagnoses     Need for influenza vaccination       Relevant Orders   Flu vaccine trivalent PF, 6mos and older(Flulaval,Afluria,Fluarix,Fluzone)   Bilateral hearing loss, unspecified hearing loss type       Relevant Orders   Ambulatory referral to Audiology                            No follow-ups on file.    Donato Schultz, DO

## 2023-10-01 NOTE — Assessment & Plan Note (Signed)
Encourage heart healthy diet such as MIND or DASH diet, increase exercise, avoid trans fats, simple carbohydrates and processed foods, consider a krill or fish or flaxseed oil cap daily.  °

## 2023-12-27 ENCOUNTER — Other Ambulatory Visit (HOSPITAL_COMMUNITY): Payer: Self-pay

## 2024-05-16 ENCOUNTER — Other Ambulatory Visit (HOSPITAL_COMMUNITY): Payer: Self-pay

## 2024-10-03 ENCOUNTER — Encounter: Payer: 59 | Admitting: Family Medicine

## 2024-11-20 LAB — HM PAP SMEAR: HM Pap smear: NORMAL

## 2024-11-20 LAB — HM MAMMOGRAPHY

## 2024-11-22 ENCOUNTER — Encounter: Payer: Self-pay | Admitting: Family Medicine

## 2024-12-05 ENCOUNTER — Encounter: Payer: Self-pay | Admitting: Family Medicine

## 2024-12-05 ENCOUNTER — Ambulatory Visit: Admitting: Family Medicine

## 2024-12-05 VITALS — BP 130/90 | HR 86 | Temp 98.0°F | Resp 16 | Ht 60.0 in | Wt 203.8 lb

## 2024-12-05 DIAGNOSIS — R7303 Prediabetes: Secondary | ICD-10-CM

## 2024-12-05 DIAGNOSIS — Z0000 Encounter for general adult medical examination without abnormal findings: Secondary | ICD-10-CM

## 2024-12-05 DIAGNOSIS — I10 Essential (primary) hypertension: Secondary | ICD-10-CM

## 2024-12-05 DIAGNOSIS — E7849 Other hyperlipidemia: Secondary | ICD-10-CM

## 2024-12-05 DIAGNOSIS — E559 Vitamin D deficiency, unspecified: Secondary | ICD-10-CM

## 2024-12-05 DIAGNOSIS — Z23 Encounter for immunization: Secondary | ICD-10-CM

## 2024-12-05 DIAGNOSIS — D229 Melanocytic nevi, unspecified: Secondary | ICD-10-CM | POA: Diagnosis not present

## 2024-12-05 LAB — CBC WITH DIFFERENTIAL/PLATELET
Basophils Absolute: 0 K/uL (ref 0.0–0.1)
Basophils Relative: 0.5 % (ref 0.0–3.0)
Eosinophils Absolute: 0.1 K/uL (ref 0.0–0.7)
Eosinophils Relative: 0.9 % (ref 0.0–5.0)
HCT: 43.3 % (ref 36.0–46.0)
Hemoglobin: 14.8 g/dL (ref 12.0–15.0)
Lymphocytes Relative: 41.2 % (ref 12.0–46.0)
Lymphs Abs: 3.4 K/uL (ref 0.7–4.0)
MCHC: 34.1 g/dL (ref 30.0–36.0)
MCV: 97.5 fl (ref 78.0–100.0)
Monocytes Absolute: 0.6 K/uL (ref 0.1–1.0)
Monocytes Relative: 7 % (ref 3.0–12.0)
Neutro Abs: 4.2 K/uL (ref 1.4–7.7)
Neutrophils Relative %: 50.4 % (ref 43.0–77.0)
Platelets: 363 K/uL (ref 150.0–400.0)
RBC: 4.44 Mil/uL (ref 3.87–5.11)
RDW: 12.1 % (ref 11.5–15.5)
WBC: 8.3 K/uL (ref 4.0–10.5)

## 2024-12-05 LAB — COMPREHENSIVE METABOLIC PANEL WITH GFR
ALT: 19 U/L (ref 3–35)
AST: 16 U/L (ref 5–37)
Albumin: 4.4 g/dL (ref 3.5–5.2)
Alkaline Phosphatase: 70 U/L (ref 39–117)
BUN: 19 mg/dL (ref 6–23)
CO2: 29 meq/L (ref 19–32)
Calcium: 9.9 mg/dL (ref 8.4–10.5)
Chloride: 104 meq/L (ref 96–112)
Creatinine, Ser: 0.94 mg/dL (ref 0.40–1.20)
GFR: 68.99 mL/min (ref >60.00)
Glucose, Bld: 90 mg/dL (ref 70–99)
Potassium: 4.6 meq/L (ref 3.5–5.1)
Sodium: 140 meq/L (ref 135–145)
Total Bilirubin: 0.4 mg/dL (ref 0.2–1.2)
Total Protein: 7.3 g/dL (ref 6.0–8.3)

## 2024-12-05 LAB — LIPID PANEL
Cholesterol: 220 mg/dL — ABNORMAL HIGH (ref 28–200)
HDL: 56.5 mg/dL (ref >39.00)
LDL Cholesterol: 141 mg/dL — ABNORMAL HIGH (ref 10–99)
NonHDL: 163.82
Total CHOL/HDL Ratio: 4
Triglycerides: 114 mg/dL (ref 10.0–149.0)
VLDL: 22.8 mg/dL (ref 0.0–40.0)

## 2024-12-05 LAB — VITAMIN D 25 HYDROXY (VIT D DEFICIENCY, FRACTURES): VITD: 34.09 ng/mL (ref 30.00–100.00)

## 2024-12-05 LAB — TSH: TSH: 2.47 u[IU]/mL (ref 0.35–5.50)

## 2024-12-05 NOTE — Assessment & Plan Note (Signed)
 Encourage heart healthy diet such as MIND or DASH diet, increase exercise, avoid trans fats, simple carbohydrates and processed foods, consider a krill or fish or flaxseed oil cap daily.

## 2024-12-05 NOTE — Patient Instructions (Signed)
 Preventive Care 54-54 Years Old, Female  Preventive care refers to lifestyle choices and visits with your health care provider that can promote health and wellness. Preventive care visits are also called wellness exams.  What can I expect for my preventive care visit?  Counseling  Your health care provider may ask you questions about your:  Medical history, including:  Past medical problems.  Family medical history.  Pregnancy history.  Current health, including:  Menstrual cycle.  Method of birth control.  Emotional well-being.  Home life and relationship well-being.  Sexual activity and sexual health.  Lifestyle, including:  Alcohol, nicotine or tobacco, and drug use.  Access to firearms.  Diet, exercise, and sleep habits.  Work and work Astron[employer].  Sunscreen use.  Safety issues such as seatbelt and bike helmet use.  Physical exam  Your health care provider will check your:  Height and weight. These may be used to calculate your BMI (body mass index). BMI is a measurement that tells if you are at a healthy weight.  Waist circumference. This measures the distance around your waistline. This measurement also tells if you are at a healthy weight and may help predict your risk of certain diseases, such as type 2 diabetes and high blood pressure.  Heart rate and blood pressure.  Body temperature.  Skin for abnormal spots.  What immunizations do I need?    Vaccines are usually given at various ages, according to a schedule. Your health care provider will recommend vaccines for you based on your age, medical history, and lifestyle or other factors, such as travel or where you work.  What tests do I need?  Screening  Your health care provider may recommend screening tests for certain conditions. This may include:  Lipid and cholesterol levels.  Diabetes screening. This is done by checking your blood sugar (glucose) after you have not eaten for a while (fasting).  Pelvic exam and Pap test.  Hepatitis B test.  Hepatitis C  test.  HIV (human immunodeficiency virus) test.  STI (sexually transmitted infection) testing, if you are at risk.  Lung cancer screening.  Colorectal cancer screening.  Mammogram. Talk with your health care provider about when you should start having regular mammograms. This may depend on whether you have a family history of breast cancer.  BRCA-related cancer screening. This may be done if you have a family history of breast, ovarian, tubal, or peritoneal cancers.  Bone density scan. This is done to screen for osteoporosis.  Talk with your health care provider about your test results, treatment options, and if necessary, the need for more tests.  Follow these instructions at home:  Eating and drinking    Eat a diet that includes fresh fruits and vegetables, whole grains, lean protein, and low-fat dairy products.  Take vitamin and mineral supplements as recommended by your health care provider.  Do not drink alcohol if:  Your health care provider tells you not to drink.  You are pregnant, may be pregnant, or are planning to become pregnant.  If you drink alcohol:  Limit how much you have to 0-1 drink a day.  Know how much alcohol is in your drink. In the [place] one drink equals one 12 oz bottle of beer (355 mL), one 5 oz glass of wine (148 mL), or one 1 oz glass of hard liquor (44 mL).  Lifestyle  Brush your teeth every morning and night with fluoride toothpaste. Floss one time each day.  Exercise for at least  30 minutes 5 or more days each week.  Do not use any products that contain nicotine or tobacco. These products include cigarettes, chewing tobacco, and vaping devices, such as e-cigarettes. If you need help quitting, ask your health care provider.  Do not use drugs.  If you are sexually active, practice safe sex. Use a condom or other form of protection to prevent STIs.  If you do not wish to become pregnant, use a form of birth control. If you plan to become pregnant, see your health care provider for a  prepregnancy visit.  Take aspirin only as told by your health care provider. Make sure that you understand how much to take and what form to take. Work with your health care provider to find out whether it is safe and beneficial for you to take aspirin daily.  Find healthy ways to manage stress, such as:  Meditation, yoga, or listening to music.  Journaling.  Talking to a trusted person.  Spending time with friends and family.  Minimize exposure to UV radiation to reduce your risk of skin cancer.  Safety  Always wear your seat belt while driving or riding in a vehicle.  Do not drive:  If you have been drinking alcohol. Do not ride with someone who has been drinking.  When you are tired or distracted.  While texting.  If you have been using any mind-altering substances or drugs.  Wear a helmet and other protective equipment during sports activities.  If you have firearms in your house, make sure you follow all gun safety procedures.  Seek help if you have been physically or sexually abused.  What's next?  Visit your health care provider once a year for an annual wellness visit.  Ask your health care provider how often you should have your eyes and teeth checked.  Stay up to date on all vaccines.  This information is not intended to replace advice given to you by your health care provider. Make sure you discuss any questions you have with your health care provider.  Document Revised: 06/04/2021 Document Reviewed: 06/04/2021  Elsevier Patient Education  2024 [employer].

## 2024-12-05 NOTE — Progress Notes (Signed)
 Subjective:    Patient ID: Ashlee Swanson, female    DOB: [birth year], 53 y.o.   MRN: [id number]  Chief Complaint  Patient presents with   Annual Exam    Pt states fasting     HPI Patient is in today for cpe.  Discussed the use of AI scribe software for clinical note transcription with the patient, who gave verbal consent to proceed.  History of Present Illness Ashlee Swanson is a 53 year old female who presents for an annual physical exam.  She recently underwent a Pap smear and mammogram, both of which were normal. Hormonal tests showed a result of '60% in postmenopausal,' but the patient is unsure of the exact meaning and has not contacted her gynecologist for clarification. She no longer experiences hot flashes, which were prevalent in her late forties, and her sleep has improved, though she occasionally takes Benadryl three times a week to aid sleep.  She stopped taking Wellbutrin  but is considering resuming it due to feeling down with the colder weather. She has not been consistent with her vitamin D  intake and plans to organize her medication better.  Her mother had a stroke over the summer, which has worsened her dementia, specifically frontotemporal lobe dementia. Her mother is under the Alzheimer's umbrella and is forgetting basic tasks. Her father assists her mother at home, but there are concerns about her ability to manage finances and personal care. She and her brother are actively involved in supporting their parents.  She has some concerns about her own cognitive health, noting difficulty finding words and increased stress, which she fears may be early signs of dementia similar to her mother's condition.  She has some moles and skin concerns and is seeking a dermatologist for evaluation, especially after a friend was diagnosed with melanoma. She has obtained over-the-counter hearing aids, which help in crowded places, but she has not pursued further evaluation  for her hearing.  She has not received a COVID-19 vaccine in the past couple of years.    Past Medical History:  Diagnosis Date   Arthritis    In the Back   Back pain    Bunion    DDD (degenerative disc disease), lumbar    High cholesterol    Hypertension    IBS (irritable bowel syndrome)    Insomnia    Kidney stone    Lactose intolerance    Periodontal disease    with gum graft   Snoring     Past Surgical History:  Procedure Laterality Date   BUNIONECTOMY Left 12/21/1997   modified [facility] bunionectony   SINUS LIFT WITH BONE GRAFT Left 02/07/2020   oral surgeon in [place]-- [facility]    Family History  Problem Relation Age of Onset   Stroke Mother    Hypertension Mother    Frontotemporal dementia Mother    Dementia Mother    Colon polyps Father    Hypertension Father    Hypertension Brother    Stroke Maternal Grandmother    Stroke Paternal Grandfather    Breast cancer Maternal Aunt    Colon cancer Paternal Aunt        later in life, died at age 27   Heart disease Other    Esophageal cancer Neg Hx    Rectal cancer Neg Hx    Stomach cancer Neg Hx     Social History   Socioeconomic History   Marital status: Married    Spouse name: Not on file  Number of children: Not on file   Years of education: Not on file   Highest education level: Not on file  Occupational History    Comment: [employer]  Tobacco Use   Smoking status: Former    Current packs/day: 0.00    Average packs/day: 0.3 packs/day for 10.0 years (2.5 ttl pk-yrs)    Types: Cigarettes    Start date: 05/29/1994    Quit date: 05/29/2004    Years since quitting: 20.5   Smokeless tobacco: Never  Vaping Use   Vaping status: Never Used  Substance and Sexual Activity   Alcohol use: Yes    Alcohol/week: 0.0 standard drinks of alcohol    Comment: socially   Drug use: No   Sexual activity: Yes    Partners: Male  Other Topics Concern   Not on file  Social History Narrative   Exercise- 4 days a  week   Social Drivers of Health   Tobacco Use: Medium Risk (12/05/2024)   Patient History    Smoking Tobacco Use: Former    Smokeless Tobacco Use: Never    Passive Exposure: Not on Actuary Strain: Not on file  Food Insecurity: Not on file  Transportation Needs: Not on file  Physical Activity: Not on file  Stress: Not on file  Social Connections: Not on file  Intimate Partner Violence: Not on file  Depression (PHQ2-9): Low Risk (12/05/2024)   Depression (PHQ2-9)    PHQ-2 Score: 0  Alcohol Screen: Not on file  Housing: Not on file  Utilities: Not on file  Health Literacy: Not on file    Outpatient Medications Prior to Visit  Medication Sig Dispense Refill   buPROPion  (WELLBUTRIN  SR) 200 MG 12 hr tablet Take 1 tablet (200 mg total) by mouth 2 (two) times daily. 180 tablet 3   Cholecalciferol (VITAMIN D3) 125 MCG (5000 UT) CAPS Take 1 capsule (5,000 Units total) by mouth daily. 30 capsule 3   Melatonin 5 MG CAPS Take by mouth at bedtime as needed.     No facility-administered medications prior to visit.    Allergies[1]  Review of Systems  Constitutional:  Negative for chills, fever and malaise/fatigue.  HENT:  Negative for congestion and hearing loss.   Eyes:  Negative for blurred vision and discharge.  Respiratory:  Negative for cough, sputum production and shortness of breath.   Cardiovascular:  Negative for chest pain, palpitations and leg swelling.  Gastrointestinal:  Negative for abdominal pain, blood in stool, constipation, diarrhea, heartburn, nausea and vomiting.  Genitourinary:  Negative for dysuria, frequency, hematuria and urgency.  Musculoskeletal:  Negative for back pain, falls and myalgias.  Skin:  Negative for rash.  Neurological:  Negative for dizziness, sensory change, loss of consciousness, weakness and headaches.  Endo/Heme/Allergies:  Negative for environmental allergies. Does not bruise/bleed easily.  Psychiatric/Behavioral:  Negative  for depression and suicidal ideas. The patient is not nervous/anxious and does not have insomnia.        Objective:    Physical Exam Vitals and nursing note reviewed.  Constitutional:      General: She is not in acute distress.    Appearance: Normal appearance. She is well-developed.  HENT:     Head: Normocephalic and atraumatic.     Right Ear: Tympanic membrane, ear canal and external ear normal. There is no impacted cerumen.     Left Ear: Tympanic membrane, ear canal and external ear normal. There is no impacted cerumen.     Nose:  Nose normal.     Mouth/Throat:     Mouth: Mucous membranes are moist.     Pharynx: Oropharynx is clear. No oropharyngeal exudate or posterior oropharyngeal erythema.  Eyes:     General: No scleral icterus.       Right eye: No discharge.        Left eye: No discharge.     Conjunctiva/sclera: Conjunctivae normal.     Pupils: Pupils are equal, round, and reactive to light.  Neck:     Thyroid : No thyromegaly or thyroid  tenderness.     Vascular: No JVD.  Cardiovascular:     Rate and Rhythm: Normal rate and regular rhythm.     Heart sounds: Normal heart sounds. No murmur heard. Pulmonary:     Effort: Pulmonary effort is normal. No respiratory distress.     Breath sounds: Normal breath sounds.  Abdominal:     General: Bowel sounds are normal. There is no distension.     Palpations: Abdomen is soft. There is no mass.     Tenderness: There is no abdominal tenderness. There is no guarding or rebound.  Musculoskeletal:        General: Normal range of motion.     Cervical back: Normal range of motion and neck supple.     Right lower leg: No edema.     Left lower leg: No edema.  Lymphadenopathy:     Cervical: No cervical adenopathy.  Skin:    General: Skin is warm and dry.     Findings: No erythema or rash.  Neurological:     Mental Status: She is alert and oriented to person, place, and time.     Cranial Nerves: No cranial nerve deficit.     Deep  Tendon Reflexes: Reflexes are normal and symmetric.  Psychiatric:        Mood and Affect: Mood normal.        Behavior: Behavior normal.        Thought Content: Thought content normal.        Judgment: Judgment normal.     BP (!) 130/90 (BP Location: Left Arm, Patient Position: Sitting, Cuff Size: Large)   Pulse 86   Temp 98 F (36.7 C) (Oral)   Resp 16   Ht 5' (1.524 m)   Wt 203 lb 12.8 oz (92.4 kg)   SpO2 96%   BMI 39.80 kg/m  Wt Readings from Last 3 Encounters:  12/05/24 203 lb 12.8 oz (92.4 kg)  10/01/23 194 lb 3.2 oz (88.1 kg)  12/03/22 172 lb (78 kg)    Diabetic Foot Exam - Simple   No data filed    Lab Results  Component Value Date   WBC 9.2 10/01/2023   HGB 14.8 10/01/2023   HCT 44.0 10/01/2023   PLT 353.0 10/01/2023   GLUCOSE 86 10/01/2023   CHOL 169 10/01/2023   TRIG 122.0 10/01/2023   HDL 55.80 10/01/2023   LDLCALC 88 10/01/2023   ALT 18 10/01/2023   AST 19 10/01/2023   NA 140 10/01/2023   K 4.0 10/01/2023   CL 104 10/01/2023   CREATININE 0.93 10/01/2023   BUN 13 10/01/2023   CO2 29 10/01/2023   TSH 1.74 10/01/2023   HGBA1C 5.4 10/01/2023    Lab Results  Component Value Date   TSH 1.74 10/01/2023   Lab Results  Component Value Date   WBC 9.2 10/01/2023   HGB 14.8 10/01/2023   HCT 44.0 10/01/2023   MCV 97.8 10/01/2023  PLT 353.0 10/01/2023   Lab Results  Component Value Date   NA 140 10/01/2023   K 4.0 10/01/2023   CO2 29 10/01/2023   GLUCOSE 86 10/01/2023   BUN 13 10/01/2023   CREATININE 0.93 10/01/2023   BILITOT 0.4 10/01/2023   ALKPHOS 58 10/01/2023   AST 19 10/01/2023   ALT 18 10/01/2023   PROT 6.8 10/01/2023   ALBUMIN 4.3 10/01/2023   CALCIUM 9.6 10/01/2023   EGFR 63 03/10/2022   GFR 70.46 10/01/2023   Lab Results  Component Value Date   CHOL 169 10/01/2023   Lab Results  Component Value Date   HDL 55.80 10/01/2023   Lab Results  Component Value Date   LDLCALC 88 10/01/2023   Lab Results  Component Value  Date   TRIG 122.0 10/01/2023   Lab Results  Component Value Date   CHOLHDL 3 10/01/2023   Lab Results  Component Value Date   HGBA1C 5.4 10/01/2023       Assessment & Plan:  Pre-diabetes  Essential hypertension -     CBC with Differential/Platelet -     Comprehensive metabolic panel with GFR -     Lipid panel -     TSH  Preventative health care -     CBC with Differential/Platelet -     Comprehensive metabolic panel with GFR -     Lipid panel -     TSH -     VITAMIN D  25 Hydroxy (Vit-D Deficiency, Fractures)  Vitamin D  deficiency -     VITAMIN D  25 Hydroxy (Vit-D Deficiency, Fractures)  Other hyperlipidemia -     Comprehensive metabolic panel with GFR -     Lipid panel  Need for influenza vaccination -     Flu vaccine trivalent PF, 6mos and older(Flulaval,Afluria,Fluarix,Fluzone)  Assessment and Plan Assessment & Plan Vitamin D  deficiency   She has not been adhering to her vitamin D  supplementation regimen. Encouraged consistent use of vitamin D  supplements.  Suspicious nevus   She has suspicious nevi with concern for potential melanoma, influenced by a friend's recent diagnosis. Referred to dermatologist Bernarda Lowers for evaluation.  General Health Maintenance   Routine health maintenance was discussed. Recent Pap smear and mammogram results were normal. Discussed flu vaccination and previous COVID-19 vaccination history. Administered flu shot today.   Jamee JONELLE Shanks Chase, DO     [1]  Allergies Allergen Reactions   Minocycline Hives   Codeine Nausea Only    Abdominal pain, shakes   Tetracycline Hives

## 2024-12-05 NOTE — Assessment & Plan Note (Signed)
 [facility] utd Check labs See [facility] Health Maintenance  Topic Date Due   Hepatitis B Vaccines 19-59 Average Risk (1 of 3 - 19+ 3-dose series) Never done   Pneumococcal Vaccine: 50+ Years (1 of 1 - PCV) Never done   COVID-19 Vaccine (5 - 2025-26 season) 12/21/2024 (Originally 08/21/2024)   Mammogram  11/21/2025   Colonoscopy  10/06/2026   Cervical Cancer Screening (HPV/Pap Cotest)  11/21/2027   DTaP/Tdap/Td (3 - Td or Tdap) 12/11/2029   Influenza Vaccine  Completed   Hepatitis C Screening  Completed   HIV Screening  Completed   Zoster Vaccines- Shingrix   Completed   HPV VACCINES  Aged Out   Meningococcal B Vaccine  Aged Out

## 2024-12-11 ENCOUNTER — Ambulatory Visit: Payer: Self-pay | Admitting: Family Medicine

## 2025-12-07 ENCOUNTER — Encounter: Admitting: Family Medicine
